# Patient Record
Sex: Female | Born: 1940
Health system: Southern US, Community
[De-identification: ages and names within clinical notes are randomized; demographics above are authoritative.]

## PROBLEM LIST (undated history)

## (undated) DIAGNOSIS — N959 Unspecified menopausal and perimenopausal disorder: Secondary | ICD-10-CM

## (undated) DIAGNOSIS — Z800 Family history of malignant neoplasm of digestive organs: Secondary | ICD-10-CM

## (undated) DIAGNOSIS — I493 Ventricular premature depolarization: Secondary | ICD-10-CM

## (undated) DIAGNOSIS — H269 Unspecified cataract: Secondary | ICD-10-CM

## (undated) DIAGNOSIS — M810 Age-related osteoporosis without current pathological fracture: Secondary | ICD-10-CM

## (undated) DIAGNOSIS — Z889 Allergy status to unspecified drugs, medicaments and biological substances status: Secondary | ICD-10-CM

## (undated) DIAGNOSIS — S82892A Other fracture of left lower leg, initial encounter for closed fracture: Secondary | ICD-10-CM

## (undated) DIAGNOSIS — I498 Other specified cardiac arrhythmias: Secondary | ICD-10-CM

## (undated) DIAGNOSIS — T7840XA Allergy, unspecified, initial encounter: Secondary | ICD-10-CM

## (undated) DIAGNOSIS — K5790 Diverticulosis of intestine, part unspecified, without perforation or abscess without bleeding: Secondary | ICD-10-CM

## (undated) DIAGNOSIS — K635 Polyp of colon: Secondary | ICD-10-CM

## (undated) DIAGNOSIS — I499 Cardiac arrhythmia, unspecified: Secondary | ICD-10-CM

## (undated) HISTORY — PX: CATARACT EXTRACTION W/ INTRAOCULAR LENS  IMPLANT, BILATERAL: SHX1307

## (undated) HISTORY — DX: Cardiac arrhythmia, unspecified: I49.9

## (undated) HISTORY — DX: Age-related osteoporosis without current pathological fracture: M81.0

## (undated) HISTORY — DX: Unspecified cataract: H26.9

## (undated) HISTORY — PX: APPENDECTOMY: SHX54

## (undated) HISTORY — DX: Other fracture of left lower leg, initial encounter for closed fracture: S82.892A

## (undated) HISTORY — DX: Allergy status to unspecified drugs, medicaments and biological substances: Z88.9

## (undated) HISTORY — DX: Ventricular premature depolarization: I49.3

## (undated) HISTORY — PX: OTHER SURGICAL HISTORY: SHX169

## (undated) HISTORY — DX: Allergy, unspecified, initial encounter: T78.40XA

## (undated) HISTORY — DX: Polyp of colon: K63.5

## (undated) HISTORY — DX: Unspecified menopausal and perimenopausal disorder: N95.9

## (undated) HISTORY — DX: Family history of malignant neoplasm of digestive organs: Z80.0

## (undated) HISTORY — DX: Other specified cardiac arrhythmias: I49.8

## (undated) HISTORY — DX: Diverticulosis of intestine, part unspecified, without perforation or abscess without bleeding: K57.90

---

## 1999-04-08 ENCOUNTER — Encounter: Payer: Self-pay | Admitting: Internal Medicine

## 1999-04-08 ENCOUNTER — Encounter: Admission: RE | Admit: 1999-04-08 | Discharge: 1999-04-08 | Payer: Self-pay | Admitting: Internal Medicine

## 1999-11-08 ENCOUNTER — Other Ambulatory Visit: Admission: RE | Admit: 1999-11-08 | Discharge: 1999-11-08 | Payer: Self-pay | Admitting: Internal Medicine

## 2000-04-11 ENCOUNTER — Encounter: Admission: RE | Admit: 2000-04-11 | Discharge: 2000-04-11 | Payer: Self-pay | Admitting: Internal Medicine

## 2000-04-11 ENCOUNTER — Encounter: Payer: Self-pay | Admitting: Internal Medicine

## 2001-04-16 ENCOUNTER — Encounter: Admission: RE | Admit: 2001-04-16 | Discharge: 2001-04-16 | Payer: Self-pay | Admitting: Internal Medicine

## 2001-04-16 ENCOUNTER — Encounter: Payer: Self-pay | Admitting: Internal Medicine

## 2002-04-22 ENCOUNTER — Ambulatory Visit (HOSPITAL_COMMUNITY): Admission: RE | Admit: 2002-04-22 | Discharge: 2002-04-22 | Payer: Self-pay | Admitting: Internal Medicine

## 2002-04-22 ENCOUNTER — Encounter: Payer: Self-pay | Admitting: Internal Medicine

## 2002-11-10 ENCOUNTER — Ambulatory Visit (HOSPITAL_COMMUNITY): Admission: RE | Admit: 2002-11-10 | Discharge: 2002-11-10 | Payer: Self-pay | Admitting: Gastroenterology

## 2002-11-10 ENCOUNTER — Encounter (INDEPENDENT_AMBULATORY_CARE_PROVIDER_SITE_OTHER): Payer: Self-pay | Admitting: Specialist

## 2003-05-27 ENCOUNTER — Ambulatory Visit (HOSPITAL_COMMUNITY): Admission: RE | Admit: 2003-05-27 | Discharge: 2003-05-27 | Payer: Self-pay | Admitting: Internal Medicine

## 2004-05-24 ENCOUNTER — Other Ambulatory Visit: Admission: RE | Admit: 2004-05-24 | Discharge: 2004-05-24 | Payer: Self-pay | Admitting: Internal Medicine

## 2004-06-01 ENCOUNTER — Ambulatory Visit (HOSPITAL_COMMUNITY): Admission: RE | Admit: 2004-06-01 | Discharge: 2004-06-01 | Payer: Self-pay | Admitting: Internal Medicine

## 2005-06-02 ENCOUNTER — Ambulatory Visit (HOSPITAL_COMMUNITY): Admission: RE | Admit: 2005-06-02 | Discharge: 2005-06-02 | Payer: Self-pay | Admitting: Internal Medicine

## 2006-06-06 ENCOUNTER — Ambulatory Visit (HOSPITAL_COMMUNITY): Admission: RE | Admit: 2006-06-06 | Discharge: 2006-06-06 | Payer: Self-pay | Admitting: *Deleted

## 2006-06-19 DIAGNOSIS — M810 Age-related osteoporosis without current pathological fracture: Secondary | ICD-10-CM

## 2006-06-19 HISTORY — DX: Age-related osteoporosis without current pathological fracture: M81.0

## 2006-06-19 LAB — HM DEXA SCAN

## 2006-08-01 ENCOUNTER — Ambulatory Visit: Payer: Self-pay | Admitting: Cardiovascular Disease

## 2006-08-02 ENCOUNTER — Ambulatory Visit (HOSPITAL_COMMUNITY): Admission: RE | Admit: 2006-08-02 | Discharge: 2006-08-02 | Payer: Self-pay | Admitting: Cardiology

## 2006-10-04 ENCOUNTER — Encounter: Admission: RE | Admit: 2006-10-04 | Discharge: 2006-10-04 | Payer: Self-pay | Admitting: *Deleted

## 2007-06-10 ENCOUNTER — Ambulatory Visit (HOSPITAL_COMMUNITY): Admission: RE | Admit: 2007-06-10 | Discharge: 2007-06-10 | Payer: Self-pay | Admitting: *Deleted

## 2007-07-01 ENCOUNTER — Other Ambulatory Visit: Admission: RE | Admit: 2007-07-01 | Discharge: 2007-07-01 | Payer: Self-pay | Admitting: *Deleted

## 2008-02-17 ENCOUNTER — Encounter: Admission: RE | Admit: 2008-02-17 | Discharge: 2008-02-17 | Payer: Self-pay | Admitting: Family Medicine

## 2008-06-10 ENCOUNTER — Ambulatory Visit (HOSPITAL_COMMUNITY): Admission: RE | Admit: 2008-06-10 | Discharge: 2008-06-10 | Payer: Self-pay | Admitting: Family Medicine

## 2008-06-23 ENCOUNTER — Encounter: Admission: RE | Admit: 2008-06-23 | Discharge: 2008-06-23 | Payer: Self-pay | Admitting: Family Medicine

## 2008-12-08 ENCOUNTER — Encounter: Admission: RE | Admit: 2008-12-08 | Discharge: 2008-12-08 | Payer: Self-pay | Admitting: Emergency Medicine

## 2009-06-24 ENCOUNTER — Ambulatory Visit (HOSPITAL_COMMUNITY): Admission: RE | Admit: 2009-06-24 | Discharge: 2009-06-24 | Payer: Self-pay | Admitting: Emergency Medicine

## 2010-02-22 ENCOUNTER — Inpatient Hospital Stay (HOSPITAL_COMMUNITY): Admission: EM | Admit: 2010-02-22 | Discharge: 2010-03-03 | Payer: Self-pay | Admitting: Emergency Medicine

## 2010-02-22 ENCOUNTER — Encounter (INDEPENDENT_AMBULATORY_CARE_PROVIDER_SITE_OTHER): Payer: Self-pay

## 2010-06-05 ENCOUNTER — Inpatient Hospital Stay (HOSPITAL_COMMUNITY): Admission: EM | Admit: 2010-06-05 | Discharge: 2010-06-11 | Payer: Self-pay | Source: Home / Self Care

## 2010-06-30 ENCOUNTER — Ambulatory Visit (HOSPITAL_COMMUNITY)
Admission: RE | Admit: 2010-06-30 | Discharge: 2010-06-30 | Payer: Self-pay | Source: Home / Self Care | Attending: Emergency Medicine | Admitting: Emergency Medicine

## 2010-08-29 LAB — BASIC METABOLIC PANEL
CO2: 29 mEq/L (ref 19–32)
Calcium: 8.5 mg/dL (ref 8.4–10.5)
Creatinine, Ser: 0.64 mg/dL (ref 0.4–1.2)
GFR calc Af Amer: >60 mL/min (ref >60)
GFR calc non Af Amer: >60 mL/min (ref >60)
Sodium: 141 mEq/L (ref 135–145)

## 2010-08-29 LAB — CBC
Hemoglobin: 11.6 g/dL — ABNORMAL LOW (ref 12.0–15.0)
Hemoglobin: 14.4 g/dL (ref 12.0–15.0)
MCH: 29.7 pg (ref 26.0–34.0)
MCHC: 32 g/dL (ref 30.0–36.0)
MCHC: 33.6 g/dL (ref 30.0–36.0)
Platelets: 207 10*3/uL (ref 150–400)
Platelets: 238 10*3/uL (ref 150–400)
RBC: 3.91 MIL/uL (ref 3.87–5.11)
RBC: 4.7 MIL/uL (ref 3.87–5.11)

## 2010-08-29 LAB — URINE MICROSCOPIC-ADD ON

## 2010-08-29 LAB — COMPREHENSIVE METABOLIC PANEL
ALT: 16 U/L (ref 0–35)
AST: 25 U/L (ref 0–37)
Albumin: 3.8 g/dL (ref 3.5–5.2)
CO2: 28 mEq/L (ref 19–32)
Calcium: 10 mg/dL (ref 8.4–10.5)
Creatinine, Ser: 0.89 mg/dL (ref 0.4–1.2)
GFR calc Af Amer: >60 mL/min (ref >60)
GFR calc non Af Amer: >60 mL/min (ref >60)
Sodium: 137 mEq/L (ref 135–145)

## 2010-08-29 LAB — DIFFERENTIAL
Eosinophils Absolute: 0.1 10*3/uL (ref 0.0–0.7)
Eosinophils Relative: 1 % (ref 0–5)
Lymphocytes Relative: 24 % (ref 12–46)
Lymphs Abs: 2.5 10*3/uL (ref 0.7–4.0)
Monocytes Absolute: 0.8 10*3/uL (ref 0.1–1.0)
Monocytes Relative: 8 % (ref 3–12)

## 2010-08-29 LAB — URINALYSIS, ROUTINE W REFLEX MICROSCOPIC
Bilirubin Urine: NEGATIVE
Glucose, UA: NEGATIVE mg/dL
Hgb urine dipstick: NEGATIVE
Ketones, ur: 15 mg/dL — AB
Nitrite: NEGATIVE
Specific Gravity, Urine: 1.013 (ref 1.005–1.030)
pH: 7 (ref 5.0–8.0)

## 2010-08-29 LAB — LIPASE, BLOOD: Lipase: 28 U/L (ref 11–59)

## 2010-08-29 LAB — PHOSPHORUS: Phosphorus: 2.9 mg/dL (ref 2.3–4.6)

## 2010-08-29 LAB — MAGNESIUM: Magnesium: 2 mg/dL (ref 1.5–2.5)

## 2010-09-01 LAB — URINALYSIS, ROUTINE W REFLEX MICROSCOPIC
Leukocytes, UA: NEGATIVE
Nitrite: NEGATIVE
Protein, ur: NEGATIVE mg/dL
Specific Gravity, Urine: 1.01 (ref 1.005–1.030)
Urobilinogen, UA: 0.2 mg/dL (ref 0.0–1.0)

## 2010-09-01 LAB — LIPASE, BLOOD: Lipase: 22 U/L (ref 11–59)

## 2010-09-01 LAB — COMPREHENSIVE METABOLIC PANEL
Albumin: 3.7 g/dL (ref 3.5–5.2)
BUN: 9 mg/dL (ref 6–23)
Calcium: 8.7 mg/dL (ref 8.4–10.5)
Chloride: 97 mEq/L (ref 96–112)
Creatinine, Ser: 0.76 mg/dL (ref 0.4–1.2)
GFR calc non Af Amer: >60 mL/min (ref >60)
Total Bilirubin: 1.6 mg/dL — ABNORMAL HIGH (ref 0.3–1.2)

## 2010-09-01 LAB — CBC
HCT: 33.7 % — ABNORMAL LOW (ref 36.0–46.0)
HCT: 34.2 % — ABNORMAL LOW (ref 36.0–46.0)
HCT: 34.3 % — ABNORMAL LOW (ref 36.0–46.0)
Hemoglobin: 11.4 g/dL — ABNORMAL LOW (ref 12.0–15.0)
Hemoglobin: 11.5 g/dL — ABNORMAL LOW (ref 12.0–15.0)
Hemoglobin: 12.5 g/dL (ref 12.0–15.0)
MCH: 30.3 pg (ref 26.0–34.0)
MCH: 30.7 pg (ref 26.0–34.0)
MCH: 30.9 pg (ref 26.0–34.0)
MCH: 31.2 pg (ref 26.0–34.0)
MCHC: 32.8 g/dL (ref 30.0–36.0)
MCHC: 33.2 g/dL (ref 30.0–36.0)
MCHC: 33.5 g/dL (ref 30.0–36.0)
MCHC: 34.1 g/dL (ref 30.0–36.0)
MCHC: 34.3 g/dL (ref 30.0–36.0)
MCV: 90.6 fL (ref 78.0–100.0)
MCV: 90.9 fL (ref 78.0–100.0)
MCV: 91.6 fL (ref 78.0–100.0)
MCV: 92.2 fL (ref 78.0–100.0)
Platelets: 476 10*3/uL — ABNORMAL HIGH (ref 150–400)
Platelets: 206 10*3/uL (ref 150–400)
Platelets: 330 10*3/uL (ref 150–400)
RBC: 3.71 MIL/uL — ABNORMAL LOW (ref 3.87–5.11)
RBC: 4.16 MIL/uL (ref 3.87–5.11)
RBC: 4.81 MIL/uL (ref 3.87–5.11)
RDW: 14.3 % (ref 11.5–15.5)
RDW: 13.8 % (ref 11.5–15.5)
RDW: 14.1 % (ref 11.5–15.5)
WBC: 6.1 10*3/uL (ref 4.0–10.5)
WBC: 11.6 10*3/uL — ABNORMAL HIGH (ref 4.0–10.5)

## 2010-09-01 LAB — BASIC METABOLIC PANEL
BUN: 2 mg/dL — ABNORMAL LOW (ref 6–23)
CO2: 24 mEq/L (ref 19–32)
CO2: 26 mEq/L (ref 19–32)
CO2: 27 mEq/L (ref 19–32)
Calcium: 6.8 mg/dL — ABNORMAL LOW (ref 8.4–10.5)
Calcium: 7.4 mg/dL — ABNORMAL LOW (ref 8.4–10.5)
Chloride: 107 mEq/L (ref 96–112)
Chloride: 107 mEq/L (ref 96–112)
Creatinine, Ser: 0.52 mg/dL (ref 0.4–1.2)
GFR calc Af Amer: >60 mL/min (ref >60)
GFR calc Af Amer: >60 mL/min (ref >60)
GFR calc non Af Amer: >60 mL/min (ref >60)
Glucose, Bld: 138 mg/dL — ABNORMAL HIGH (ref 70–99)
Glucose, Bld: 141 mg/dL — ABNORMAL HIGH (ref 70–99)
Potassium: 3.7 mEq/L (ref 3.5–5.1)
Sodium: 134 mEq/L — ABNORMAL LOW (ref 135–145)
Sodium: 136 mEq/L (ref 135–145)
Sodium: 137 mEq/L (ref 135–145)

## 2010-09-01 LAB — DIFFERENTIAL
Basophils Absolute: 0 10*3/uL (ref 0.0–0.1)
Lymphocytes Relative: 7 % — ABNORMAL LOW (ref 12–46)
Lymphs Abs: 0.9 10*3/uL (ref 0.7–4.0)
Monocytes Absolute: 0.8 10*3/uL (ref 0.1–1.0)
Neutro Abs: 10.9 10*3/uL — ABNORMAL HIGH (ref 1.7–7.7)

## 2010-09-01 LAB — URINE MICROSCOPIC-ADD ON

## 2010-09-01 LAB — GLUCOSE, CAPILLARY: Glucose-Capillary: 104 mg/dL — ABNORMAL HIGH (ref 70–99)

## 2010-09-12 ENCOUNTER — Other Ambulatory Visit: Payer: Self-pay | Admitting: Emergency Medicine

## 2010-09-12 DIAGNOSIS — M8580 Other specified disorders of bone density and structure, unspecified site: Secondary | ICD-10-CM

## 2010-11-04 NOTE — Op Note (Signed)
NAME:  Traci Woodard, Traci Woodard                         ACCOUNT NO.:  [id number]   MEDICAL RECORD NO.:  [id number]                   PATIENT TYPE:  AMB   LOCATION:  ENDO                                 FACILITY:  [facility]   PHYSICIAN:  Danise Edge, M.D.                DATE OF BIRTH:  Dec 01, 1940   DATE OF PROCEDURE:  11/10/2002  DATE OF DISCHARGE:                                 OPERATIVE REPORT   PROCEDURE PERFORMED:  Colonoscopy and polypectomy.   ENDOSCOPIST:  Charolett Bumpers, M.D.   INDICATIONS FOR PROCEDURE:  [name] is a 70 year old female born  01-30-1941.  [name] is scheduled to undergo a screening colonoscopy  with polypectomy to prevent colon cancer.  Approximately five years ago she  underwent a colonoscopy and a small neoplastic polyp was removed.   PREMEDICATION:  Versed 4 mg. Demerol 50 mg.   DESCRIPTION OF PROCEDURE:  After obtaining informed consent, [name] was  placed in the left lateral decubitus position.  I administered intravenous  Demerol and intravenous Versed to achieve conscious sedation for the  procedure.  The patient's blood pressure, oxygen saturations and cardiac  rhythm were monitored throughout the procedure and documented in the medical  record.   Anal inspection was normal.  Digital rectal examination was normal.  The  pediatric Olympus video colonoscope was introduced into the rectum and  easily advanced to the splenic flexure.  Due to sharp angulation of the  splenic flexure, the patient required placement in the prone position in  order to get around the splenic flexure and into the transverse colon.  She  was then placed in the supine position and the endoscope easily advanced to  the cecum.  A normal-appearing ileocecal valve was intubated and the distal  ileum inspected.  Colonic preparation for the exam today was excellent.   Rectum:  Normal.   Sigmoid colon and descending colon:  Normal.   Splenic flexure:  A 1 mm  sessile polyp was removed from the splenic flexure  with the hot biopsy forceps.   Transverse colon:  Normal.   Hepatic flexure:  Normal.   Descending colon:  From the distal ascending colon a 7 mm sessile polyp was  lifted by submucosal saline injection and removed in piecemeal fashion with  the electrocautery snare.   Cecum and ileocecal valve:  Normal.   Distal ileum:  Normal.   ASSESSMENT:  A 7 mm sessile polyp was removed from the distal ascending  colon and submitted for pathologic interpretation; a 1 mm sessile polyp was  removed from the splenic flexure and submitted for pathologic  interpretation.   PLAN:  Danise Edge, M.D.    MJ/MEDQ  D:  11/10/2002  T:  11/10/2002  Job:  [id number]   cc:   Darius Bump, M.D.  [phone] [address]  [place] [address]  Fax: [phone]

## 2010-12-12 ENCOUNTER — Ambulatory Visit
Admission: RE | Admit: 2010-12-12 | Discharge: 2010-12-12 | Disposition: A | Discharge status: Home / Self Care | Payer: PRIVATE HEALTH INSURANCE | Source: Ambulatory Visit | Attending: Emergency Medicine | Admitting: Emergency Medicine

## 2010-12-12 DIAGNOSIS — M8580 Other specified disorders of bone density and structure, unspecified site: Secondary | ICD-10-CM

## 2011-03-12 IMAGING — CT CT ABD-PELV W/ CM
1 of 3 series · 14 of 32 positions shown, 19 images · IV contrast (agent unspecified)
Comparison: 02/22/2010

CLINICAL DATA: Nausea, vomiting and abdominal pain.  Appendectomy
in [REDACTED].

CT ABDOMEN AND PELVIS WITH CONTRAST
TECHNIQUE: Multidetector CT imaging of the abdomen and pelvis was
performed following the standard protocol during bolus
administration of intravenous contrast.
Contrast: 100 ml Smnipaque-BSS IV

[Series 2: abd/pelv with 5.0 b31f st · axial · 0.85mm/px · z∈[-499,-129]mm · 14 of 84 slices shown, 19 images]
[im 5/84  soft-tissue]
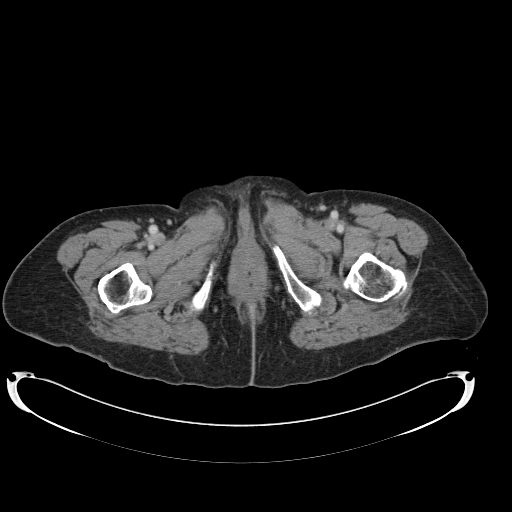
[im 5/84  bone]
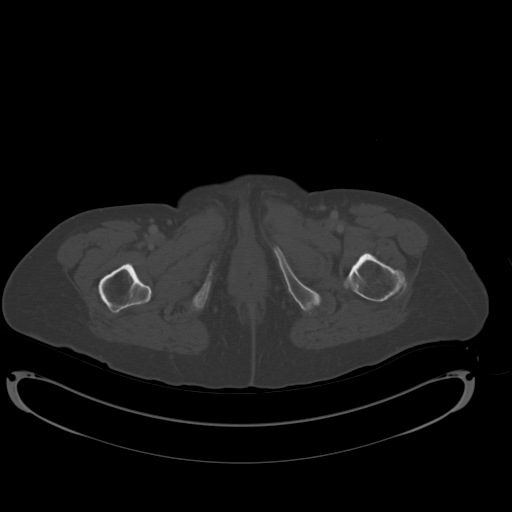
[im 14/84  soft-tissue]
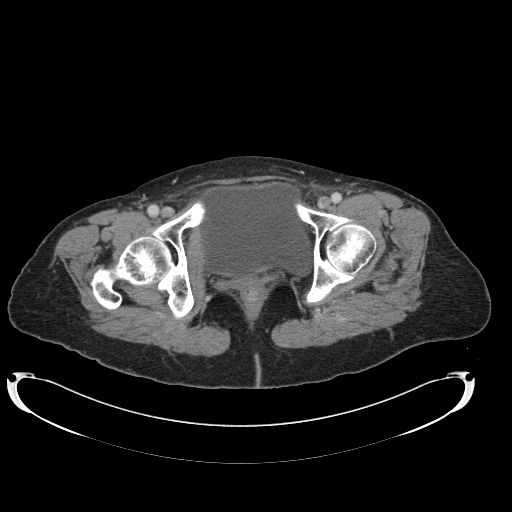
[im 18/84  soft-tissue]
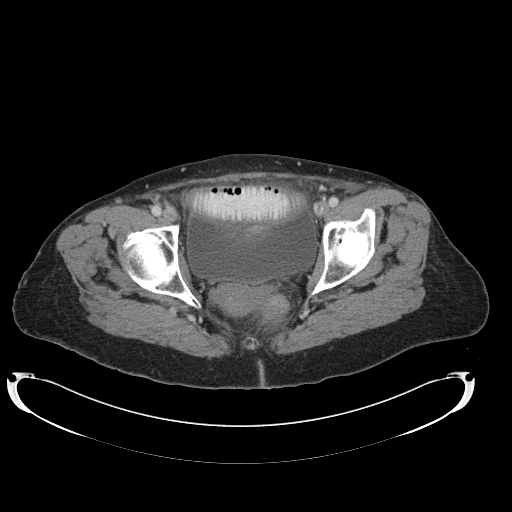
[im 22/84  soft-tissue]
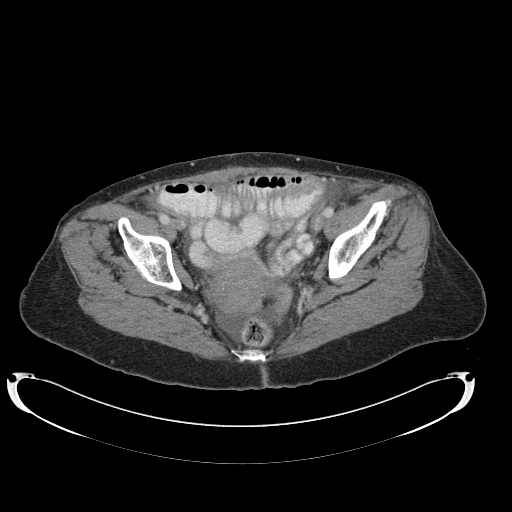
[im 31/84  soft-tissue]
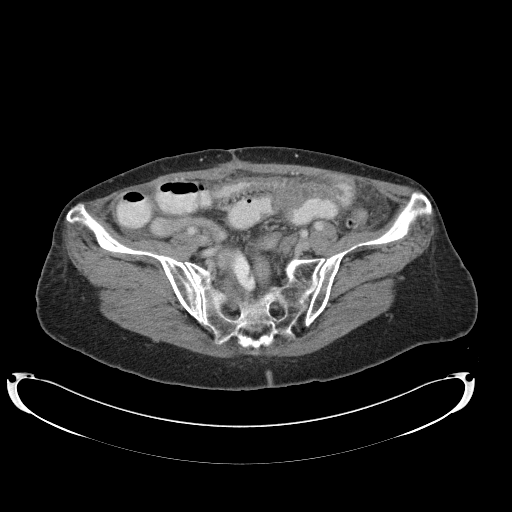
[im 35/84  soft-tissue]
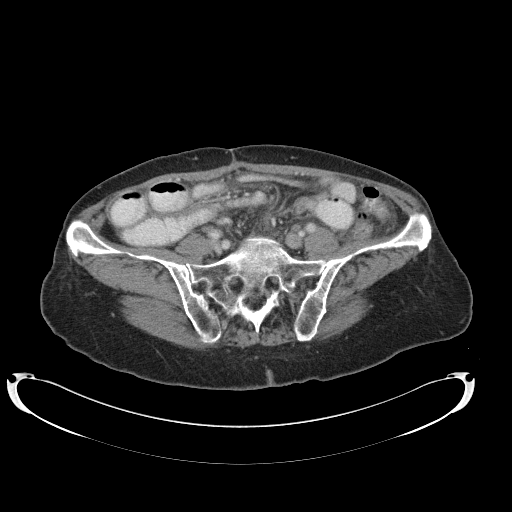
[im 44/84  soft-tissue]
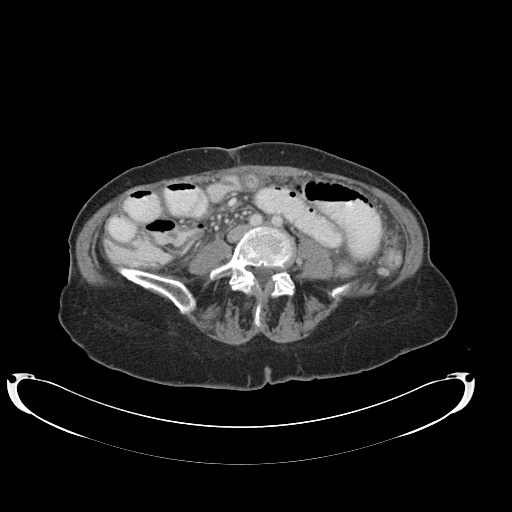
[im 49/84  soft-tissue]
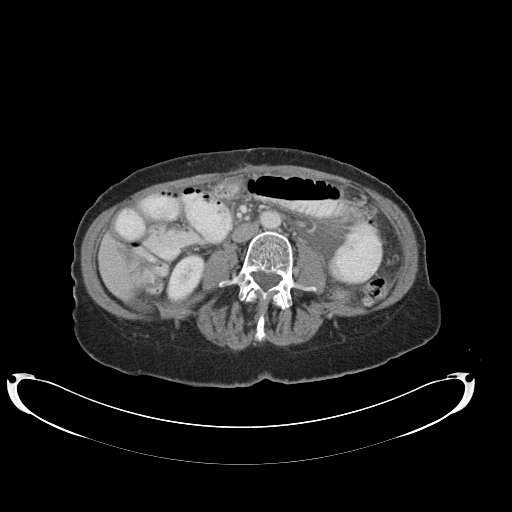
[im 53/84  soft-tissue]
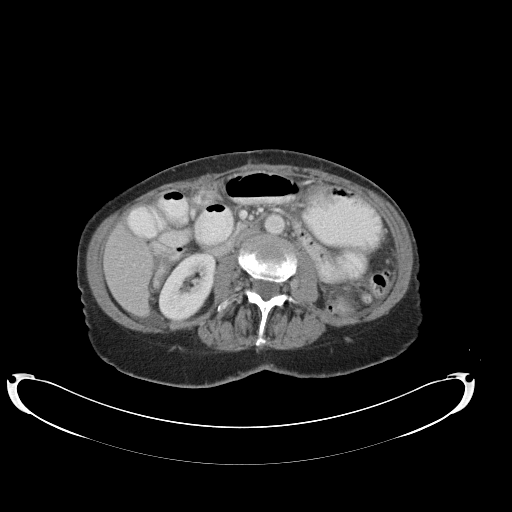
[im 53/84  bone]
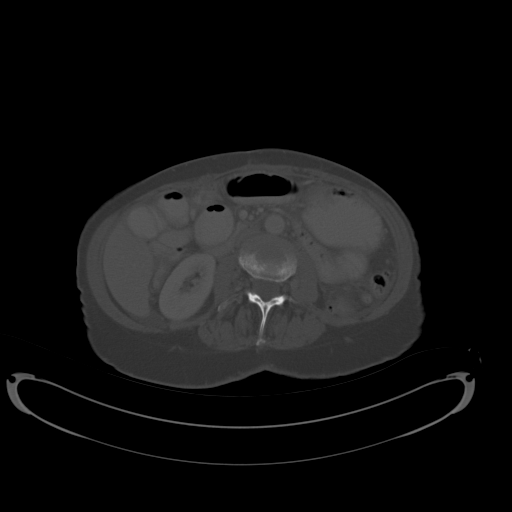
[im 62/84  soft-tissue]
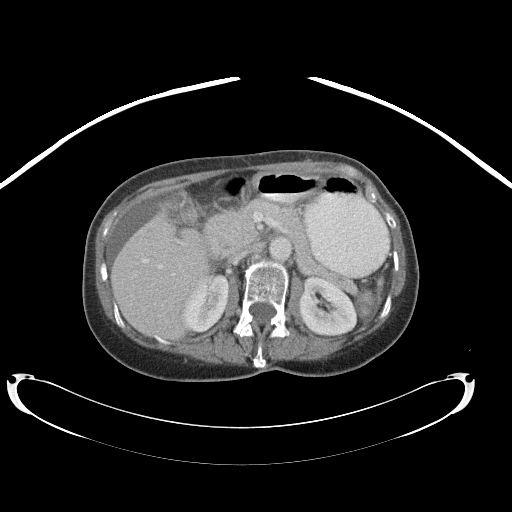
[im 66/84  soft-tissue]
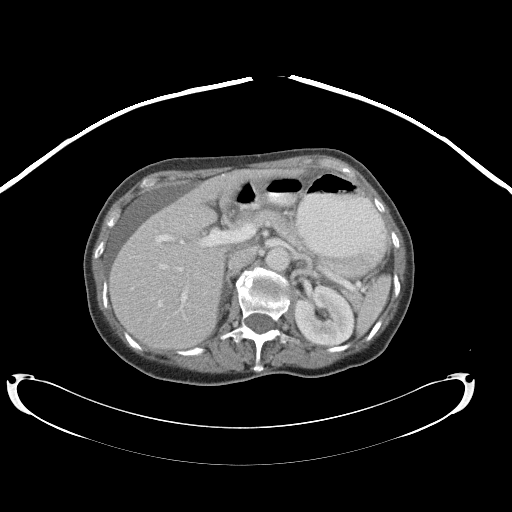
[im 66/84  lung]
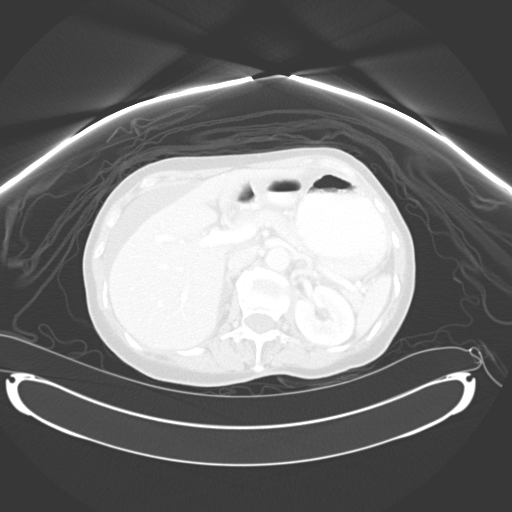
[im 70/84  soft-tissue]
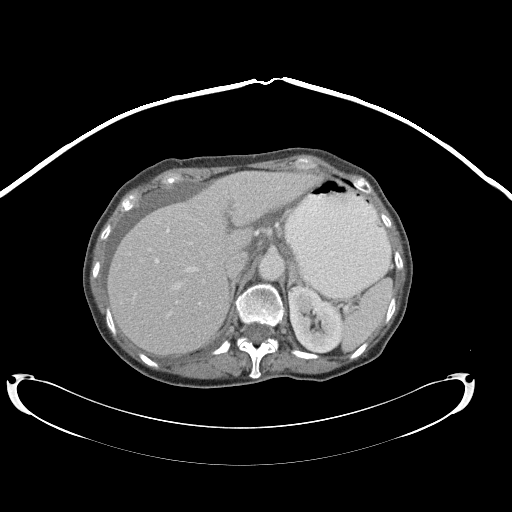
[im 70/84  lung]
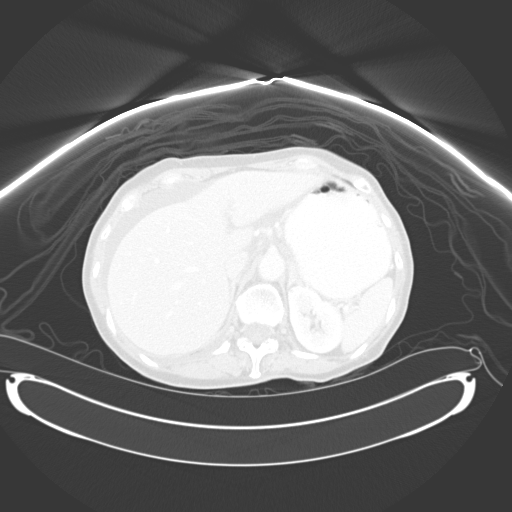
[im 75/84  lung]
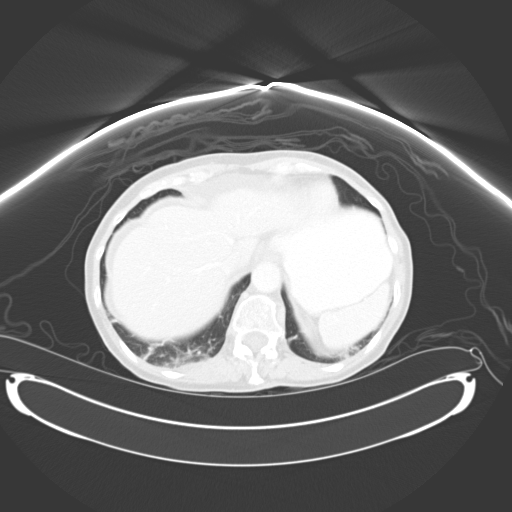
[im 79/84  soft-tissue]
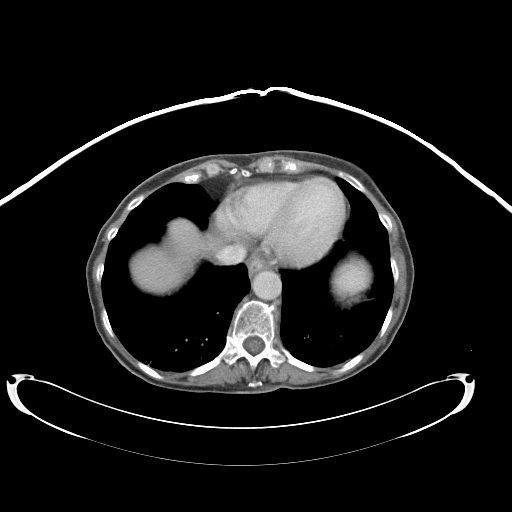
[im 79/84  lung]
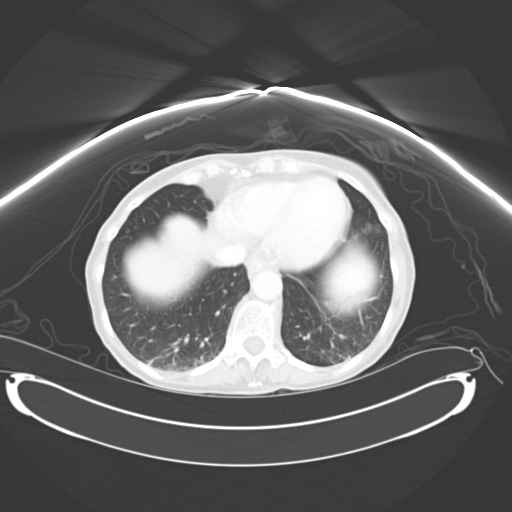

[14 of 32 positions shown; findings below may reference images not displayed]

FINDINGS: There is evidence of significant small bowel obstruction
with multiple dilated small bowel loops present primarily at the
level of the proximal to mid jejunum.  Maximal caliber of small
bowel approaches 4 cm.  Distal small bowel is of normal caliber.
Difficult to define exact transition point, but it is suspected to
be located in the lower midline pelvis.

Associated free fluid in the peritoneal cavity without focal
abscess.  No evidence of overt pneumatosis or free air.  No hernias
are identified.

The liver, gallbladder, pancreas, spleen, adrenal glands and
kidneys are unremarkable.  No abnormal calcifications.  The bladder
has a normal appearance.  Spondylosis and lumbar disc protrusions
present.
IMPRESSION: Significant small bowel obstruction with transition point at the
level of the mid to distal jejunum.  Associated free fluid in the
peritoneal cavity without focal abscess.  No evidence of bowel
perforation.

## 2011-03-12 IMAGING — CR DG ABDOMEN ACUTE W/ 1V CHEST
3 series · 3 of 3 positions shown · non-contrast
Comparison: None.

CLINICAL DATA: Lower abdominal pain, nausea, vomiting.

ACUTE ABDOMEN SERIES (ABDOMEN 2 VIEW & CHEST 1 VIEW)

[w chest pa]
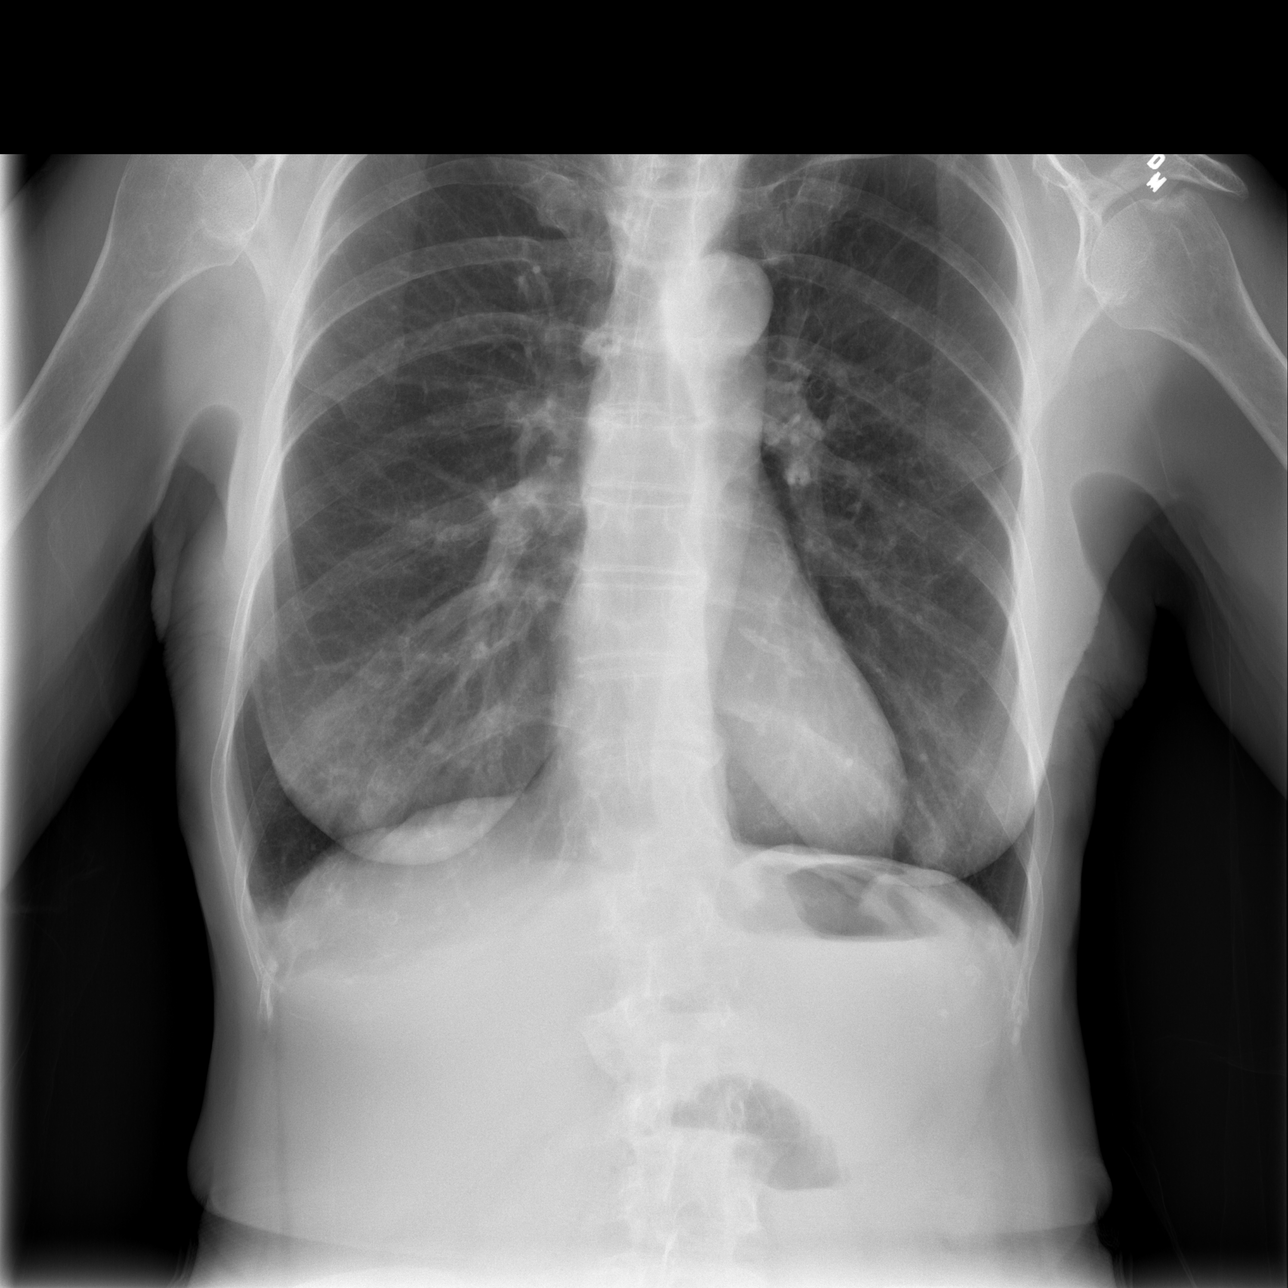

[w abdomen upright]
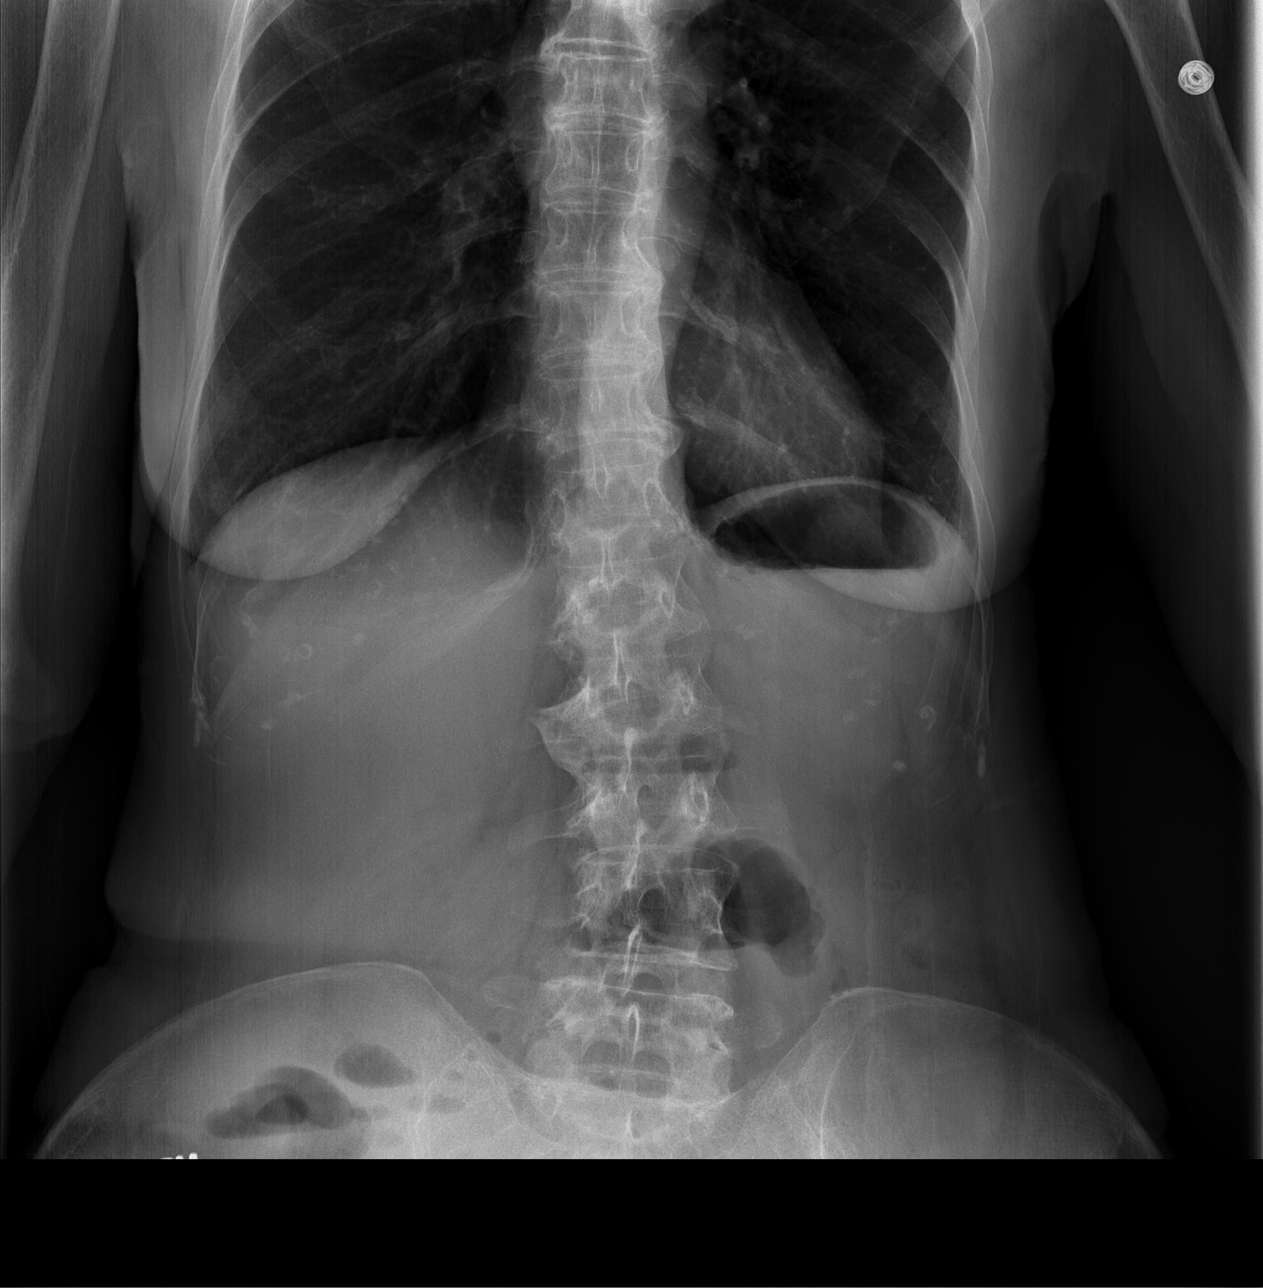

[t abdomen supine]
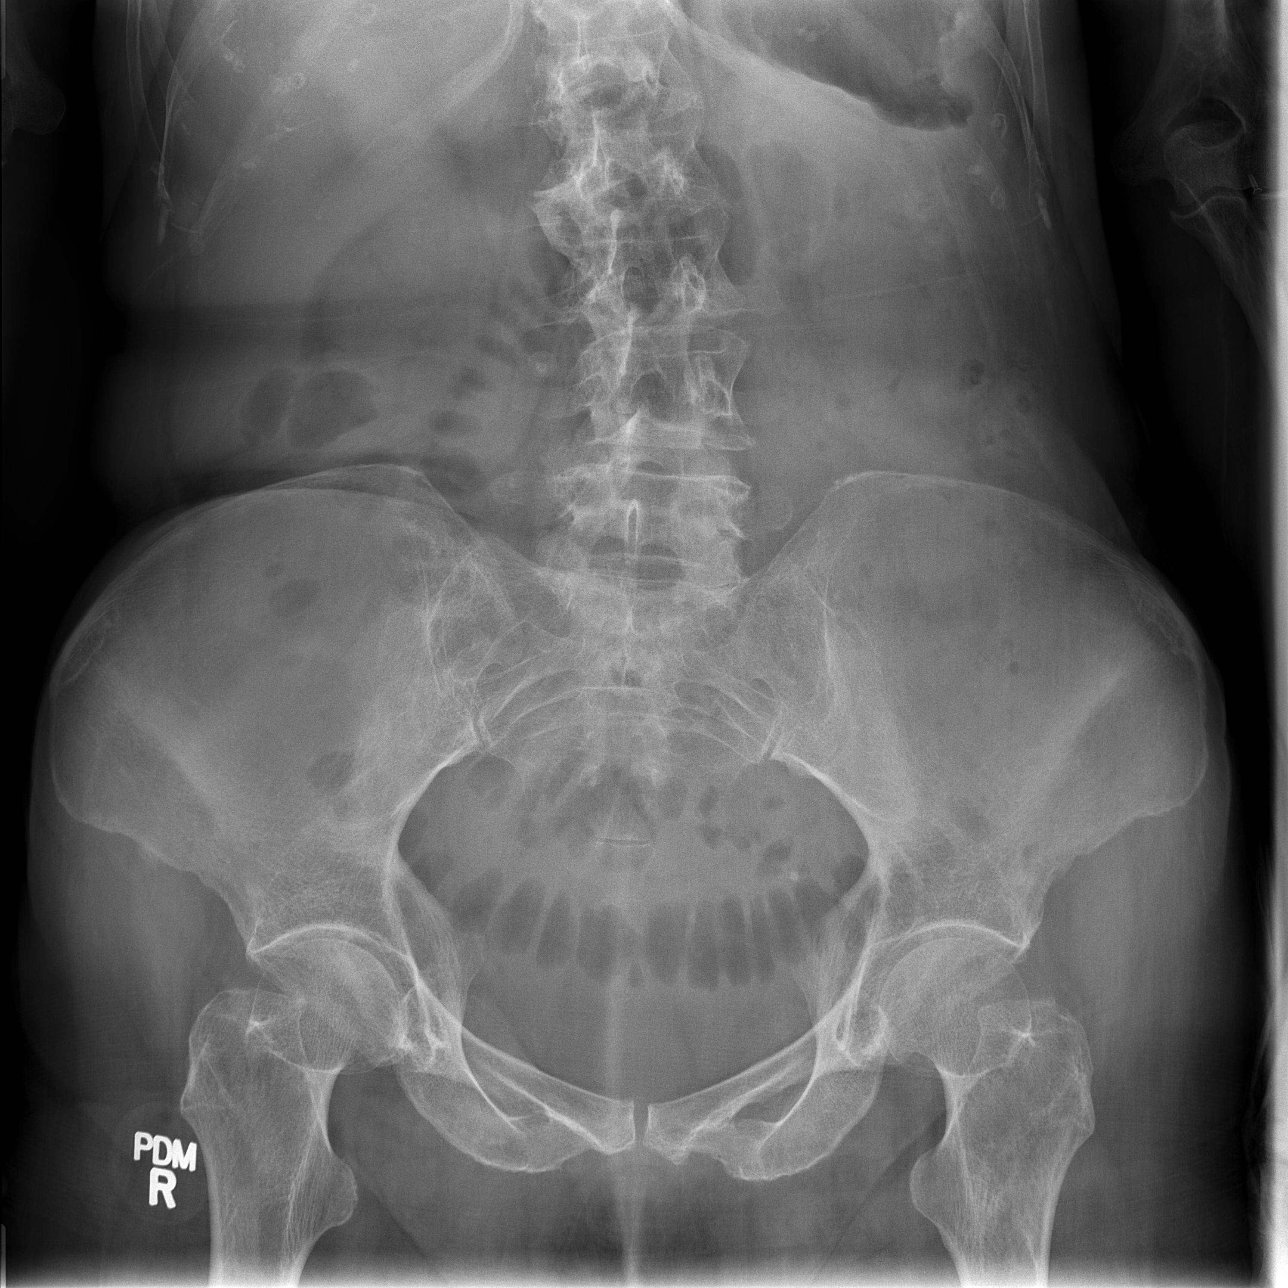

[3 of 3 positions shown; findings below may reference images not displayed]

FINDINGS: Heart mediastinal contours within normal limits.  There
is mild hyperinflation of the lungs.  No focal opacities or
effusions.

There is a nonspecific bowel gas pattern.  Single prominent loop of
small bowel noted in the mid abdomen.  Cannot exclude early small
bowel obstruction.  No free air or organomegaly.  Degenerative
changes in the lumbar spine and hips.
IMPRESSION: Nonspecific bowel gas pattern with single mildly prominent mid
abdominal small bowel loop.  Cannot exclude small bowel
obstruction.

Hyperinflation of the lungs.

## 2011-03-13 ENCOUNTER — Other Ambulatory Visit: Payer: Self-pay | Admitting: Emergency Medicine

## 2011-03-13 DIAGNOSIS — N63 Unspecified lump in unspecified breast: Secondary | ICD-10-CM

## 2011-03-20 ENCOUNTER — Ambulatory Visit
Admission: RE | Admit: 2011-03-20 | Discharge: 2011-03-20 | Disposition: A | Discharge status: Home / Self Care | Payer: PRIVATE HEALTH INSURANCE | Source: Ambulatory Visit | Attending: Emergency Medicine | Admitting: Emergency Medicine

## 2011-03-20 ENCOUNTER — Ambulatory Visit
Admission: RE | Admit: 2011-03-20 | Discharge: 2011-03-20 | Disposition: A | Discharge status: Home / Self Care | Payer: Medicare Other | Source: Ambulatory Visit | Attending: Emergency Medicine | Admitting: Emergency Medicine

## 2011-03-20 ENCOUNTER — Other Ambulatory Visit: Payer: Self-pay | Admitting: Emergency Medicine

## 2011-03-20 DIAGNOSIS — N63 Unspecified lump in unspecified breast: Secondary | ICD-10-CM

## 2011-05-25 ENCOUNTER — Other Ambulatory Visit (HOSPITAL_COMMUNITY): Payer: Self-pay | Admitting: Emergency Medicine

## 2011-05-25 ENCOUNTER — Other Ambulatory Visit: Payer: Self-pay | Admitting: Emergency Medicine

## 2011-05-25 DIAGNOSIS — Z1231 Encounter for screening mammogram for malignant neoplasm of breast: Secondary | ICD-10-CM

## 2011-07-03 ENCOUNTER — Ambulatory Visit (HOSPITAL_COMMUNITY)
Admission: RE | Admit: 2011-07-03 | Discharge: 2011-07-03 | Disposition: A | Discharge status: Home / Self Care | Payer: Medicare Other | Source: Ambulatory Visit | Attending: Emergency Medicine | Admitting: Emergency Medicine

## 2011-07-03 DIAGNOSIS — Z1231 Encounter for screening mammogram for malignant neoplasm of breast: Secondary | ICD-10-CM

## 2012-05-28 ENCOUNTER — Other Ambulatory Visit (HOSPITAL_COMMUNITY): Payer: Self-pay | Admitting: Family Medicine

## 2012-05-28 DIAGNOSIS — Z1231 Encounter for screening mammogram for malignant neoplasm of breast: Secondary | ICD-10-CM

## 2012-06-05 ENCOUNTER — Other Ambulatory Visit: Payer: Self-pay | Admitting: Gastroenterology

## 2012-06-05 DIAGNOSIS — K5730 Diverticulosis of large intestine without perforation or abscess without bleeding: Secondary | ICD-10-CM

## 2012-06-05 DIAGNOSIS — Z86010 Personal history of colonic polyps: Secondary | ICD-10-CM

## 2012-06-06 ENCOUNTER — Ambulatory Visit
Admission: RE | Admit: 2012-06-06 | Discharge: 2012-06-06 | Disposition: A | Discharge status: Home / Self Care | Payer: Medicare Other | Source: Ambulatory Visit | Attending: Gastroenterology | Admitting: Gastroenterology

## 2012-06-06 DIAGNOSIS — Z86010 Personal history of colonic polyps: Secondary | ICD-10-CM

## 2012-06-06 DIAGNOSIS — K5730 Diverticulosis of large intestine without perforation or abscess without bleeding: Secondary | ICD-10-CM

## 2012-07-11 ENCOUNTER — Ambulatory Visit (HOSPITAL_COMMUNITY)
Admission: RE | Admit: 2012-07-11 | Discharge: 2012-07-11 | Disposition: A | Discharge status: Home / Self Care | Payer: Medicare Other | Source: Ambulatory Visit | Attending: Family Medicine | Admitting: Family Medicine

## 2012-07-11 DIAGNOSIS — Z1231 Encounter for screening mammogram for malignant neoplasm of breast: Secondary | ICD-10-CM | POA: Insufficient documentation

## 2012-07-15 ENCOUNTER — Other Ambulatory Visit: Payer: Self-pay | Admitting: Family Medicine

## 2012-07-15 DIAGNOSIS — R928 Other abnormal and inconclusive findings on diagnostic imaging of breast: Secondary | ICD-10-CM

## 2012-07-24 ENCOUNTER — Ambulatory Visit
Admission: RE | Admit: 2012-07-24 | Discharge: 2012-07-24 | Disposition: A | Discharge status: Home / Self Care | Payer: Medicare Other | Source: Ambulatory Visit | Attending: Family Medicine | Admitting: Family Medicine

## 2012-07-24 DIAGNOSIS — R928 Other abnormal and inconclusive findings on diagnostic imaging of breast: Secondary | ICD-10-CM

## 2013-07-01 ENCOUNTER — Other Ambulatory Visit: Payer: Self-pay

## 2013-07-01 DIAGNOSIS — Z1231 Encounter for screening mammogram for malignant neoplasm of breast: Secondary | ICD-10-CM

## 2013-07-25 ENCOUNTER — Ambulatory Visit
Admission: RE | Admit: 2013-07-25 | Discharge: 2013-07-25 | Disposition: A | Discharge status: Home / Self Care | Payer: PRIVATE HEALTH INSURANCE | Source: Ambulatory Visit

## 2013-07-25 DIAGNOSIS — Z1231 Encounter for screening mammogram for malignant neoplasm of breast: Secondary | ICD-10-CM | POA: Diagnosis not present

## 2013-07-30 DIAGNOSIS — H26499 Other secondary cataract, unspecified eye: Secondary | ICD-10-CM | POA: Diagnosis not present

## 2013-07-30 DIAGNOSIS — H35369 Drusen (degenerative) of macula, unspecified eye: Secondary | ICD-10-CM | POA: Diagnosis not present

## 2013-07-30 DIAGNOSIS — Q150 Congenital glaucoma: Secondary | ICD-10-CM | POA: Diagnosis not present

## 2013-09-15 ENCOUNTER — Encounter: Payer: Self-pay | Admitting: Internal Medicine

## 2013-09-22 ENCOUNTER — Other Ambulatory Visit: Payer: Self-pay | Admitting: Family Medicine

## 2013-09-22 DIAGNOSIS — M810 Age-related osteoporosis without current pathological fracture: Secondary | ICD-10-CM

## 2013-09-22 DIAGNOSIS — Z23 Encounter for immunization: Secondary | ICD-10-CM | POA: Diagnosis not present

## 2013-09-22 DIAGNOSIS — Z0000 Encounter for general adult medical examination without abnormal findings: Secondary | ICD-10-CM | POA: Diagnosis not present

## 2013-10-02 ENCOUNTER — Ambulatory Visit
Admission: RE | Admit: 2013-10-02 | Discharge: 2013-10-02 | Disposition: A | Discharge status: Home / Self Care | Payer: PRIVATE HEALTH INSURANCE | Source: Ambulatory Visit | Attending: Family Medicine | Admitting: Family Medicine

## 2013-10-02 DIAGNOSIS — M810 Age-related osteoporosis without current pathological fracture: Secondary | ICD-10-CM

## 2013-10-02 LAB — HM DEXA SCAN

## 2013-10-06 ENCOUNTER — Ambulatory Visit (HOSPITAL_COMMUNITY): Payer: Medicare Other | Attending: Cardiology | Admitting: Radiology

## 2013-10-06 ENCOUNTER — Encounter: Payer: Self-pay | Admitting: Cardiology

## 2013-10-06 ENCOUNTER — Other Ambulatory Visit (HOSPITAL_COMMUNITY): Payer: Self-pay | Admitting: Radiology

## 2013-10-06 DIAGNOSIS — I359 Nonrheumatic aortic valve disorder, unspecified: Secondary | ICD-10-CM | POA: Diagnosis not present

## 2013-10-06 DIAGNOSIS — R9431 Abnormal electrocardiogram [ECG] [EKG]: Secondary | ICD-10-CM

## 2013-10-06 NOTE — Progress Notes (Signed)
Echocardiogram Performed. 

## 2013-10-16 DIAGNOSIS — I4949 Other premature depolarization: Secondary | ICD-10-CM | POA: Diagnosis not present

## 2013-10-16 DIAGNOSIS — I359 Nonrheumatic aortic valve disorder, unspecified: Secondary | ICD-10-CM | POA: Diagnosis not present

## 2013-10-16 DIAGNOSIS — I491 Atrial premature depolarization: Secondary | ICD-10-CM | POA: Diagnosis not present

## 2013-11-26 DIAGNOSIS — J0100 Acute maxillary sinusitis, unspecified: Secondary | ICD-10-CM | POA: Diagnosis not present

## 2014-05-05 DIAGNOSIS — Z23 Encounter for immunization: Secondary | ICD-10-CM | POA: Diagnosis not present

## 2014-06-19 LAB — HM COLONOSCOPY: HM Colonoscopy: NORMAL

## 2014-07-01 ENCOUNTER — Other Ambulatory Visit: Payer: Self-pay

## 2014-07-01 DIAGNOSIS — Z1231 Encounter for screening mammogram for malignant neoplasm of breast: Secondary | ICD-10-CM

## 2014-07-27 ENCOUNTER — Ambulatory Visit
Admission: RE | Admit: 2014-07-27 | Discharge: 2014-07-27 | Disposition: A | Discharge status: Home / Self Care | Payer: Medicare Other | Source: Ambulatory Visit

## 2014-07-27 DIAGNOSIS — Z1231 Encounter for screening mammogram for malignant neoplasm of breast: Secondary | ICD-10-CM | POA: Diagnosis not present

## 2014-08-04 DIAGNOSIS — H02403 Unspecified ptosis of bilateral eyelids: Secondary | ICD-10-CM | POA: Diagnosis not present

## 2014-08-04 DIAGNOSIS — H52203 Unspecified astigmatism, bilateral: Secondary | ICD-10-CM | POA: Diagnosis not present

## 2014-08-04 DIAGNOSIS — Z961 Presence of intraocular lens: Secondary | ICD-10-CM | POA: Diagnosis not present

## 2014-08-04 DIAGNOSIS — H21231 Degeneration of iris (pigmentary), right eye: Secondary | ICD-10-CM | POA: Diagnosis not present

## 2014-08-14 DIAGNOSIS — K921 Melena: Secondary | ICD-10-CM | POA: Diagnosis not present

## 2014-08-18 DIAGNOSIS — K922 Gastrointestinal hemorrhage, unspecified: Secondary | ICD-10-CM | POA: Diagnosis not present

## 2014-08-31 DIAGNOSIS — H5711 Ocular pain, right eye: Secondary | ICD-10-CM | POA: Diagnosis not present

## 2014-09-24 DIAGNOSIS — M810 Age-related osteoporosis without current pathological fracture: Secondary | ICD-10-CM | POA: Diagnosis not present

## 2014-09-24 DIAGNOSIS — Z86010 Personal history of colonic polyps: Secondary | ICD-10-CM | POA: Diagnosis not present

## 2014-09-24 DIAGNOSIS — K635 Polyp of colon: Secondary | ICD-10-CM | POA: Diagnosis not present

## 2014-09-24 DIAGNOSIS — D120 Benign neoplasm of cecum: Secondary | ICD-10-CM | POA: Diagnosis not present

## 2014-09-24 DIAGNOSIS — I499 Cardiac arrhythmia, unspecified: Secondary | ICD-10-CM | POA: Diagnosis not present

## 2014-09-24 DIAGNOSIS — D123 Benign neoplasm of transverse colon: Secondary | ICD-10-CM | POA: Diagnosis not present

## 2014-09-24 DIAGNOSIS — D122 Benign neoplasm of ascending colon: Secondary | ICD-10-CM | POA: Diagnosis not present

## 2014-09-24 DIAGNOSIS — Z79899 Other long term (current) drug therapy: Secondary | ICD-10-CM | POA: Diagnosis not present

## 2014-09-24 DIAGNOSIS — Z1211 Encounter for screening for malignant neoplasm of colon: Secondary | ICD-10-CM | POA: Diagnosis not present

## 2014-09-24 DIAGNOSIS — K5730 Diverticulosis of large intestine without perforation or abscess without bleeding: Secondary | ICD-10-CM | POA: Diagnosis not present

## 2014-09-24 LAB — HM COLONOSCOPY: HM Colonoscopy: NORMAL

## 2014-09-25 DIAGNOSIS — D123 Benign neoplasm of transverse colon: Secondary | ICD-10-CM | POA: Diagnosis not present

## 2014-09-25 DIAGNOSIS — D120 Benign neoplasm of cecum: Secondary | ICD-10-CM | POA: Diagnosis not present

## 2014-10-29 LAB — TSH: TSH: 1.84 u[IU]/mL (ref 0.41–5.90)

## 2014-10-29 LAB — BASIC METABOLIC PANEL
BUN: 18 mg/dL (ref 4–21)
CREATININE: 0.7 mg/dL (ref 0.5–1.1)
GLUCOSE: 98 mg/dL
POTASSIUM: 4.3 mmol/L (ref 3.4–5.3)
SODIUM: 143 mmol/L (ref 137–147)

## 2014-10-29 LAB — CBC AND DIFFERENTIAL
HEMATOCRIT: 38 % (ref 36–46)
HEMOGLOBIN: 12.1 g/dL (ref 12.0–16.0)
Neutrophils Absolute: 2 /uL
Platelets: 179 10*3/uL (ref 150–399)
WBC: 3.9 10^3/mL

## 2014-10-29 LAB — LIPID PANEL
Cholesterol: 227 mg/dL — AB (ref 0–200)
HDL: 97 mg/dL — AB (ref 35–70)
LDL Cholesterol: 118 mg/dL
TRIGLYCERIDES: 60 mg/dL (ref 40–160)

## 2014-10-29 LAB — HEMOGLOBIN A1C: Hemoglobin A1C: 5.4

## 2014-10-29 LAB — HEPATIC FUNCTION PANEL: Bilirubin, Total: 0.5 mg/dL

## 2014-11-06 DIAGNOSIS — Z0000 Encounter for general adult medical examination without abnormal findings: Secondary | ICD-10-CM | POA: Diagnosis not present

## 2014-11-06 DIAGNOSIS — Z008 Encounter for other general examination: Secondary | ICD-10-CM | POA: Diagnosis not present

## 2014-11-06 DIAGNOSIS — J309 Allergic rhinitis, unspecified: Secondary | ICD-10-CM | POA: Diagnosis not present

## 2014-11-06 DIAGNOSIS — I493 Ventricular premature depolarization: Secondary | ICD-10-CM | POA: Diagnosis not present

## 2014-11-06 DIAGNOSIS — Z86010 Personal history of colonic polyps: Secondary | ICD-10-CM | POA: Diagnosis not present

## 2015-03-29 DIAGNOSIS — L821 Other seborrheic keratosis: Secondary | ICD-10-CM | POA: Diagnosis not present

## 2015-05-20 DIAGNOSIS — Z23 Encounter for immunization: Secondary | ICD-10-CM | POA: Diagnosis not present

## 2015-06-20 LAB — HM MAMMOGRAPHY: HM MAMMO: NORMAL (ref 0–4)

## 2015-07-02 ENCOUNTER — Other Ambulatory Visit: Payer: Self-pay

## 2015-07-02 DIAGNOSIS — Z1231 Encounter for screening mammogram for malignant neoplasm of breast: Secondary | ICD-10-CM

## 2015-07-29 ENCOUNTER — Ambulatory Visit
Admission: RE | Admit: 2015-07-29 | Discharge: 2015-07-29 | Disposition: A | Discharge status: Home / Self Care | Payer: Medicare Other | Source: Ambulatory Visit

## 2015-07-29 DIAGNOSIS — Z1231 Encounter for screening mammogram for malignant neoplasm of breast: Secondary | ICD-10-CM | POA: Diagnosis not present

## 2015-07-30 ENCOUNTER — Other Ambulatory Visit: Payer: Self-pay | Admitting: Family Medicine

## 2015-07-30 DIAGNOSIS — R928 Other abnormal and inconclusive findings on diagnostic imaging of breast: Secondary | ICD-10-CM

## 2015-08-09 ENCOUNTER — Ambulatory Visit
Admission: RE | Admit: 2015-08-09 | Discharge: 2015-08-09 | Disposition: A | Discharge status: Home / Self Care | Payer: Medicare Other | Source: Ambulatory Visit | Attending: Family Medicine | Admitting: Family Medicine

## 2015-08-09 DIAGNOSIS — Z961 Presence of intraocular lens: Secondary | ICD-10-CM | POA: Diagnosis not present

## 2015-08-09 DIAGNOSIS — R928 Other abnormal and inconclusive findings on diagnostic imaging of breast: Secondary | ICD-10-CM

## 2015-08-09 DIAGNOSIS — H35363 Drusen (degenerative) of macula, bilateral: Secondary | ICD-10-CM | POA: Diagnosis not present

## 2015-08-09 DIAGNOSIS — H02403 Unspecified ptosis of bilateral eyelids: Secondary | ICD-10-CM | POA: Diagnosis not present

## 2015-08-18 LAB — HM MAMMOGRAPHY

## 2015-11-16 ENCOUNTER — Encounter: Payer: Self-pay | Admitting: Internal Medicine

## 2015-11-17 ENCOUNTER — Encounter: Payer: Self-pay | Admitting: Internal Medicine

## 2015-11-17 ENCOUNTER — Non-Acute Institutional Stay: Payer: Medicare Other | Admitting: Internal Medicine

## 2015-11-17 VITALS — BP 112/70 | HR 67 | Temp 98.1°F | Ht 63.0 in | Wt 128.0 lb

## 2015-11-17 DIAGNOSIS — K660 Peritoneal adhesions (postprocedural) (postinfection): Secondary | ICD-10-CM

## 2015-11-17 DIAGNOSIS — Z860100 Personal history of colon polyps, unspecified: Secondary | ICD-10-CM

## 2015-11-17 DIAGNOSIS — J309 Allergic rhinitis, unspecified: Secondary | ICD-10-CM

## 2015-11-17 DIAGNOSIS — I10 Essential (primary) hypertension: Secondary | ICD-10-CM | POA: Diagnosis not present

## 2015-11-17 DIAGNOSIS — M810 Age-related osteoporosis without current pathological fracture: Secondary | ICD-10-CM

## 2015-11-17 DIAGNOSIS — E785 Hyperlipidemia, unspecified: Secondary | ICD-10-CM | POA: Diagnosis not present

## 2015-11-17 DIAGNOSIS — I493 Ventricular premature depolarization: Secondary | ICD-10-CM

## 2015-11-17 NOTE — Progress Notes (Signed)
Provider:  Rexene Edison. Mariea Clonts, D.O., C.M.D. Location:  Occupational psychologist of Service:  Clinic (12)  PCP: Hollace Kinnier, DO Patient Care Team: Gayland Curry, DO as PCP - General (Geriatric Medicine) Druscilla Brownie, MD as Consulting Physician (Dermatology) Garlan Fair, MD as Consulting Physician (Gastroenterology)  Extended Emergency Contact Information Primary Emergency Contact: Covin,Doug Address: [address] [place], [place] [address] Johnnette Litter of [place] Phone: [phone] Mobile Phone: [phone] Relation: Spouse  Code Status: DNR Goals of Care: Advanced Directive information Advanced Directives 11/17/2015  Does patient have an advance directive? Yes  Copy of advanced directive(s) in chart? No - copy requested  has appt with Mliss Sax coming up  Chief Complaint  Patient presents with  . Establish Care    new patient    HPI: Patient is a 75 y.o. female seen today to establish with [place] [facility].  Her husband already sees me.    Says she is doing great.  Thinks being here at [place] has helped a lot.  BP was great again.  She'd had some trouble with her blood pressure for a while.  Has seen Dr. Radford Pax cardiology--she was having terrible PACs and PVCs.  She could not calm down.  EKGs showed these.  Pt thinks it's all better.  Has not had ekg this year.    She has no pain.  Has no complaints.    Colon polyps:  Had last cscope at [facility] in 2016--has history of adhesions from peritonitis from appendicitis in 2011.  Appendix had been burst for 8 days and had several things she was working on.  Did not feel the pain b/c she was so stressed out.  Dr. Zella Richer did the surgery--was in hospital for 10 days.  Then got bowel blockage in Dec 2011 and was hospitalized for 8 days (SBO due to adhesions).  Can eat normally now.  Hydrates well, has good BMs.  Has special cscope--Dr. McGill [facility] at [facility]  (female).  Is on singulair for her allergies.  antronex dietary supplement.  Was given this by Johny Blamer who saw her for her knee.  She's not sure what it is and it seems to help her allergies.  Wears a mask outside and has a filter inside.    Senile osteoporosis:  Uses ca with D and additional D for osteoporosis.  Has in the past taken fosamax briefly.  Took boniva also but discontinued.  Total of about 8 years on bisphosphonates.  Quit a couple of years ago.  2008 was last bone density study.    Walks and stretches daily.  Admits she's been slack.  Huge volume of stuff has ceased now.  Also enjoys playing music.     Last labs at annual wellness exam in May 2016.    Past Medical History  Diagnosis Date  . Osteoporosis 2008    PER DEXA SCAN  . PVC (premature ventricular contraction)   . Multiple allergies   . Colon polyps   . FH: colon cancer   . Diverticulosis   . Bigeminy   . Ankle fracture, left   . Menopausal and perimenopausal disorder   . Allergy   . Cataract    Past Surgical History  Procedure Laterality Date  . Small bowel obstruction    . Appendectomy    . Perforated appendix    . Cataract extraction w/ intraocular lens  implant, bilateral  reports that she has never smoked. She has never used smokeless tobacco. She reports that she drinks alcohol. She reports that she does not use illicit drugs. Social History   Social History  . Marital Status: Married    Spouse Name: N/A  . Number of Children: N/A  . Years of Education: N/A   Occupational History  . Retired     Tourist information centre manager   Social History Main Topics  . Smoking status: Never Smoker   . Smokeless tobacco: Never Used  . Alcohol Use: Yes  . Drug Use: No  . Sexual Activity: Not on file   Other Topics Concern  . Not on file   Social History Narrative   Tobacco use, amount per day now:0   Past tobacco use, amount per day:0   How many years did you use tobacco:0   Alcohol use (drinks per week):7-14    Diet:FRUITS, VEGETABLES, YOGURT, LEAN MEAT AND FISH, FEW BREADS AND DESSERTS    Do you drink/eat things with caffeine:YES, COFFEE, CHOCOLATE   Marital status: MARRIED                               What year were you married? 1980   Do you live in a house, apartment, assisted living, condo, trailer, etc.? [place]   Is it one or more stories? ONE   How many persons live in your home? 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   YES                               Type and how often? WALK AND STRETCH DAILY   Do you have a living will? YES   Do you have a DNR form?  I THINK SO              If not, do you want to discuss one?   Do you have signed POA/HPOA forms?  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  Was her favorite job.  Functional Status Survey: Is the patient deaf or have difficulty hearing?: No Does the patient have difficulty seeing, even when wearing glasses/contacts?: No Does the patient have difficulty concentrating, remembering, or making decisions?: No Does the patient have difficulty walking or climbing stairs?: No Does the patient have difficulty dressing or bathing?: No Does the patient have difficulty doing errands alone such as visiting a doctor's office or shopping?: No  Family History  Problem Relation Age of Onset  . Colon cancer Mother   . CAD Father   . Dementia Father   . Heart disease Father   . Colon cancer Brother     Health Maintenance  Topic Date Due  . INFLUENZA VACCINE  01/18/2016  . MAMMOGRAM  07/28/2016  . COLONOSCOPY  09/23/2017  . TETANUS/TDAP  09/17/2021  . DEXA SCAN  Completed  . ZOSTAVAX   Completed  .  PNA vac Low Risk Adult  Completed    Allergies  Allergen Reactions  . Pollen Extract       Medication List       This list is accurate as of: 11/17/15 11:22 AM.  Always use your most recent med list.               Calcium Carb-Ergocalciferol 500-200 MG-UNIT Tabs  Take 2 tablets by mouth daily.     montelukast 10 MG tablet  Commonly known as:  SINGULAIR  Take 10 mg by mouth at bedtime.     NON FORMULARY  Antronex dietary supplement 1 per day     Vitamin D3 2000 units capsule  Take 2,000 Units by mouth every other day.        Review of Systems  Constitutional: Negative for fever, chills and malaise/fatigue.  HENT: Positive for congestion. Negative for hearing loss and sore throat.   Eyes:       Wears glasses, prior cataract surgery  Respiratory: Negative for cough, sputum production, shortness of breath and wheezing.   Cardiovascular: Negative for chest pain, palpitations and leg swelling.  Gastrointestinal: Negative for heartburn, abdominal pain, diarrhea, constipation, blood in stool and melena.  Genitourinary: Negative for dysuria, urgency and frequency.  Musculoskeletal: Positive for joint pain. Negative for falls.       Knee bothered her at one point, but not recently, was seeing chiropractor for it  Skin: Negative for rash.  Neurological: Negative for dizziness, loss of consciousness, weakness and headaches.  Endo/Heme/Allergies: Does not bruise/bleed easily.  Psychiatric/Behavioral: Negative for depression and memory loss. The patient is nervous/anxious. The patient does not have insomnia.     Filed Vitals:   11/17/15 1007  BP: 112/70  Pulse: 67  Temp: 98.1 F (36.7 C)  TempSrc: Oral  Height: 5\' 3"  (1.6 m)  Weight: 128 lb (58.06 kg)  SpO2: 97%   Body mass index is 22.68 kg/(m^2). Physical Exam  Constitutional: She is oriented to person, place, and time. She appears well-developed and well-nourished. No distress.  HENT:  Head:  Normocephalic and atraumatic.  Eyes: EOM are normal. Pupils are equal, round, and reactive to light.  Glasses for reading but wears all of the time for convenience  Neck: Normal range of motion. Neck supple.  Cardiovascular: Normal rate, regular rhythm, normal heart sounds and intact distal pulses.  Exam reveals no gallop and no friction rub.   No murmur heard. Pulmonary/Chest: Effort normal and breath sounds normal. No respiratory distress. She has no wheezes.  Abdominal: Soft. Bowel sounds are normal. She exhibits no distension.  Musculoskeletal: Normal range of motion.  Neurological: She is alert and oriented to person, place, and time.  Skin: Skin is warm and dry.  Psychiatric: She has a normal mood and affect.    Labs reviewed: Labs from 5/16 abstracted today  Imaging and Procedures obtained prior to SNF admission: Mm Diag Breast Tomo Uni Right  08/09/2015  CLINICAL DATA:  Possible asymmetry right breast identified on recent 2D screening mammogram. EXAM: DIGITAL DIAGNOSTIC RIGHT MAMMOGRAM WITH 3D TOMOSYNTHESIS AND CAD COMPARISON:  07/29/2015 and earlier priors ACR Breast Density Category b: There are scattered areas of fibroglandular density. FINDINGS: 3D tomographic images of the left breast were performed in the CC and MLO projection. There is no mass or distortion in the right breast. Specifically, in the region of a questioned possible mass on the screening mammogram of the right breast performed 07/29/2015, normal fibroglandular tissue is imaged, with  no persistent mass. Mammographic images were processed with CAD. IMPRESSION: No evidence of malignancy in the right breast. RECOMMENDATION: Screening mammogram in one year.(Code:SM-B-01Y) I have discussed the findings and recommendations with the patient. Results were also provided in writing at the conclusion of the visit. If applicable, a reminder letter will be sent to the patient regarding the next appointment. BI-RADS CATEGORY  1:  Negative. Electronically Signed   By: Curlene Dolphin M.D.   On: 08/09/2015 12:04    Assessment/Plan 1. Allergic rhinitis, unspecified allergic rhinitis type -chronic, reports this is better with singulair and antronex dietary supplement  2. History of colonic polyps -last cscope in 2014 at [facility] Dr. Loraine Maple (special version due to pt's tortuous colon and adhesions)  3. Intestinal adhesions -s/p appendicitis with peritonitis -no difficulties since her SBO in Dec 2011  -follows healthy diet and drinks plenty of fluids, tries to stay active  4. Hyperlipidemia -in history, but not on meds and last lipid panel looked good in 5/16, will see what it's like in 8/17  5. Essential hypertension, benign -bp here excellent today, reports nerves are much better since living here so continue to monitor  6. Senile osteoporosis -will need recheck of bone density as last in 2015, depending on results, might discuss prolia with her   7. Frequent PVCs -and PACs by history -no recent palpitations and anxiety much improved  -will need EKG at annual to reassess, but sounds regular on exa  Next appt:  Annual exam in 3 mos  Labs/tests ordered:  Cbc, cmp, flp before  Danyela Posas L. Gayle Martinez, D.O. [facility] [address] [place], [place] [address] Cell Phone (Mon-Fri 8am-5pm):  [phone] On Call:  [phone] & follow prompts after 5pm & weekends Office Phone:  [phone] Office Fax:  [phone]

## 2016-01-17 ENCOUNTER — Other Ambulatory Visit: Payer: Self-pay | Admitting: *Deleted

## 2016-01-17 MED ORDER — MONTELUKAST SODIUM 10 MG PO TABS: 10.0000 mg | ORAL_TABLET | Freq: Every day | ORAL | 3 refills | Status: DC

## 2016-01-17 NOTE — Telephone Encounter (Signed)
Patient requested refill to be sent to [facility]

## 2016-02-08 ENCOUNTER — Encounter: Payer: Self-pay | Admitting: Internal Medicine

## 2016-02-08 DIAGNOSIS — E785 Hyperlipidemia, unspecified: Secondary | ICD-10-CM | POA: Diagnosis not present

## 2016-02-08 DIAGNOSIS — I10 Essential (primary) hypertension: Secondary | ICD-10-CM | POA: Diagnosis not present

## 2016-02-08 LAB — BASIC METABOLIC PANEL
BUN: 12 mg/dL (ref 4–21)
Creatinine: 0.7 mg/dL (ref 0.5–1.1)
Glucose: 91 mg/dL
Potassium: 4 mmol/L (ref 3.4–5.3)
Sodium: 141 mmol/L (ref 137–147)

## 2016-02-08 LAB — CBC AND DIFFERENTIAL
HCT: 42 % (ref 36–46)
Hemoglobin: 13.2 g/dL (ref 12.0–16.0)
Platelets: 209 10*3/uL (ref 150–399)
WBC: 4.4 10^3/mL

## 2016-02-08 LAB — LIPID PANEL
Cholesterol: 231 mg/dL — AB (ref 0–200)
HDL: 102 mg/dL — AB (ref 35–70)
LDL Cholesterol: 107 mg/dL
Triglycerides: 109 mg/dL (ref 40–160)

## 2016-02-08 LAB — HEPATIC FUNCTION PANEL
ALT: 15 U/L (ref 7–35)
AST: 20 U/L (ref 13–35)
Alkaline Phosphatase: 65 U/L (ref 25–125)
Bilirubin, Total: 0.6 mg/dL

## 2016-02-10 ENCOUNTER — Encounter: Payer: Self-pay | Admitting: Internal Medicine

## 2016-02-16 ENCOUNTER — Non-Acute Institutional Stay: Payer: Medicare Other | Admitting: Internal Medicine

## 2016-02-16 ENCOUNTER — Encounter: Payer: Self-pay | Admitting: Internal Medicine

## 2016-02-16 VITALS — BP 120/70 | HR 62 | Temp 99.0°F | Ht 63.0 in | Wt 129.0 lb

## 2016-02-16 DIAGNOSIS — J309 Allergic rhinitis, unspecified: Secondary | ICD-10-CM | POA: Diagnosis not present

## 2016-02-16 DIAGNOSIS — E785 Hyperlipidemia, unspecified: Secondary | ICD-10-CM

## 2016-02-16 DIAGNOSIS — I493 Ventricular premature depolarization: Secondary | ICD-10-CM

## 2016-02-16 DIAGNOSIS — Z86010 Personal history of colonic polyps: Secondary | ICD-10-CM

## 2016-02-16 DIAGNOSIS — M810 Age-related osteoporosis without current pathological fracture: Secondary | ICD-10-CM | POA: Diagnosis not present

## 2016-02-16 DIAGNOSIS — I10 Essential (primary) hypertension: Secondary | ICD-10-CM

## 2016-02-16 DIAGNOSIS — Z0000 Encounter for general adult medical examination without abnormal findings: Secondary | ICD-10-CM | POA: Diagnosis not present

## 2016-02-16 DIAGNOSIS — K660 Peritoneal adhesions (postprocedural) (postinfection): Secondary | ICD-10-CM

## 2016-02-16 NOTE — Progress Notes (Signed)
Location:   [place] of Service:  Clinic (12) Provider: Darrol Brandenburg L. Mariea Clonts, D.O., C.M.D.  Patient Care Team: Gayland Curry, DO as PCP - General (Geriatric Medicine) Druscilla Brownie, MD as Consulting Physician (Dermatology) Garlan Fair, MD as Consulting Physician (Gastroenterology) Mosetta Anis, MD as Referring Physician (Allergy) Sueanne Margarita, MD as Consulting Physician (Cardiology)  Extended Emergency Contact Information Primary Emergency Contact: Barich,Doug Address: [address] [place], [place] [address] Johnnette Litter of [place] Phone: [phone] Mobile Phone: [phone] Relation: Spouse  Code Status: DNR Goals of Care: Advanced Directive information Advanced Directives 02/16/2016  Does patient have an advance directive? Yes  Type of Advance Directive Healthcare Power of Attorney  Copy of advanced directive(s) in chart? Yes   Chief Complaint  Patient presents with  . Annual Exam    wellness  . MMSE    30/30 passed clock    HPI: Patient is a 75 y.o. female seen in today for an annual wellness exam and CPE.  Her medical problems were reviewed and she reports no current symptoms from any of them at this time.  Labs reviewed and lipids at goal with excellent HDL.  bp well controlled w/o meds.  On ca with D and additional D for her osteoporosis.  Needs bone density rechecked at her six mo f/u.  Has her next cscope planned at [facility] due to her small colon and prior adhesions from her appendectomy/peritonitis.  Depression screen [facility] 2/9 02/16/2016 11/17/2015  Decreased Interest 0 0  Down, Depressed, Hopeless 0 0  PHQ - 2 Score 0 0    Fall Risk  02/16/2016 11/17/2015  Falls in the past year? No No   MMSE - Mini Mental State Exam 02/16/2016  Orientation to time 5  Orientation to Place 5  Registration 3  Attention/ Calculation 5  Recall 3  Language- name 2 objects 2  Language- repeat 1  Language- follow 3 step command 3    Language- read & follow direction 1  Write a sentence 1  Copy design 1  Total score 30  passed clock  Health Maintenance  Topic Date Due  . INFLUENZA VACCINE  01/18/2016  . MAMMOGRAM  08/17/2016  . COLONOSCOPY  09/23/2017  . TETANUS/TDAP  09/17/2021  . DEXA SCAN  Completed  . ZOSTAVAX  Completed  . PNA vac Low Risk Adult  Completed    Functional Status Survey: Is the patient deaf or have difficulty hearing?: No Does the patient have difficulty seeing, even when wearing glasses/contacts?: No Does the patient have difficulty concentrating, remembering, or making decisions?: No Does the patient have difficulty walking or climbing stairs?: No Does the patient have difficulty dressing or bathing?: No Does the patient have difficulty doing errands alone such as visiting a doctor's office or shopping?: No Current Exercise Habits: Home exercise routine;Structured exercise class, Type of exercise: walking;Other - see comments (chair fit 2 and balance), Time (Minutes): 30, Frequency (Times/Week): 7, Weekly Exercise (Minutes/Week): 210, Intensity: Moderate Exercise limited by: None identified Diet?  Eats healthy balanced diet here, has gained weight since her move, but is exercising consistently Vision Screening Comments: 06/2015 Dr. Franne Forts Hearing: no problem Dentition:  No problem Pain: None  Past Medical History:  Diagnosis Date  . Allergy   . Ankle fracture, left   . Bigeminy   . Cataract   . Colon polyps   . Diverticulosis   . FH: colon cancer   .  Menopausal and perimenopausal disorder   . Multiple allergies   . Osteoporosis 2008   PER DEXA SCAN  . PVC (premature ventricular contraction)     Past Surgical History:  Procedure Laterality Date  . APPENDECTOMY    . CATARACT EXTRACTION W/ INTRAOCULAR LENS  IMPLANT, BILATERAL    . PERFORATED APPENDIX    . SMALL BOWEL OBSTRUCTION      Family History  Problem Relation Age of Onset  . Colon cancer Mother   . CAD Father    . Dementia Father   . Heart disease Father   . Colon cancer Brother     Social History   Social History  . Marital status: Married    Spouse name: N/A  . Number of children: N/A  . Years of education: N/A   Occupational History  . Retired     Tourist information centre manager   Social History Main Topics  . Smoking status: Never Smoker  . Smokeless tobacco: Never Used  . Alcohol use Yes  . Drug use: No  . Sexual activity: Not Asked   Other Topics Concern  . None   Social History Narrative   Tobacco use, amount per day now:0   Past tobacco use, amount per day:0   How many years did you use tobacco:0   Alcohol use (drinks per week):7-14   Diet:FRUITS, VEGETABLES, YOGURT, LEAN MEAT AND FISH, FEW BREADS AND DESSERTS    Do you drink/eat things with caffeine:YES, COFFEE, CHOCOLATE   Marital status: MARRIED                               What year were you married? 1980   Do you live in a house, apartment, assisted living, condo, trailer, etc.? [place]   Is it one or more stories? ONE   How many persons live in your home? 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   YES                               Type and how often? WALK AND STRETCH DAILY   Do you have a living will? YES   Do you have a DNR form?  I THINK SO              If not, do you want to discuss one?   Do you have signed POA/HPOA forms?  YES                      If so, please bring to you appointment       reports that she has never smoked. She has never used smokeless tobacco. She reports that she drinks alcohol. She reports that she does not use drugs.    Medication List       Accurate as of 02/16/16 11:59 PM. Always use your most recent med list.          Calcium Carb-Ergocalciferol 500-200 MG-UNIT Tabs Take 2 tablets by mouth  daily.   montelukast 10 MG tablet Commonly known as:  SINGULAIR Take 1 tablet (10 mg total) by mouth at bedtime.   NON FORMULARY Antronex dietary  supplement 1 per day   Vitamin D3 2000 units capsule Take 2,000 Units by mouth every other day.       Review of Systems:  Review of Systems  Constitutional: Negative for chills, fever, malaise/fatigue and weight loss.  HENT: Negative for congestion and hearing loss.   Eyes: Negative for blurred vision.       Glasses  Respiratory: Negative for cough and shortness of breath.   Cardiovascular: Negative for chest pain, palpitations and leg swelling.  Gastrointestinal: Negative for abdominal pain, blood in stool, constipation, diarrhea, heartburn and melena.  Genitourinary: Negative for dysuria.  Musculoskeletal: Negative for back pain, falls and myalgias.  Skin: Negative for itching and rash.  Neurological: Negative for dizziness, loss of consciousness, weakness and headaches.  Endo/Heme/Allergies: Does not bruise/bleed easily.  Psychiatric/Behavioral: Negative for depression and memory loss. The patient is not nervous/anxious and does not have insomnia.     Physical Exam: Vitals:   02/16/16 1008  BP: 120/70  Pulse: 62  Temp: 99 F (37.2 C)  TempSrc: Oral  SpO2: 97%  Weight: 129 lb (58.5 kg)  Height: 5\' 3"  (1.6 m)   Body mass index is 22.85 kg/m. Physical Exam  Constitutional: She is oriented to person, place, and time. She appears well-developed and well-nourished. No distress.  HENT:  Head: Normocephalic and atraumatic.  Right Ear: External ear normal.  Left Ear: External ear normal.  Nose: Nose normal.  Mouth/Throat: Oropharynx is clear and moist. No oropharyngeal exudate.  Bilateral TMs pink, no significant cerumen present in canals  Eyes: Conjunctivae and EOM are normal. Pupils are equal, round, and reactive to light.  glasses  Neck: Normal range of motion. Neck supple. No thyromegaly present.  Cardiovascular:  Normal rate, regular rhythm and intact distal pulses.   No murmur heard. Occasional skipped beat  Pulmonary/Chest: Effort normal and breath sounds normal. No respiratory distress. She has no wheezes. She has no rales.  Abdominal: Soft. Bowel sounds are normal. She exhibits no distension. There is no tenderness.  Midline abdominal scar from appe/ex lap  Musculoskeletal: Normal range of motion.  Neurological: She is alert and oriented to person, place, and time. She displays abnormal reflex. No cranial nerve deficit. She exhibits normal muscle tone. Coordination normal.  DTRs 0/5  Skin: Skin is warm and dry. Capillary refill takes less than 2 seconds.  Psychiatric: She has a normal mood and affect. Her behavior is normal. Judgment and thought content normal.    Labs reviewed: Basic Metabolic Panel:  Recent Labs  02/08/16 0829  NA 141  K 4.0  BUN 12  CREATININE 0.7   Liver Function Tests:  Recent Labs  02/08/16 0829  AST 20  ALT 15  ALKPHOS 65   No results for input(s): LIPASE, AMYLASE in the last 8760 hours. No results for input(s): AMMONIA in the last 8760 hours. CBC:  Recent Labs  02/08/16 0829  WBC 4.4  HGB 13.2  HCT 42  PLT 209   Lipid Panel:  Recent Labs  02/08/16 0829  CHOL 231*  HDL 102*  LDLCALC 107  TRIG 109   Lab Results  Component Value Date   HGBA1C 5.4 10/29/2014    Procedures: EKG:  Sinus bradycardia at 59bpm with PAC present, no signs of acute ischemia or infarct  Assessment/Plan 1. Medicare annual wellness visit, subsequent -see hpi, she is up to date on all but bone density--will order this in 6 mos at her f/u visit -has had all appropriate vaccines and other  preventive care for her age -has plans for upcoming cscope due to prior polyps and fh/o colon ca  2. Essential hypertension, benign - bp well controlled w/o meds at this time--is regularly exercising and eating a healthy balanced diet - EKG 12-Lead above unremarkable  3.  Frequent PVCs -one PAC noted on current EKG, she notes she has not been having palpitations anymore since living here--says things are just generally much easier and so many things are taking care of for her  4. History of colonic polyps -has upcoming cscope in [facility] scheduled for f/u  5. Hyperlipidemia -lipids great considering her HDL over 100 and LDL about the same -cont regular exercise -was advised that her weight is good as is and she should try not to gain more  6. Intestinal adhesions -s/p her appe and peritonitis, will monitor carefully for any signs of obstruction over time, but she's done well now for several years  7. Allergic rhinitis, unspecified allergic rhinitis type -did not complain of active concerns with this at present, cont singulair  8. Senile osteoporosis -cont ca with D and additional D, weightbearing exercise, and healthy diet -will plan to f/u bone density at next appt  Labs/tests ordered:   Orders Placed This Encounter  Procedures  . EKG 12-Lead    Next appt:  6 mos for med mgt (needs bone density ordered then)  Alicen Donalson L. Zylpha Poynor, D.O. [facility] [address], [place] [address] Cell Phone (Mon-Fri 8am-5pm):  [phone] On Call:  [phone] & follow prompts after 5pm & weekends Office Phone:  [phone] Office Fax:  [phone]

## 2016-04-07 ENCOUNTER — Telehealth: Payer: Self-pay | Admitting: Internal Medicine

## 2016-04-11 NOTE — Telephone Encounter (Signed)
error 

## 2016-04-24 ENCOUNTER — Ambulatory Visit (INDEPENDENT_AMBULATORY_CARE_PROVIDER_SITE_OTHER): Payer: Medicare Other

## 2016-04-24 DIAGNOSIS — Z23 Encounter for immunization: Secondary | ICD-10-CM

## 2016-05-22 ENCOUNTER — Other Ambulatory Visit: Payer: Self-pay | Admitting: Internal Medicine

## 2016-07-20 ENCOUNTER — Other Ambulatory Visit: Payer: Self-pay | Admitting: Internal Medicine

## 2016-07-20 DIAGNOSIS — Z1231 Encounter for screening mammogram for malignant neoplasm of breast: Secondary | ICD-10-CM

## 2016-08-03 ENCOUNTER — Other Ambulatory Visit: Payer: Self-pay | Admitting: *Deleted

## 2016-08-03 MED ORDER — OSELTAMIVIR PHOSPHATE 75 MG PO CAPS: 75.0000 mg | ORAL_CAPSULE | Freq: Every day | ORAL | 0 refills | Status: AC

## 2016-08-03 MED ORDER — OSELTAMIVIR PHOSPHATE 75 MG PO CAPS: 75.0000 mg | ORAL_CAPSULE | Freq: Two times a day (BID) | ORAL | 0 refills | Status: DC

## 2016-08-03 NOTE — Addendum Note (Signed)
Addended by: Despina Hidden on: 08/03/2016 02:10 PM   Modules accepted: Orders

## 2016-08-03 NOTE — Telephone Encounter (Signed)
Pharmacy called back and stated it should be once daily for 10 days, medication corrected and resent back to pharmacy.

## 2016-08-03 NOTE — Telephone Encounter (Signed)
Gayland Curry, DO  to Me     08/03/16 1:50 PM  Note    I also recommend his wife take tamiflu prophylaxis which is the 75mg  po daily for 14 days--please also send it in for her.  I also told Maudie Mercury that at [facility] and she was going to notify [place].

## 2016-08-16 ENCOUNTER — Encounter: Payer: Self-pay | Admitting: Internal Medicine

## 2016-08-16 ENCOUNTER — Non-Acute Institutional Stay: Payer: Medicare Other | Admitting: Internal Medicine

## 2016-08-16 VITALS — BP 120/80 | HR 68 | Temp 97.7°F | Wt 132.0 lb

## 2016-08-16 DIAGNOSIS — H52203 Unspecified astigmatism, bilateral: Secondary | ICD-10-CM | POA: Diagnosis not present

## 2016-08-16 DIAGNOSIS — I10 Essential (primary) hypertension: Secondary | ICD-10-CM | POA: Diagnosis not present

## 2016-08-16 DIAGNOSIS — H353131 Nonexudative age-related macular degeneration, bilateral, early dry stage: Secondary | ICD-10-CM | POA: Diagnosis not present

## 2016-08-16 DIAGNOSIS — M810 Age-related osteoporosis without current pathological fracture: Secondary | ICD-10-CM

## 2016-08-16 DIAGNOSIS — Z961 Presence of intraocular lens: Secondary | ICD-10-CM | POA: Diagnosis not present

## 2016-08-16 DIAGNOSIS — J301 Allergic rhinitis due to pollen: Secondary | ICD-10-CM

## 2016-08-16 NOTE — Progress Notes (Signed)
Location:  [facility] clinic Provider:  Vern Prestia L. Mariea Clonts, D.O., C.M.D.  Code Status: DNR Goals of Care:  Advanced Directives 08/16/2016  Does Patient Have a Medical Advance Directive? Yes  Type of Advance Directive [place] in Chart? Yes   Chief Complaint  Patient presents with  . Medical Management of Chronic Issues    57mth follow-up    HPI: Patient is a 76 y.o. female seen today for medical management of chronic diseases.  No new things.    Is doing a pretty good job of walking and going to exercise class.   Osteoporosis:  Last one 10/02/13 T -2.9 in back and -2.5 in hip.  Doing her walking--says could do better and taking her vitamin D and calcium with D.  Did have an ankle fx when she ran over lumber on a segway in [place].  They were doing a segway tour.    Allergies have been so much better.  She quit the singulair.  Doing fine w/o it.  Has a filter on the air ($500 investment once--[employer] heating/air conditioning).   Past Medical History:  Diagnosis Date  . Allergy   . Ankle fracture, left   . Bigeminy   . Cataract   . Colon polyps   . Diverticulosis   . FH: colon cancer   . Menopausal and perimenopausal disorder   . Multiple allergies   . Osteoporosis 2008   PER DEXA SCAN  . PVC (premature ventricular contraction)     Past Surgical History:  Procedure Laterality Date  . APPENDECTOMY    . CATARACT EXTRACTION W/ INTRAOCULAR LENS  IMPLANT, BILATERAL    . PERFORATED APPENDIX    . SMALL BOWEL OBSTRUCTION      Allergies  Allergen Reactions  . Pollen Extract     Allergies as of 08/16/2016      Reactions   Pollen Extract       Medication List       Accurate as of 08/16/16  1:37 PM. Always use your most recent med list.          Calcium Carb-Ergocalciferol 500-200 MG-UNIT Tabs Take 2 tablets by mouth daily.   NON FORMULARY Antronex dietary supplement 1 per day   Vitamin D3 2000 units capsule Take  2,000 Units by mouth every other day.       Review of Systems:  Review of Systems  Constitutional: Negative for chills and fever.  HENT: Negative for congestion, hearing loss and sore throat.   Eyes: Negative for blurred vision.       Glasses  Respiratory: Negative for cough, shortness of breath and wheezing.   Cardiovascular: Negative for chest pain, palpitations and leg swelling.  Gastrointestinal: Negative for abdominal pain, blood in stool, constipation and melena.  Genitourinary: Negative for dysuria, frequency and urgency.  Musculoskeletal: Negative for falls and joint pain.  Skin: Negative for itching and rash.  Neurological: Negative for dizziness and loss of consciousness.  Endo/Heme/Allergies: Does not bruise/bleed easily.  Psychiatric/Behavioral: Negative for depression and memory loss. The patient is not nervous/anxious and does not have insomnia.     Health Maintenance  Topic Date Due  . MAMMOGRAM  08/17/2016  . COLONOSCOPY  09/23/2017  . TETANUS/TDAP  09/17/2021  . INFLUENZA VACCINE  Completed  . DEXA SCAN  Completed  . PNA vac Low Risk Adult  Completed    Physical Exam: Vitals:   08/16/16 1333  BP: 120/80  Pulse:  68  Temp: 97.7 F (36.5 C)  TempSrc: Oral  SpO2: 97%  Weight: 132 lb (59.9 kg)   Body mass index is 23.38 kg/m. Physical Exam  Constitutional: She is oriented to person, place, and time. She appears well-developed and well-nourished. No distress.  Cardiovascular: Normal rate, regular rhythm, normal heart sounds and intact distal pulses.   Pulmonary/Chest: Effort normal and breath sounds normal. No respiratory distress.  Abdominal: Bowel sounds are normal.  Musculoskeletal: Normal range of motion.  Neurological: She is alert and oriented to person, place, and time.  Skin: Skin is warm and dry.  Psychiatric: She has a normal mood and affect. Her behavior is normal. Judgment and thought content normal.   Labs reviewed: Basic Metabolic  Panel:  Recent Labs  02/08/16 0829  NA 141  K 4.0  BUN 12  CREATININE 0.7   Liver Function Tests:  Recent Labs  02/08/16 0829  AST 20  ALT 15  ALKPHOS 65   No results for input(s): LIPASE, AMYLASE in the last 8760 hours. No results for input(s): AMMONIA in the last 8760 hours. CBC:  Recent Labs  02/08/16 0829  WBC 4.4  HGB 13.2  HCT 42  PLT 209   Lipid Panel:  Recent Labs  02/08/16 0829  CHOL 231*  HDL 102*  LDLCALC 107  TRIG 109   Lab Results  Component Value Date   HGBA1C 5.4 10/29/2014    Assessment/Plan 1. Senile osteoporosis - cont ca with D, additional D and increase weightbearing exercise - DG Bone Density; Future -see hpi for more details  2. Essential hypertension, benign -bp at goal without meds at this time, cont walking and healthy low sodium diet  3. Chronic allergic rhinitis due to pollen, unspecified seasonality -now has a special filter on her home heating and air system so no longer needs her singulair  Labs/tests ordered:   Orders Placed This Encounter  Procedures  . DG Bone Density    Standing Status:   Future    Standing Expiration Date:   10/16/2017    Order Specific Question:   Reason for Exam (SYMPTOM  OR DIAGNOSIS REQUIRED)    Answer:   senile osteoporosis, last 2015, on ca with D, additional D and weightbearing exercise    Order Specific Question:   Preferred imaging location?    Answer:   [facility]    Next appt:  6 mos for annual wellness, labs before  [place] Aakash Hollomon, D.O. [facility] [address], [place] [address] Cell Phone (Mon-Fri 8am-5pm):  [phone] On Call:  [phone] & follow prompts after 5pm & weekends Office Phone:  [phone] Office Fax:  [phone]

## 2016-08-22 ENCOUNTER — Ambulatory Visit
Admission: RE | Admit: 2016-08-22 | Discharge: 2016-08-22 | Disposition: A | Discharge status: Home / Self Care | Payer: Medicare Other | Source: Ambulatory Visit | Attending: Internal Medicine | Admitting: Internal Medicine

## 2016-08-22 DIAGNOSIS — Z1231 Encounter for screening mammogram for malignant neoplasm of breast: Secondary | ICD-10-CM

## 2016-08-24 ENCOUNTER — Encounter: Payer: Self-pay | Admitting: *Deleted

## 2016-08-30 ENCOUNTER — Ambulatory Visit
Admission: RE | Admit: 2016-08-30 | Discharge: 2016-08-30 | Disposition: A | Discharge status: Home / Self Care | Payer: Medicare Other | Source: Ambulatory Visit | Attending: Internal Medicine | Admitting: Internal Medicine

## 2016-08-30 DIAGNOSIS — Z780 Asymptomatic menopausal state: Secondary | ICD-10-CM | POA: Diagnosis not present

## 2016-08-30 DIAGNOSIS — M810 Age-related osteoporosis without current pathological fracture: Secondary | ICD-10-CM

## 2016-09-20 ENCOUNTER — Encounter: Payer: Self-pay | Admitting: Internal Medicine

## 2016-09-20 ENCOUNTER — Non-Acute Institutional Stay: Payer: Medicare Other | Admitting: Internal Medicine

## 2016-09-20 VITALS — BP 110/68 | HR 70 | Temp 98.9°F | Wt 133.0 lb

## 2016-09-20 DIAGNOSIS — M810 Age-related osteoporosis without current pathological fracture: Secondary | ICD-10-CM | POA: Diagnosis not present

## 2016-09-20 NOTE — Progress Notes (Signed)
Location:  Occupational psychologist of Service:  Clinic (12)  Provider: Larnie Heart L. Mariea Clonts, D.O., C.M.D.  Code Status: DNR Goals of Care:  Advanced Directives 09/20/2016  Does Patient Have a Medical Advance Directive? Yes  Type of Advance Directive [place] in Chart? Yes   Chief Complaint  Patient presents with  . Follow-up    discuss bone density and medications    HPI: Patient is a 76 y.o. female seen today for medical management of chronic diseases.    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  She had not previously had any GI upset.  She then took [place] monthly for several years.  She didn't like it.  She didn't feel like it was good for her.  Admits to not being real faithful with her walking and exercise.  She has been good about taking her vitamin D and calcium supplements.    Past Medical History:  Diagnosis Date  . Allergy   . Ankle fracture, left   . Bigeminy   . Cataract   . Colon polyps   . Diverticulosis   . FH: colon cancer   . Menopausal and perimenopausal disorder   . Multiple allergies   . Osteoporosis 2008   PER DEXA SCAN  . PVC (premature ventricular contraction)     Past Surgical History:  Procedure Laterality Date  . APPENDECTOMY    . CATARACT EXTRACTION W/ INTRAOCULAR LENS  IMPLANT, BILATERAL    . PERFORATED APPENDIX    . SMALL BOWEL OBSTRUCTION      Allergies  Allergen Reactions  . Pollen Extract     Allergies as of 09/20/2016      Reactions   Pollen Extract       Medication List       Accurate as of 09/20/16  8:32 AM. Always use your most recent med list.          Calcium Carb-Ergocalciferol 500-200 MG-UNIT Tabs Take 2 tablets by mouth daily.   NON FORMULARY Antronex dietary supplement 1 per day   Vitamin D3 2000 units capsule Take 2,000 Units by mouth every other day.       Review of Systems:  Review of  Systems  Constitutional: Negative for chills, fever and malaise/fatigue.  Eyes: Negative for blurred vision.       Glasses  Respiratory: Negative for shortness of breath.   Cardiovascular: Negative for chest pain and palpitations.  Gastrointestinal: Negative for abdominal pain.  Genitourinary: Negative for dysuria.  Musculoskeletal: Negative for falls.       Prior ankle fx  Neurological: Negative for dizziness, loss of consciousness and weakness.  Endo/Heme/Allergies:       Osteoporosis  Psychiatric/Behavioral: Negative for memory loss.    Health Maintenance  Topic Date Due  . INFLUENZA VACCINE  01/17/2017  . MAMMOGRAM  08/22/2017  . COLONOSCOPY  09/23/2017  . TETANUS/TDAP  09/17/2021  . DEXA SCAN  Completed  . PNA vac Low Risk Adult  Completed    Physical Exam: Vitals:   09/20/16 0824  BP: 110/68  Pulse: 70  Temp: 98.9 F (37.2 C)  TempSrc: Oral  SpO2: 96%  Weight: 133 lb (60.3 kg)   Body mass index is 23.56 kg/m. Physical Exam  Constitutional: She is oriented to person, place, and time. She appears well-developed and well-nourished. No distress.  Cardiovascular: Normal rate, regular rhythm, normal heart sounds  and intact distal pulses.   Pulmonary/Chest: Effort normal and breath sounds normal. No respiratory distress.  Musculoskeletal: Normal range of motion.  Neurological: She is alert and oriented to person, place, and time.  Skin: Skin is warm and dry.  Psychiatric: She has a normal mood and affect.    Labs reviewed: Basic Metabolic Panel:  Recent Labs  02/08/16 0829  NA 141  K 4.0  BUN 12  CREATININE 0.7   Liver Function Tests:  Recent Labs  02/08/16 0829  AST 20  ALT 15  ALKPHOS 65   No results for input(s): LIPASE, AMYLASE in the last 8760 hours. No results for input(s): AMMONIA in the last 8760 hours. CBC:  Recent Labs  02/08/16 0829  WBC 4.4  HGB 13.2  HCT 42  PLT 209   Lipid Panel:  Recent Labs  02/08/16 0829  CHOL 231*    HDL 102*  LDLCALC 107  TRIG 109   Lab Results  Component Value Date   HGBA1C 5.4 10/29/2014    Procedures since last visit: Dg Bone Density  Result Date: 08/30/2016 EXAM: DUAL X-RAY ABSORPTIOMETRY (DXA) FOR BONE MINERAL DENSITY IMPRESSION: Referring Physician:  Hollace Kinnier PATIENT: Name: ADIN, LARICCIA Patient ID: [id number] Birth Date: 16-Feb-1941 Height: 61.2 in. Sex: Female Measured: 08/30/2016 Weight: 131.0 lbs. Indications: Advanced Age, Caucasian, Estrogen Deficient, Height Loss (781.91), History of Fracture (Adult) (V15.51), History of Osteoporosis, Postmenopausal Fractures: Ankle Treatments: Calcium (E943.0), Vitamin D (E933.5) ASSESSMENT: The BMD measured at Femur Total Left is 0.628 g/cm2 with a T-score of -3.0. Patient is considered OSTEOPOROTIC according to [facility]) criteria. Lumbar spine was not utilized due to advanced degenerative changes. Patient is not eligible for FRAX due to patient being osteoprotic. There has been a statistically significant decrease in BMD of the left hip since prior exam dated 10/02/2013. Site Region Measured Date Measured Age YA BMD Significant CHANGE T-score DualFemur Total Left 08/30/2016 76.9 -3.0 0.628 g/cm2 * Left Forearm Radius 33% 08/30/2016 76.9 -2.5 0.661 g/cm2 [employer] [facility]) criteria for post-menopausal, Caucasian Women: Normal       T-score at or above -1 SD Osteopenia   T-score between -1 and -2.5 SD Osteoporosis T-score at or below -2.5 SD RECOMMENDATION: [place] recommends that FDA-approved medical therapies be considered in postmenopausal women and men age 69 or older with a: 1. Hip or vertebral (clinical or morphometric) fracture. 2. T-score of <-2.5 at the spine or hip. 3. Ten-year fracture probability by FRAX of 3% or greater for hip fracture or 20% or greater for major osteoporotic fracture. All treatment decisions require clinical judgment and consideration of individual  patient factors, including patient preferences, co-morbidities, previous drug use, risk factors not captured in the FRAX model (e.g. falls, vitamin D deficiency, increased bone turnover, interval significant decline in bone density) and possible under - or over-estimation of fracture risk by FRAX. All patients should ensure an adequate intake of dietary calcium (1200 mg/d) and vitamin D (800 IU daily) unless contraindicated. FOLLOW-UP: People with diagnosed cases of osteoporosis or at high risk for fracture should have regular bone mineral density tests. For patients eligible for Medicare, routine testing is allowed once every 2 years. The testing frequency can be increased to one year for patients who have rapidly progressing disease, those who are receiving or discontinuing medical therapy to restore bone mass, or have additional risk factors. I have reviewed this report, and agree with the above findings. Mark A. Thornton Papas, M.D. [facility] Radiology  Electronically Signed   By: Lavonia Dana M.D.   On: 08/30/2016 15:16   Mm Screening Breast Tomo Bilateral  Result Date: 08/22/2016 CLINICAL DATA:  Screening. EXAM: 2D DIGITAL SCREENING BILATERAL MAMMOGRAM WITH CAD AND ADJUNCT TOMO COMPARISON:  Previous exam(s). ACR Breast Density Category c: The breast tissue is heterogeneously dense, which may obscure small masses. FINDINGS: There are no findings suspicious for malignancy. Images were processed with CAD. IMPRESSION: No mammographic evidence of malignancy. A result letter of this screening mammogram will be mailed directly to the patient. RECOMMENDATION: Screening mammogram in one year. (Code:[id number]-B-01Y) BI-RADS CATEGORY  1: Negative. Electronically Signed   By: Pamelia Hoit M.D.   On: 08/22/2016 13:53    Assessment/Plan 1. Senile osteoporosis -agrees to try taking prolia depending on cost -will complete form for authorization of prolia -cont ca with D and additional D  Labs/tests ordered:  No orders of the defined  types were placed in this encounter.  Next appt:  02/14/2017  Baylor Teegarden L. Asser Lucena, D.O. [facility] [address], [place] [address] Cell Phone (Mon-Fri 8am-5pm):  [phone] On Call:  [phone] & follow prompts after 5pm & weekends Office Phone:  [phone] Office Fax:  [phone]

## 2016-10-02 ENCOUNTER — Telehealth: Payer: Self-pay | Admitting: *Deleted

## 2016-10-02 NOTE — Telephone Encounter (Signed)
Patient called and stated that she forgot to talk with you about her Colonoscopy that is due this month. Stated that she needs a referral to [facility] to have done. Please Advise.

## 2016-10-02 NOTE — Telephone Encounter (Signed)
Spoke with patient and she understand she was a year too early, she will call next year.

## 2016-10-02 NOTE — Telephone Encounter (Signed)
Recommendation:- Patient has a contact number available for emergencies.    The signs and symptoms of potential delayed complications    were discussed with the patient. Return to normal    activities tomorrow. Written discharge instructions were    provided to the patient.   - Resume previous diet.   - Continue present medications.   - Repeat colonoscopy in 3 years for surveillance unless    the cecal lesion that was not removed is neoplastic; if it    is neoplastic, will need colonoscopy for resection   According to the note pt last colonoscopy was 2016, shouldn't need one until 2019?

## 2016-10-02 NOTE — Telephone Encounter (Signed)
I would have thought the next cscope would be 2019 also.  What did she say when you called her back?

## 2016-10-24 ENCOUNTER — Ambulatory Visit: Payer: Self-pay

## 2016-10-25 ENCOUNTER — Ambulatory Visit (INDEPENDENT_AMBULATORY_CARE_PROVIDER_SITE_OTHER): Payer: Medicare Other | Admitting: Internal Medicine

## 2016-10-25 DIAGNOSIS — M810 Age-related osteoporosis without current pathological fracture: Secondary | ICD-10-CM | POA: Diagnosis not present

## 2016-10-25 MED ORDER — DENOSUMAB 60 MG/ML ~~LOC~~ SOLN
60.0000 mg | Freq: Once | SUBCUTANEOUS | Status: AC
  Administered 2016-10-25: 60 mg via SUBCUTANEOUS

## 2016-11-29 ENCOUNTER — Other Ambulatory Visit: Payer: Self-pay | Admitting: Internal Medicine

## 2016-11-29 MED ORDER — ZOSTER VAC RECOMB ADJUVANTED 50 MCG/0.5ML IM SUSR: 0.5000 mL | Freq: Once | INTRAMUSCULAR | 1 refills | Status: AC

## 2016-11-29 NOTE — Progress Notes (Signed)
Pt requests shingrix vaccine to be on same schedule with her husband so sent to [facility] for her.

## 2017-01-17 ENCOUNTER — Encounter: Payer: Self-pay | Admitting: Internal Medicine

## 2017-01-17 ENCOUNTER — Non-Acute Institutional Stay: Payer: Medicare Other

## 2017-01-17 VITALS — BP 130/78 | HR 77 | Temp 98.5°F | Ht 63.0 in | Wt 132.0 lb

## 2017-01-17 DIAGNOSIS — Z0000 Encounter for general adult medical examination without abnormal findings: Secondary | ICD-10-CM | POA: Diagnosis not present

## 2017-01-17 NOTE — Patient Instructions (Addendum)
[name] , Thank you for taking time to come for your Medicare Wellness Visit. I appreciate your ongoing commitment to your health goals. Please review the following plan we discussed and let me know if I can assist you in the future.   Screening recommendations/referrals: Colonoscopy due 09/23/2017 Mammogram excluded, pt over age 77 Bone Density up to date Recommended yearly ophthalmology/optometry visit for glaucoma screening and checkup Recommended yearly dental visit for hygiene and checkup  Vaccinations: Influenza vaccine due 2018 fall season Pneumococcal vaccine up to date Tdap vaccine up to date. Due 09/17/2021 Shingles vaccine up to date. You can fill your prescription when [facility] has the vaccine [facility] in [facility]  Advanced directives: In Chart  Conditions/risks identified: None  Next appointment: 02/14/2017 10am Dr. Mariea Clonts   Preventive Care 39 Years and Older, Female Preventive care refers to lifestyle choices and visits with your health care provider that can promote health and wellness. What does preventive care include?  A yearly physical exam. This is also called an annual well check.  Dental exams once or twice a year.  Routine eye exams. Ask your health care provider how often you should have your eyes checked.  Personal lifestyle choices, including:  Daily care of your teeth and gums.  Regular physical activity.  Eating a healthy diet.  Avoiding tobacco and drug use.  Limiting alcohol use.  Practicing safe sex.  Taking low-dose aspirin every day.  Taking vitamin and mineral supplements as recommended by your health care provider. What happens during an annual well check? The services and screenings done by your health care provider during your annual well check will depend on your age, overall health, lifestyle risk factors, and family history of disease. Counseling  Your health care provider may ask you questions about your:  Alcohol use.  Tobacco  use.  Drug use.  Emotional well-being.  Home and relationship well-being.  Sexual activity.  Eating habits.  History of falls.  Memory and ability to understand (cognition).  Work and work Statistician.  Reproductive health. Screening  You may have the following tests or measurements:  Height, weight, and BMI.  Blood pressure.  Lipid and cholesterol levels. These may be checked every 5 years, or more frequently if you are over 66 years old.  Skin check.  Lung cancer screening. You may have this screening every year starting at age 51 if you have a 30-pack-year history of smoking and currently smoke or have quit within the past 15 years.  Fecal occult blood test (FOBT) of the stool. You may have this test every year starting at age 68.  Flexible sigmoidoscopy or colonoscopy. You may have a sigmoidoscopy every 5 years or a colonoscopy every 10 years starting at age 29.  Hepatitis C blood test.  Hepatitis B blood test.  Sexually transmitted disease (STD) testing.  Diabetes screening. This is done by checking your blood sugar (glucose) after you have not eaten for a while (fasting). You may have this done every 1-3 years.  Bone density scan. This is done to screen for osteoporosis. You may have this done starting at age 74.  Mammogram. This may be done every 1-2 years. Talk to your health care provider about how often you should have regular mammograms. Talk with your health care provider about your test results, treatment options, and if necessary, the need for more tests. Vaccines  Your health care provider may recommend certain vaccines, such as:  Influenza vaccine. This is recommended every year.  Tetanus,  diphtheria, and acellular pertussis (Tdap, Td) vaccine. You may need a Td booster every 10 years.  Zoster vaccine. You may need this after age 18.  Pneumococcal 13-valent conjugate (PCV13) vaccine. One dose is recommended after age 56.  Pneumococcal  polysaccharide (PPSV23) vaccine. One dose is recommended after age 36. Talk to your health care provider about which screenings and vaccines you need and how often you need them. This information is not intended to replace advice given to you by your health care provider. Make sure you discuss any questions you have with your health care provider. Document Released: 07/02/2015 Document Revised: 02/23/2016 Document Reviewed: 04/06/2015 Elsevier Interactive Patient Education  2017 [place] Prevention in the Home Falls can cause injuries. They can happen to people of all ages. There are many things you can do to make your home safe and to help prevent falls. What can I do on the outside of my home?  Regularly fix the edges of walkways and driveways and fix any cracks.  Remove anything that might make you trip as you walk through a door, such as a raised step or threshold.  Trim any bushes or trees on the path to your home.  Use bright outdoor lighting.  Clear any walking paths of anything that might make someone trip, such as rocks or tools.  Regularly check to see if handrails are loose or broken. Make sure that both sides of any steps have handrails.  Any raised decks and porches should have guardrails on the edges.  Have any leaves, snow, or ice cleared regularly.  Use sand or salt on walking paths during winter.  Clean up any spills in your garage right away. This includes oil or grease spills. What can I do in the bathroom?  Use night lights.  Install grab bars by the toilet and in the tub and shower. Do not use towel bars as grab bars.  Use non-skid mats or decals in the tub or shower.  If you need to sit down in the shower, use a plastic, non-slip stool.  Keep the floor dry. Clean up any water that spills on the floor as soon as it happens.  Remove soap buildup in the tub or shower regularly.  Attach bath mats securely with double-sided non-slip rug  tape.  Do not have throw rugs and other things on the floor that can make you trip. What can I do in the bedroom?  Use night lights.  Make sure that you have a light by your bed that is easy to reach.  Do not use any sheets or blankets that are too big for your bed. They should not hang down onto the floor.  Have a firm chair that has side arms. You can use this for support while you get dressed.  Do not have throw rugs and other things on the floor that can make you trip. What can I do in the kitchen?  Clean up any spills right away.  Avoid walking on wet floors.  Keep items that you use a lot in easy-to-reach places.  If you need to reach something above you, use a strong step stool that has a grab bar.  Keep electrical cords out of the way.  Do not use floor polish or wax that makes floors slippery. If you must use wax, use non-skid floor wax.  Do not have throw rugs and other things on the floor that can make you trip. What can I do with  my stairs?  Do not leave any items on the stairs.  Make sure that there are handrails on both sides of the stairs and use them. Fix handrails that are broken or loose. Make sure that handrails are as long as the stairways.  Check any carpeting to make sure that it is firmly attached to the stairs. Fix any carpet that is loose or worn.  Avoid having throw rugs at the top or bottom of the stairs. If you do have throw rugs, attach them to the floor with carpet tape.  Make sure that you have a light switch at the top of the stairs and the bottom of the stairs. If you do not have them, ask someone to add them for you. What else can I do to help prevent falls?  Wear shoes that:  Do not have high heels.  Have rubber bottoms.  Are comfortable and fit you well.  Are closed at the toe. Do not wear sandals.  If you use a stepladder:  Make sure that it is fully opened. Do not climb a closed stepladder.  Make sure that both sides of the  stepladder are locked into place.  Ask someone to hold it for you, if possible.  Clearly mark and make sure that you can see:  Any grab bars or handrails.  First and last steps.  Where the edge of each step is.  Use tools that help you move around (mobility aids) if they are needed. These include:  Canes.  Walkers.  Scooters.  Crutches.  Turn on the lights when you go into a dark area. Replace any light bulbs as soon as they burn out.  Set up your furniture so you have a clear path. Avoid moving your furniture around.  If any of your floors are uneven, fix them.  If there are any pets around you, be aware of where they are.  Review your medicines with your doctor. Some medicines can make you feel dizzy. This can increase your chance of falling. Ask your doctor what other things that you can do to help prevent falls. This information is not intended to replace advice given to you by your health care provider. Make sure you discuss any questions you have with your health care provider. Document Released: 04/01/2009 Document Revised: 11/11/2015 Document Reviewed: 07/10/2014 Elsevier Interactive Patient Education  2017 [employer].

## 2017-01-17 NOTE — Progress Notes (Signed)
Subjective:   Traci Woodard is a 76 y.o. female who presents for Medicare Annual (Subsequent) preventive examination at [facility] patient  Last AWV-02/16/16    Objective:     Vitals: BP 130/78 (BP Location: Right Arm, Patient Position: Sitting)   Pulse 77   Temp 98.5 F (36.9 C) (Oral)   Ht 5\' 3"  (1.6 m)   Wt 132 lb (59.9 kg)   SpO2 96%   BMI 23.38 kg/m   Body mass index is 23.38 kg/m.   Tobacco History  Smoking Status  . Never Smoker  Smokeless Tobacco  . Never Used     Counseling given: Not Answered   Past Medical History:  Diagnosis Date  . Allergy   . Ankle fracture, left   . Bigeminy   . Cataract   . Colon polyps   . Diverticulosis   . FH: colon cancer   . Menopausal and perimenopausal disorder   . Multiple allergies   . Osteoporosis 2008   PER DEXA SCAN  . PVC (premature ventricular contraction)    Past Surgical History:  Procedure Laterality Date  . APPENDECTOMY    . CATARACT EXTRACTION W/ INTRAOCULAR LENS  IMPLANT, BILATERAL    . PERFORATED APPENDIX    . SMALL BOWEL OBSTRUCTION     Family History  Problem Relation Age of Onset  . Colon cancer Mother   . CAD Father   . Dementia Father   . Heart disease Father   . Colon cancer Brother   . Breast cancer Paternal Aunt    History  Sexual Activity  . Sexual activity: Not on file    Outpatient Encounter Prescriptions as of 01/17/2017  Medication Sig  . Calcium Carb-Ergocalciferol 500-200 MG-UNIT TABS Take 2 tablets by mouth daily.   . Cholecalciferol (VITAMIN D3) 2000 units capsule Take 2,000 Units by mouth every other day.  . denosumab (PROLIA) 60 MG/ML SOLN injection Inject 60 mg into the skin every 6 (six) months. Administer in upper arm, thigh, or abdomen  . NON FORMULARY Antronex dietary supplement 1 per day   No facility-administered encounter medications on file as of 01/17/2017.     Activities of Daily Living In your present state of health, do you have  any difficulty performing the following activities: 01/17/2017 02/16/2016  Hearing? N N  Vision? N N  Difficulty concentrating or making decisions? N N  Walking or climbing stairs? N N  Dressing or bathing? N N  Doing errands, shopping? N N  Preparing Food and eating ? N N  Using the Toilet? N N  In the past six months, have you accidently leaked urine? N Y  Comment - if she has just gotten home, slightest bit  Do you have problems with loss of bowel control? N N  Managing your Medications? N N  Managing your Finances? N N  Comment - her husband primarily does it  [place], broadcasting/film/video? N N  Some recent data might be hidden    Patient Care Team: Gayland Curry, DO as PCP - General (Geriatric Medicine) Druscilla Brownie, MD as Consulting Physician (Dermatology) Garlan Fair, MD as Consulting Physician (Gastroenterology) Mosetta Anis, MD as Referring Physician (Allergy) Sueanne Margarita, MD as Consulting Physician (Cardiology)    Assessment:     Exercise Activities and Dietary recommendations Current Exercise Habits: Home exercise routine, Type of exercise: walking, Time (Minutes): 30, Frequency (Times/Week): 5, Weekly Exercise (Minutes/Week): 150, Exercise limited by: None identified  Goals    . Exercise 5x per week (30 min per time)          Patient will start back up walking and doing classes.      Fall Risk Fall Risk  01/17/2017 09/20/2016 08/16/2016 02/16/2016 11/17/2015  Falls in the past year? No No No No No   Depression Screen PHQ 2/9 Scores 01/17/2017 09/20/2016 08/16/2016 02/16/2016  PHQ - 2 Score 0 0 0 0     Cognitive Function MMSE - Mini Mental State Exam 01/17/2017 02/16/2016  Orientation to time 5 5  Orientation to Place 5 5  Registration 3 3  Attention/ Calculation 5 5  Recall 2 3  Language- name 2 objects 2 2  Language- repeat 1 1  Language- follow 3 step command 3 3  Language- read & follow direction 1 1  Write a sentence 1 1  Copy  design 1 1  Total score 29 30        Immunization History  Administered Date(s) Administered  . Influenza, High Dose Seasonal PF 01/18/2015  . Influenza,inj,Quad PF,36+ Mos 04/24/2016  . Pneumococcal Conjugate-13 09/22/2013  . Pneumococcal Polysaccharide-23 09/02/2004, 09/18/2011  . Tdap 09/18/2011  . Zoster 08/19/2008   Screening Tests Health Maintenance  Topic Date Due  . INFLUENZA VACCINE  01/17/2017  . MAMMOGRAM  08/22/2017  . COLONOSCOPY  09/23/2017  . TETANUS/TDAP  09/17/2021  . DEXA SCAN  Completed  . PNA vac Low Risk Adult  Completed      Plan:    I have personally reviewed and addressed the Medicare Annual Wellness questionnaire and have noted the following in the patient's chart:  A. Medical and social history B. Use of alcohol, tobacco or illicit drugs  C. Current medications and supplements D. Functional ability and status E.  Nutritional status F.  Physical activity G. Advance directives H. List of other physicians I.  Hospitalizations, surgeries, and ER visits in previous 12 months J.  [place] to include hearing, vision, cognitive, depression L. Referrals and appointments - none  In addition, I have reviewed and discussed with patient certain preventive protocols, quality metrics, and best practice recommendations. A written personalized care plan for preventive services as well as general preventive health recommendations were provided to patient.  See attached scanned questionnaire for additional information.   Signed,   Rich Reining, RN Nurse Health Advisor   Quick Notes   Health Maintenance: Up to date     Abnormal Screen: 29/30 MMSE, passed clock drawing     Patient Concerns: None     Nurse Concerns: None

## 2017-02-06 ENCOUNTER — Encounter: Payer: Self-pay | Admitting: Internal Medicine

## 2017-02-06 DIAGNOSIS — J309 Allergic rhinitis, unspecified: Secondary | ICD-10-CM | POA: Diagnosis not present

## 2017-02-06 DIAGNOSIS — M810 Age-related osteoporosis without current pathological fracture: Secondary | ICD-10-CM | POA: Diagnosis not present

## 2017-02-06 DIAGNOSIS — E785 Hyperlipidemia, unspecified: Secondary | ICD-10-CM | POA: Diagnosis not present

## 2017-02-06 LAB — LIPID PANEL
Cholesterol: 238 — AB (ref 0–200)
HDL: 96 — AB (ref 35–70)
LDL Cholesterol: 129
Triglycerides: 62 (ref 40–160)

## 2017-02-06 LAB — BASIC METABOLIC PANEL
BUN: 12 (ref 4–21)
Creatinine: 0.7 (ref 0.5–1.1)
Glucose: 92
Potassium: 4.1 (ref 3.4–5.3)
Sodium: 139 (ref 137–147)

## 2017-02-06 LAB — CBC AND DIFFERENTIAL
HCT: 42 (ref 36–46)
Hemoglobin: 13.6 (ref 12.0–16.0)
Platelets: 183 (ref 150–399)
WBC: 3.6

## 2017-02-06 LAB — HEPATIC FUNCTION PANEL
ALT: 17 (ref 7–35)
AST: 24 (ref 13–35)
Alkaline Phosphatase: 49 (ref 25–125)
Bilirubin, Total: 0.5

## 2017-02-09 ENCOUNTER — Encounter: Payer: Self-pay | Admitting: Internal Medicine

## 2017-02-14 ENCOUNTER — Non-Acute Institutional Stay: Payer: Medicare Other | Admitting: Internal Medicine

## 2017-02-14 ENCOUNTER — Encounter: Payer: Self-pay | Admitting: Internal Medicine

## 2017-02-14 VITALS — BP 120/74 | HR 65 | Temp 98.0°F | Ht 61.0 in | Wt 133.0 lb

## 2017-02-14 DIAGNOSIS — I10 Essential (primary) hypertension: Secondary | ICD-10-CM

## 2017-02-14 DIAGNOSIS — M810 Age-related osteoporosis without current pathological fracture: Secondary | ICD-10-CM | POA: Diagnosis not present

## 2017-02-14 DIAGNOSIS — I493 Ventricular premature depolarization: Secondary | ICD-10-CM | POA: Diagnosis not present

## 2017-02-14 DIAGNOSIS — K660 Peritoneal adhesions (postprocedural) (postinfection): Secondary | ICD-10-CM

## 2017-02-14 DIAGNOSIS — F439 Reaction to severe stress, unspecified: Secondary | ICD-10-CM

## 2017-02-14 DIAGNOSIS — E785 Hyperlipidemia, unspecified: Secondary | ICD-10-CM

## 2017-02-14 NOTE — Progress Notes (Signed)
Provider:  Rexene Edison. Mariea Woodard, D.O., C.M.D. Location:  Artist of Service:  Clinic (12)  Previous PCP: Traci Curry, DO Patient Care Team: Traci Curry, DO as PCP - General (Geriatric Medicine) Traci Brownie, MD as Consulting Physician (Dermatology) Traci Fair, MD as Consulting Physician (Gastroenterology) Traci Anis, MD as Referring Physician (Allergy) Traci Margarita, MD as Consulting Physician (Cardiology)  Extended Emergency Contact Information Primary Emergency Contact: Traci Woodard Address: [address]          [place],  [address] Traci Woodard Phone: [phone] Mobile Phone: [phone] Relation: Spouse  Code Status: DNR Goals of Care: Advanced Directive information Advanced Directives 02/14/2017  Does Patient Have a Medical Advance Directive? Yes  Type of Advance Directive [place]  Does patient want to make changes to medical advance directive? -  Copy of [place] in Chart? Yes   Chief Complaint  Patient presents with  . extended visit    extended visit    HPI: Patient is a 76 y.o. female seen today for her extended visit.  She had her annual wellness earlier this month with Traci Woodard.    She spoke with me a lot about some stress in her relationship with her husband and his anxiety.  Also his family is apparently convinced he has dementia though she believes he's always been the absent-minded professor type of man, forgetting routine things, but able to do very well academically, preaching, singing, etc.    She occasionally gets palpitations when she is upset about some of these things and a discomfort over the area where her bowel obstruction surgery had been (adhesions).  EKG today was unremarkable though it was not run properly.    She scored 27/30 on her MMSE with Traci Woodard.  HTN:  Not on meds.  Does walk for exercise.  Hyperlipidemia:   Lab  Results  Component Value Date   CHOL 238 (A) 02/06/2017   CHOL 231 (A) 02/08/2016   CHOL 227 (A) 10/29/2014   Lab Results  Component Value Date   HDL 96 (A) 02/06/2017   HDL 102 (A) 02/08/2016   HDL 97 (A) 10/29/2014   Lab Results  Component Value Date   LDLCALC 129 02/06/2017   LDLCALC 107 02/08/2016   LDLCALC 118 10/29/2014   Lab Results  Component Value Date   TRIG 62 02/06/2017   TRIG 109 02/08/2016   TRIG 60 10/29/2014   No results found for: CHOLHDL No results found for: LDLDIRECT Quite good  Senile osteoporosis:  Doing well with vitamin D, exercise and prolia.  Past Medical History:  Diagnosis Date  . Allergy   . Ankle fracture, left   . Bigeminy   . Cataract   . Colon polyps   . Diverticulosis   . FH: colon cancer   . Menopausal and perimenopausal disorder   . Multiple allergies   . Osteoporosis 2008   PER DEXA SCAN  . PVC (premature ventricular contraction)    Past Surgical History:  Procedure Laterality Date  . APPENDECTOMY    . CATARACT EXTRACTION W/ INTRAOCULAR LENS  IMPLANT, BILATERAL    . PERFORATED APPENDIX    . SMALL BOWEL OBSTRUCTION      reports that she has never smoked. She has never used smokeless tobacco. She reports that she drinks alcohol. She reports that she does not use drugs.  Functional Status Survey:    Family History  Problem Relation  Age of Onset  . Colon cancer Mother   . CAD Father   . Dementia Father   . Heart disease Father   . Colon cancer Brother   . Breast cancer Paternal Aunt     Health Maintenance  Topic Date Due  . INFLUENZA VACCINE  01/17/2017  . MAMMOGRAM  08/22/2017  . COLONOSCOPY  09/23/2017  . TETANUS/TDAP  09/17/2021  . DEXA SCAN  Completed  . PNA vac Low Risk Adult  Completed    Allergies  Allergen Reactions  . Pollen Extract     Allergies as of 02/14/2017      Reactions   Pollen Extract       Medication List       Accurate as of 02/14/17 10:38 AM. Always use your most recent med  list.          Calcium Carb-Ergocalciferol 500-200 MG-UNIT Tabs Take 2 tablets by mouth daily.   denosumab 60 MG/ML Soln injection Commonly known as:  PROLIA Inject 60 mg into the skin every 6 (six) months. Administer in upper arm, thigh, or abdomen   NON FORMULARY Antronex dietary supplement 1 per day   Vitamin D3 2000 units capsule Take 2,000 Units by mouth every other day.            Discharge Care Instructions        Start     Ordered   02/14/17 0000  EKG 12-Lead     02/14/17 1032      Review of Systems  Constitutional: Negative for chills, fever and malaise/fatigue.  HENT: Negative for congestion and hearing loss.   Eyes:       Glasses  Respiratory: Negative for cough and shortness of breath.   Cardiovascular: Positive for palpitations. Negative for chest pain, orthopnea, claudication and leg swelling.  Gastrointestinal: Positive for abdominal pain. Negative for blood in stool, constipation, diarrhea, melena, nausea and vomiting.  Genitourinary: Negative for dysuria.  Musculoskeletal: Negative for back pain, falls, joint pain and myalgias.  Skin: Negative for itching and rash.  Neurological: Negative for dizziness, loss of consciousness and weakness.  Endo/Heme/Allergies: Does not bruise/bleed easily.  Psychiatric/Behavioral: Negative for depression and memory loss. The patient is nervous/anxious. The patient does not have insomnia.     Vitals:   02/14/17 1010  BP: 120/74  Pulse: 65  Temp: 98 F (36.7 C)  TempSrc: Oral  SpO2: 95%  Weight: 133 lb (60.3 kg)  Height: 5\' 1"  (1.549 m)   Body mass index is 25.13 kg/m. Physical Exam  Constitutional: She is oriented to person, place, and time. She appears well-developed and well-nourished. No distress.  HENT:  Head: Normocephalic and atraumatic.  Right Ear: External ear normal.  Left Ear: External ear normal.  Nose: Nose normal.  Mouth/Throat: Oropharynx is clear and moist. No oropharyngeal exudate.    Eyes: Pupils are equal, round, and reactive to light. Conjunctivae and EOM are normal.  glasses  Neck: Normal range of motion. Neck supple. No JVD present. No tracheal deviation present. No thyromegaly present.  Cardiovascular: Normal rate, regular rhythm, normal heart sounds and intact distal pulses.   Pulmonary/Chest: Effort normal and breath sounds normal. No respiratory distress. Right breast exhibits no inverted nipple, no mass, no nipple discharge, no skin change and no tenderness. Left breast exhibits no inverted nipple, no mass, no nipple discharge, no skin change and no tenderness.  Abdominal: Soft. Bowel sounds are normal. She exhibits no distension. There is no tenderness.  Scar from bowel  obstruction due to adhesions with palpable scar tissue beneath  Musculoskeletal: Normal range of motion.  Lymphadenopathy:    She has no cervical adenopathy.    She has no axillary adenopathy.  Neurological: She is alert and oriented to person, place, and time.  Skin: Skin is warm and dry. Capillary refill takes less than 2 seconds.  Psychiatric: She has a normal mood and affect. Her behavior is normal. Judgment and thought content normal.    Labs reviewed: Basic Metabolic Panel:  Recent Labs  02/06/17 0800  NA 139  K 4.1  BUN 12  CREATININE 0.7   Liver Function Tests:  Recent Labs  02/06/17 0800  AST 24  ALT 17  ALKPHOS 49   No results for input(s): LIPASE, AMYLASE in the last 8760 hours. No results for input(s): AMMONIA in the last 8760 hours. CBC:  Recent Labs  02/06/17 0800  WBC 3.6  HGB 13.6  HCT 42  PLT 183   Cardiac Enzymes: No results for input(s): CKTOTAL, CKMB, CKMBINDEX, TROPONINI in the last 8760 hours. BNP: Invalid input(s): POCBNP Lab Results  Component Value Date   HGBA1C 5.4 10/29/2014   Lab Results  Component Value Date   TSH 1.84 10/29/2014   No results found for: VITAMINB12 No results found for: FOLATE No results found for: IRON, TIBC,  FERRITIN  Imaging and Procedures Recently: EKG normal sinus rhythm at 56 bpm reviewed by me (rhythm strips)  Assessment/Plan 1. Frequent PVCs -not seen on ekg today, but some heard during exam and pt reports these historically with past providers - EKG 12-Lead -related to anxiety and stress at home  2. Essential hypertension, benign -bp well controlled with diet and exercise, cont   3. Senile osteoporosis -cont prolia twice a year -last bone density showed osteoporosis and we started this  4. Intestinal adhesions -with prior obstruction -gets pediatric cscopes -no recent problems  5. Hyperlipidemia, unspecified hyperlipidemia type -cont diet and exercise, plans to step this up a notch to deal with #6 and help with her cholesterol values  6. Stress at home Related to her husband  -cont to see counselor and seek out individual activities, exercise for stress relief  Labs/tests ordered:  EKG 6 mos med mgt  Traci Woodard L. Creston Klas, D.O. [facility] [address], [place] [address] Cell Phone (Mon-Fri 8am-5pm):  [phone] On Call:  [phone] & follow prompts after 5pm & weekends Office Phone:  [phone] Office Fax:  [phone]

## 2017-04-12 DIAGNOSIS — Z23 Encounter for immunization: Secondary | ICD-10-CM | POA: Diagnosis not present

## 2017-05-01 ENCOUNTER — Ambulatory Visit: Payer: Self-pay

## 2017-05-08 ENCOUNTER — Ambulatory Visit: Payer: Medicare Other | Admitting: *Deleted

## 2017-05-08 DIAGNOSIS — M810 Age-related osteoporosis without current pathological fracture: Secondary | ICD-10-CM | POA: Diagnosis not present

## 2017-05-08 MED ORDER — DENOSUMAB 60 MG/ML ~~LOC~~ SOLN
60.0000 mg | Freq: Once | SUBCUTANEOUS | Status: AC
  Administered 2017-05-08: 60 mg via SUBCUTANEOUS

## 2017-07-23 ENCOUNTER — Other Ambulatory Visit: Payer: Self-pay | Admitting: Internal Medicine

## 2017-07-23 DIAGNOSIS — Z1231 Encounter for screening mammogram for malignant neoplasm of breast: Secondary | ICD-10-CM

## 2017-08-15 ENCOUNTER — Non-Acute Institutional Stay: Payer: Medicare Other | Admitting: Internal Medicine

## 2017-08-15 ENCOUNTER — Encounter: Payer: Self-pay | Admitting: Internal Medicine

## 2017-08-15 VITALS — BP 122/70 | HR 77 | Temp 98.7°F | Wt 134.0 lb

## 2017-08-15 DIAGNOSIS — F439 Reaction to severe stress, unspecified: Secondary | ICD-10-CM | POA: Diagnosis not present

## 2017-08-15 DIAGNOSIS — K660 Peritoneal adhesions (postprocedural) (postinfection): Secondary | ICD-10-CM | POA: Diagnosis not present

## 2017-08-15 DIAGNOSIS — M810 Age-related osteoporosis without current pathological fracture: Secondary | ICD-10-CM | POA: Diagnosis not present

## 2017-08-15 DIAGNOSIS — I10 Essential (primary) hypertension: Secondary | ICD-10-CM

## 2017-08-15 DIAGNOSIS — E785 Hyperlipidemia, unspecified: Secondary | ICD-10-CM

## 2017-08-15 NOTE — Progress Notes (Signed)
Location:  Occupational psychologist of Service:  Clinic (12)  Provider: Xoe Hoe L. Mariea Clonts, D.O., C.M.D.  Code Status: DNR Goals of Care:  Advanced Directives 08/15/2017  Does Patient Have a Medical Advance Directive? Yes  Type of Paramedic of [place];Out of facility DNR (pink MOST or yellow form)  Does patient want to make changes to medical advance directive? No - Patient declined  Copy of [place] in Chart? Yes  Pre-existing out of facility DNR order (yellow form or pink MOST form) Yellow form placed in chart (order not valid for inpatient use)     Chief Complaint  Patient presents with  . Medical Management of Chronic Issues    65mth follow-up  . ACP    DNR, HCPOA    HPI: Patient is a 77 y.o. female seen today for medical management of chronic diseases.    BP at goal.    Her husband's anxiety plays a role in her health.  They have gotten someone to take over the finances b/c he was anxious and making errors.  They have a plan to visit their daughter in [place] for her 50th bday.  She sleeps better now.  If things are not good, she wakes up at night with palpitations, chest discomfort and abdominal pain--much less now.  Says her relationship affects her nerves tremendously.    Senile osteoporosis:  On prolia, ca with D and D3 supplement.  Has no complaints.    She takes antronex dietary supplement which I looked at and it did not concern me in the past.    Seeing well.  Has had to move Dr. Benna Dunks appt due to travel.  Hearing well.    Bowels moving.  No bladder troubles.  Urinates slowly but not new or different.    No aches or pains.    Has her mammogram scheduled for March.    Cholesterol:  Has high LDL but great HDL.   Past Medical History:  Diagnosis Date  . Allergy   . Ankle fracture, left   . Bigeminy   . Cataract   . Colon polyps   . Diverticulosis   . FH: colon cancer   . Menopausal and  perimenopausal disorder   . Multiple allergies   . Osteoporosis 2008   PER DEXA SCAN  . PVC (premature ventricular contraction)     Past Surgical History:  Procedure Laterality Date  . APPENDECTOMY    . CATARACT EXTRACTION W/ INTRAOCULAR LENS  IMPLANT, BILATERAL    . PERFORATED APPENDIX    . SMALL BOWEL OBSTRUCTION      Allergies  Allergen Reactions  . Pollen Extract     Outpatient Encounter Medications as of 08/15/2017  Medication Sig  . Calcium Carb-Ergocalciferol 500-200 MG-UNIT TABS Take 2 tablets by mouth daily.   . Cholecalciferol (VITAMIN D3) 2000 units capsule Take 2,000 Units by mouth every other day.  . denosumab (PROLIA) 60 MG/ML SOLN injection Inject 60 mg into the skin every 6 (six) months. Administer in upper arm, thigh, or abdomen  . NON FORMULARY Antronex dietary supplement 1 per day   No facility-administered encounter medications on file as of 08/15/2017.     Review of Systems:  Review of Systems  Constitutional: Negative for chills, fever and malaise/fatigue.  HENT: Negative for congestion and hearing loss.   Eyes: Negative for blurred vision.       Glasses  Respiratory: Negative for shortness of breath.  Cardiovascular: Negative for chest pain, palpitations and leg swelling.  Gastrointestinal: Negative for abdominal pain, blood in stool, constipation, diarrhea and melena.  Genitourinary: Negative for dysuria.  Musculoskeletal: Negative for falls and joint pain.  Skin: Negative for itching and rash.  Neurological: Negative for dizziness, loss of consciousness and weakness.  Endo/Heme/Allergies: Bruises/bleeds easily.  Psychiatric/Behavioral: Negative for depression and memory loss. The patient is nervous/anxious. The patient does not have insomnia.     Health Maintenance  Topic Date Due  . MAMMOGRAM  08/22/2017  . COLONOSCOPY  09/23/2017  . TETANUS/TDAP  09/17/2021  . INFLUENZA VACCINE  Completed  . DEXA SCAN  Completed  . PNA vac Low Risk Adult   Completed    Physical Exam: Vitals:   08/15/17 0859  BP: 122/70  Pulse: 77  Temp: 98.7 F (37.1 C)  TempSrc: Oral  SpO2: 97%  Weight: 134 lb (60.8 kg)   Body mass index is 25.32 kg/m. Physical Exam  Constitutional: She is oriented to person, place, and time. She appears well-developed and well-nourished. No distress.  HENT:  Head: Normocephalic and atraumatic.  Cardiovascular: Normal rate, regular rhythm, normal heart sounds and intact distal pulses.  Pulmonary/Chest: Effort normal and breath sounds normal. No respiratory distress.  Abdominal: Soft. Bowel sounds are normal.  Musculoskeletal: Normal range of motion. She exhibits no edema or tenderness.  Neurological: She is alert and oriented to person, place, and time.  Skin: Skin is warm and dry.  Psychiatric: She has a normal mood and affect.    Labs reviewed: Basic Metabolic Panel: Recent Labs    02/06/17 0800  NA 139  K 4.1  BUN 12  CREATININE 0.7   Liver Function Tests: Recent Labs    02/06/17 0800  AST 24  ALT 17  ALKPHOS 49   No results for input(s): LIPASE, AMYLASE in the last 8760 hours. No results for input(s): AMMONIA in the last 8760 hours. CBC: Recent Labs    02/06/17 0800  WBC 3.6  HGB 13.6  HCT 42  PLT 183   Lipid Panel: Recent Labs    02/06/17 0800  CHOL 238*  HDL 96*  LDLCALC 129  TRIG 62   Lab Results  Component Value Date   HGBA1C 5.4 10/29/2014    Assessment/Plan 1. Essential hypertension, benign -bp at goal w/o meds, only healthy diet and exercise program, doing great  2. Senile osteoporosis -cont ca with D, D3 and prolia therapy, no dental issues reported but her dentist always asks  3. Intestinal adhesions -h/o of from ruptured appendix/peritonitis -bowels moving well without obstruction or constipation concerns  4. Hyperlipidemia, unspecified hyperlipidemia type -ongoing elevated LDL, but HDL quite good -cont exercise program and healthy diet  5. Stress at  home -due to anxiety related to her husband forgetting at times and being anxious himself, but doing much better since neither of them are having to manage the finances any longer -sleeping better, not having palpitations or chest or abdominal pains like last time  Labs/tests ordered:  Cbc, cmp, flp  Next appt: 6 mos for annual with labs before  Fawna Cranmer L. Karen Huhta, D.O. [facility] [address], [place] [address] Cell Phone (Mon-Fri 8am-5pm):  [phone] On Call:  [phone] & follow prompts after 5pm & weekends Office Phone:  [phone] Office Fax:  [phone]

## 2017-08-24 ENCOUNTER — Ambulatory Visit: Payer: Medicare Other

## 2017-08-29 ENCOUNTER — Ambulatory Visit: Payer: Medicare Other

## 2017-09-17 ENCOUNTER — Ambulatory Visit: Payer: Medicare Other

## 2017-10-02 ENCOUNTER — Telehealth: Payer: Self-pay | Admitting: *Deleted

## 2017-10-02 DIAGNOSIS — Z860100 Personal history of colon polyps, unspecified: Secondary | ICD-10-CM

## 2017-10-02 NOTE — Telephone Encounter (Signed)
Referral entered  

## 2017-10-02 NOTE — Telephone Encounter (Signed)
Patient called and stated that she thinks it is time for her Colonoscopy. 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 Stated she could not remember who done it. Would like to get referral. Please Advise.

## 2017-10-19 ENCOUNTER — Ambulatory Visit
Admission: RE | Admit: 2017-10-19 | Discharge: 2017-10-19 | Disposition: A | Discharge status: Home / Self Care | Payer: Medicare Other | Source: Ambulatory Visit | Attending: Internal Medicine | Admitting: Internal Medicine

## 2017-10-19 DIAGNOSIS — Z1231 Encounter for screening mammogram for malignant neoplasm of breast: Secondary | ICD-10-CM

## 2017-10-22 ENCOUNTER — Encounter: Payer: Self-pay | Admitting: *Deleted

## 2017-10-24 ENCOUNTER — Non-Acute Institutional Stay: Payer: Medicare Other | Admitting: Internal Medicine

## 2017-10-24 ENCOUNTER — Encounter: Payer: Self-pay | Admitting: Internal Medicine

## 2017-10-24 DIAGNOSIS — F419 Anxiety disorder, unspecified: Secondary | ICD-10-CM
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
## 2017-11-13 ENCOUNTER — Ambulatory Visit: Payer: Medicare Other | Admitting: *Deleted

## 2017-11-13 DIAGNOSIS — M810 Age-related osteoporosis without current pathological fracture: Secondary | ICD-10-CM | POA: Diagnosis not present

## 2017-11-13 MED ORDER — DENOSUMAB 60 MG/ML ~~LOC~~ SOSY
60.0000 mg | PREFILLED_SYRINGE | Freq: Once | SUBCUTANEOUS | Status: AC
  Administered 2017-11-13: 60 mg via SUBCUTANEOUS

## 2017-11-16 DIAGNOSIS — Z961 Presence of intraocular lens: Secondary | ICD-10-CM | POA: Diagnosis not present

## 2017-11-16 DIAGNOSIS — H353131 Nonexudative age-related macular degeneration, bilateral, early dry stage: Secondary | ICD-10-CM | POA: Diagnosis not present

## 2017-11-16 DIAGNOSIS — H52203 Unspecified astigmatism, bilateral: Secondary | ICD-10-CM | POA: Diagnosis not present

## 2017-12-03 DIAGNOSIS — Z86010 Personal history of colonic polyps: Secondary | ICD-10-CM | POA: Diagnosis not present

## 2017-12-03 DIAGNOSIS — K621 Rectal polyp: Secondary | ICD-10-CM | POA: Diagnosis not present

## 2017-12-03 DIAGNOSIS — K640 First degree hemorrhoids: Secondary | ICD-10-CM | POA: Diagnosis not present

## 2017-12-03 DIAGNOSIS — K5730 Diverticulosis of large intestine without perforation or abscess without bleeding: Secondary | ICD-10-CM | POA: Diagnosis not present

## 2017-12-03 DIAGNOSIS — K635 Polyp of colon: Secondary | ICD-10-CM | POA: Diagnosis not present

## 2017-12-03 DIAGNOSIS — K644 Residual hemorrhoidal skin tags: Secondary | ICD-10-CM | POA: Diagnosis not present

## 2017-12-03 DIAGNOSIS — Z1211 Encounter for screening for malignant neoplasm of colon: Secondary | ICD-10-CM | POA: Diagnosis not present

## 2017-12-03 DIAGNOSIS — M810 Age-related osteoporosis without current pathological fracture: Secondary | ICD-10-CM | POA: Diagnosis not present

## 2017-12-03 LAB — HM COLONOSCOPY

## 2018-02-12 DIAGNOSIS — I10 Essential (primary) hypertension: Secondary | ICD-10-CM | POA: Diagnosis not present

## 2018-02-12 DIAGNOSIS — E785 Hyperlipidemia, unspecified: Secondary | ICD-10-CM | POA: Diagnosis not present

## 2018-02-12 LAB — HEPATIC FUNCTION PANEL
ALT: 25 (ref 7–35)
AST: 28 (ref 13–35)
Alkaline Phosphatase: 71 (ref 25–125)
Bilirubin, Total: 0.5

## 2018-02-12 LAB — BASIC METABOLIC PANEL
BUN: 12 (ref 4–21)
Creatinine: 0.7 (ref 0.5–1.1)
Glucose: 93
Potassium: 4.2 (ref 3.4–5.3)
Sodium: 142 (ref 137–147)

## 2018-02-12 LAB — LIPID PANEL
Cholesterol: 233 — AB (ref 0–200)
HDL: 76 — AB (ref 35–70)
LDL Cholesterol: 141
Triglycerides: 83 (ref 40–160)

## 2018-02-12 LAB — CBC AND DIFFERENTIAL
HCT: 39 (ref 36–46)
Hemoglobin: 12.9 (ref 12.0–16.0)
Platelets: 152 (ref 150–399)
WBC: 3.8

## 2018-02-13 ENCOUNTER — Encounter: Payer: Self-pay | Admitting: Internal Medicine

## 2018-02-20 ENCOUNTER — Encounter: Payer: Self-pay | Admitting: Internal Medicine

## 2018-02-26 ENCOUNTER — Non-Acute Institutional Stay: Payer: Medicare Other

## 2018-02-26 VITALS — BP 124/72 | HR 70 | Temp 98.4°F | Ht 61.0 in | Wt 136.0 lb

## 2018-02-26 DIAGNOSIS — Z0000 Encounter for general adult medical examination without abnormal findings: Secondary | ICD-10-CM

## 2018-02-26 NOTE — Progress Notes (Signed)
Subjective:   Traci Woodard is a 77 y.o. female who presents for Medicare Annual (Subsequent) preventive examination at [facility]  Last AWV-01/17/2017    Objective:     Vitals: BP 124/72 (BP Location: Left Arm, Patient Position: Sitting)   Pulse 70   Temp 98.4 F (36.9 C) (Oral)   Ht 5\' 1"  (1.549 m)   Wt 136 lb (61.7 kg)   SpO2 96%   BMI 25.70 kg/m   Body mass index is 25.7 kg/m.  Advanced Directives 02/26/2018 08/15/2017 02/14/2017 01/17/2017 09/20/2016 08/16/2016 02/16/2016  Does Patient Have a Medical Advance Directive? Yes Yes Yes Yes Yes Yes Yes  Type of Paramedic of [place];Out of facility DNR (pink MOST or yellow form) [place];Out of facility DNR (pink MOST or yellow form) Healthcare Power of [place];Living will Healthcare Power of [place]  Does patient want to make changes to medical advance directive? No - Patient declined No - Patient declined - No - Patient declined - - -  Copy of [place] in Chart? Yes Yes Yes Yes Yes Yes Yes  Pre-existing out of facility DNR order (yellow form or pink MOST form) Yellow form placed in chart (order not valid for inpatient use) Yellow form placed in chart (order not valid for inpatient use) - - - - -    Tobacco Social History   Tobacco Use  Smoking Status Never Smoker  Smokeless Tobacco Never Used     Counseling given: Not Answered   Clinical Intake:  Pre-visit preparation completed: No  Pain : No/denies pain     Nutritional Risks: None Diabetes: No  How often do you need to have someone help you when you read instructions, pamphlets, or other written materials from your doctor or pharmacy?: 1 - Never What is the last grade level you completed in school?: Bachelor  Interpreter Needed?: No  Information entered by :: Tyson Dense, RN  Past  Medical History:  Diagnosis Date  . Allergy   . Ankle fracture, left   . Bigeminy   . Cataract   . Colon polyps   . Diverticulosis   . FH: colon cancer   . Menopausal and perimenopausal disorder   . Multiple allergies   . Osteoporosis 2008   PER DEXA SCAN  . PVC (premature ventricular contraction)    Past Surgical History:  Procedure Laterality Date  . APPENDECTOMY    . CATARACT EXTRACTION W/ INTRAOCULAR LENS  IMPLANT, BILATERAL    . PERFORATED APPENDIX    . SMALL BOWEL OBSTRUCTION     Family History  Problem Relation Age of Onset  . Colon cancer Mother   . CAD Father   . Dementia Father   . Heart disease Father   . Colon cancer Brother   . Breast cancer Paternal Aunt    Social History   Socioeconomic History  . Marital status: Married    Spouse name: Not on file  . Number of children: Not on file  . Years of education: Not on file  . Highest education level: Not on file  Occupational History  . Occupation: Retired    Comment: Barrister's clerk Needs  . Financial resource strain: Not hard at all  . Food insecurity:    Worry: Never true    Inability: Never true  . Transportation needs:    Medical: No    Non-medical:  No  Tobacco Use  . Smoking status: Never Smoker  . Smokeless tobacco: Never Used  Substance and Sexual Activity  . Alcohol use: Yes    Comment: glass of wine every day  . Drug use: No  . Sexual activity: Not on file  Lifestyle  . Physical activity:    Days per week: 7 days    Minutes per session: 20 min  . Stress: Only a little  Relationships  . Social connections:    Talks on phone: More than three times a week    Gets together: More than three times a week    Attends religious service: More than 4 times per year    Active member of club or organization: No    Attends meetings of clubs or organizations: Never    Relationship status: Married  Other Topics Concern  . Not on file  Social History Narrative   Tobacco use, amount per day  now:0   Past tobacco use, amount per day:0   How many years did you use tobacco:0   Alcohol use (drinks per week):7-14   Diet:FRUITS, VEGETABLES, YOGURT, LEAN MEAT AND FISH, FEW BREADS AND DESSERTS    Do you drink/eat things with caffeine:YES, COFFEE, CHOCOLATE   Marital status: MARRIED                               What year were you married? 1980   Do you live in a house, apartment, assisted living, condo, trailer, etc.? [place]   Is it one or more stories? ONE   How many persons live in your home? 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   YES                               Type and how often? WALK AND STRETCH DAILY   Do you have a living will? YES   Do you have a DNR form?  I THINK SO              If not, do you want to discuss one?   Do you have signed POA/HPOA forms?  YES                      If so, please bring to you appointment    Outpatient Encounter Medications as of 02/26/2018  Medication Sig  . Calcium Carb-Ergocalciferol 500-200 MG-UNIT TABS Take 2 tablets by mouth daily.   . Cholecalciferol (VITAMIN D3) 2000 units capsule Take 2,000 Units by mouth every other day.  . denosumab (PROLIA) 60 MG/ML SOLN injection Inject 60 mg into the skin every 6 (six) months. Administer in upper arm, thigh, or abdomen  . NON FORMULARY Antronex dietary supplement 1 per day  . NON FORMULARY Celebrex once a day   No facility-administered encounter medications on file as of 02/26/2018.     Activities of Daily Living In your present state of health, do you have any difficulty performing the following activities: 02/26/2018  Hearing? N  Vision? N  Difficulty concentrating or making decisions? N  Walking or climbing stairs? N  Dressing or bathing? N  Doing errands, shopping? N  Preparing Food and  eating ? N  Using the Toilet? N  In the past six months, have you accidently leaked urine? Y  Do you have problems with loss of bowel control? N  Managing your Medications? N  Managing your Finances? N  Housekeeping or managing your Housekeeping? N  Some recent data might be hidden    Patient Care Team: Gayland Curry, DO as PCP - General (Geriatric Medicine) Druscilla Brownie, MD as Consulting Physician (Dermatology) Garlan Fair, MD as Consulting Physician (Gastroenterology) Mosetta Anis, MD as Referring Physician (Allergy) Sueanne Margarita, MD as Consulting Physician (Cardiology)    Assessment:   This is a routine wellness examination for Kierstan.  Exercise Activities and Dietary recommendations Current Exercise Habits: Structured exercise class, Type of exercise: Other - see comments(water aerobics), Time (Minutes): 30, Frequency (Times/Week): 3, Weekly Exercise (Minutes/Week): 90  Goals      Patient Stated   . Patient Stated (pt-stated)     I will start going to water aerobics 5 days a week      Other   . Exercise 5x per week (30 min per time)     Patient will start back up walking and doing classes.       Fall Risk Fall Risk  02/26/2018 10/24/2017 08/15/2017 01/17/2017 09/20/2016  Falls in the past year? No No No No No   Is the patient's home free of loose throw rugs in walkways, pet beds, electrical cords, etc?   yes      Grab bars in the bathroom? yes      Handrails on the stairs?   yes      Adequate lighting?   yes  Depression Screen PHQ 2/9 Scores 02/26/2018 10/24/2017 08/15/2017 01/17/2017  PHQ - 2 Score 0 0 0 0     Cognitive Function MMSE - Mini Mental State Exam 02/26/2018 01/17/2017 02/16/2016  Orientation to time 5 5 5   Orientation to Place 5 5 5   Registration 3 3 3   Attention/ Calculation 5 5 5   Recall 3 2 3   Language- name 2 objects 2 2 2   Language- repeat 1 1 1   Language- follow 3 step command 3 3 3   Language- read & follow direction 1 1 1   Write a  sentence 1 1 1   Copy design 1 1 1   Total score 30 29 30         Immunization History  Administered Date(s) Administered  . Influenza, High Dose Seasonal PF 01/18/2015  . Influenza,inj,Quad PF,6+ Mos 04/24/2016  . Influenza-Unspecified 04/09/2017  . Pneumococcal Conjugate-13 09/22/2013  . Pneumococcal Polysaccharide-23 09/02/2004, 09/18/2011  . Tdap 09/18/2011  . Zoster 08/19/2008  . Zoster Recombinat (Shingrix) 08/21/2017, 11/12/2017    Qualifies for Shingles Vaccine? Up to date, completed  Screening Tests Health Maintenance  Topic Date Due  . COLONOSCOPY  09/23/2017  . INFLUENZA VACCINE  01/17/2018  . MAMMOGRAM  10/20/2018  . TETANUS/TDAP  09/17/2021  . DEXA SCAN  Completed  . PNA vac Low Risk Adult  Completed    Cancer Screenings: Lung: Low Dose CT Chest recommended if Age 56-80 years, 30 pack-year currently smoking OR have quit w/in 15years. Patient does not qualify. Breast:  Up to date on Mammogram? Yes   Up to date of Bone Density/Dexa? Yes Colorectal: up to date  Additional Screenings:  Hepatitis C Screening: declined Flu vaccine due: will receive at [facility]:    I have personally reviewed  and addressed the Medicare Annual Wellness questionnaire and have noted the following in the patient's chart:  A. Medical and social history B. Use of alcohol, tobacco or illicit drugs  C. Current medications and supplements D. Functional ability and status E.  Nutritional status F.  Physical activity G. Advance directives H. List of other physicians I.  Hospitalizations, surgeries, and ER visits in previous 12 months J.  [place] to include hearing, vision, cognitive, depression L. Referrals and appointments - none  In addition, I have reviewed and discussed with patient certain preventive protocols, quality metrics, and best practice recommendations. A written personalized care plan for preventive services as well as general preventive health  recommendations were provided to patient.  See attached scanned questionnaire for additional information.   Signed,   Tyson Dense, RN Nurse Health Advisor  Patient concerns: None

## 2018-02-26 NOTE — Patient Instructions (Signed)
Traci Woodard , Thank you for taking time to come for your Medicare Wellness Visit. I appreciate your ongoing commitment to your health goals. Please review the following plan we discussed and let me know if I can assist you in the future.   Screening recommendations/referrals: Colonoscopy excluded, over age 78 Mammogram excluded, over age 32 Bone Density up to date Recommended yearly ophthalmology/optometry visit for glaucoma screening and checkup Recommended yearly dental visit for hygiene and checkup  Vaccinations: Influenza vaccine due, will get at [facility] Pneumococcal vaccine up to date, completed Tdap vaccine up to date, due 09/17/2021 Shingles vaccine up to date, completed    Advanced directives: in chart  Conditions/risks identified: none  Next appointment: Dr. Mariea Clonts 02/27/2018 @ 2:30pm   Preventive Care 89 Years and Older, Female Preventive care refers to lifestyle choices and visits with your health care provider that can promote health and wellness. What does preventive care include?  A yearly physical exam. This is also called an annual well check.  Dental exams once or twice a year.  Routine eye exams. Ask your health care provider how often you should have your eyes checked.  Personal lifestyle choices, including:  Daily care of your teeth and gums.  Regular physical activity.  Eating a healthy diet.  Avoiding tobacco and drug use.  Limiting alcohol use.  Practicing safe sex.  Taking low-dose aspirin every day.  Taking vitamin and mineral supplements as recommended by your health care provider. What happens during an annual well check? The services and screenings done by your health care provider during your annual well check will depend on your age, overall health, lifestyle risk factors, and family history of disease. Counseling  Your health care provider may ask you questions about your:  Alcohol use.  Tobacco use.  Drug use.  Emotional  well-being.  Home and relationship well-being.  Sexual activity.  Eating habits.  History of falls.  Memory and ability to understand (cognition).  Work and work Statistician.  Reproductive health. Screening  You may have the following tests or measurements:  Height, weight, and BMI.  Blood pressure.  Lipid and cholesterol levels. These may be checked every 5 years, or more frequently if you are over 45 years old.  Skin check.  Lung cancer screening. You may have this screening every year starting at age 49 if you have a 30-pack-year history of smoking and currently smoke or have quit within the past 15 years.  Fecal occult blood test (FOBT) of the stool. You may have this test every year starting at age 37.  Flexible sigmoidoscopy or colonoscopy. You may have a sigmoidoscopy every 5 years or a colonoscopy every 10 years starting at age 28.  Hepatitis C blood test.  Hepatitis B blood test.  Sexually transmitted disease (STD) testing.  Diabetes screening. This is done by checking your blood sugar (glucose) after you have not eaten for a while (fasting). You may have this done every 1-3 years.  Bone density scan. This is done to screen for osteoporosis. You may have this done starting at age 56.  Mammogram. This may be done every 1-2 years. Talk to your health care provider about how often you should have regular mammograms. Talk with your health care provider about your test results, treatment options, and if necessary, the need for more tests. Vaccines  Your health care provider may recommend certain vaccines, such as:  Influenza vaccine. This is recommended every year.  Tetanus, diphtheria, and acellular pertussis (Tdap, Td)  vaccine. You may need a Td booster every 10 years.  Zoster vaccine. You may need this after age 42.  Pneumococcal 13-valent conjugate (PCV13) vaccine. One dose is recommended after age 4.  Pneumococcal polysaccharide (PPSV23) vaccine. One  dose is recommended after age 2. Talk to your health care provider about which screenings and vaccines you need and how often you need them. This information is not intended to replace advice given to you by your health care provider. Make sure you discuss any questions you have with your health care provider. Document Released: 07/02/2015 Document Revised: 02/23/2016 Document Reviewed: 04/06/2015 Elsevier Interactive Patient Education  2017 [place] Prevention in the Home Falls can cause injuries. They can happen to people of all ages. There are many things you can do to make your home safe and to help prevent falls. What can I do on the outside of my home?  Regularly fix the edges of walkways and driveways and fix any cracks.  Remove anything that might make you trip as you walk through a door, such as a raised step or threshold.  Trim any bushes or trees on the path to your home.  Use bright outdoor lighting.  Clear any walking paths of anything that might make someone trip, such as rocks or tools.  Regularly check to see if handrails are loose or broken. Make sure that both sides of any steps have handrails.  Any raised decks and porches should have guardrails on the edges.  Have any leaves, snow, or ice cleared regularly.  Use sand or salt on walking paths during winter.  Clean up any spills in your garage right away. This includes oil or grease spills. What can I do in the bathroom?  Use night lights.  Install grab bars by the toilet and in the tub and shower. Do not use towel bars as grab bars.  Use non-skid mats or decals in the tub or shower.  If you need to sit down in the shower, use a plastic, non-slip stool.  Keep the floor dry. Clean up any water that spills on the floor as soon as it happens.  Remove soap buildup in the tub or shower regularly.  Attach bath mats securely with double-sided non-slip rug tape.  Do not have throw rugs and other  things on the floor that can make you trip. What can I do in the bedroom?  Use night lights.  Make sure that you have a light by your bed that is easy to reach.  Do not use any sheets or blankets that are too big for your bed. They should not hang down onto the floor.  Have a firm chair that has side arms. You can use this for support while you get dressed.  Do not have throw rugs and other things on the floor that can make you trip. What can I do in the kitchen?  Clean up any spills right away.  Avoid walking on wet floors.  Keep items that you use a lot in easy-to-reach places.  If you need to reach something above you, use a strong step stool that has a grab bar.  Keep electrical cords out of the way.  Do not use floor polish or wax that makes floors slippery. If you must use wax, use non-skid floor wax.  Do not have throw rugs and other things on the floor that can make you trip. What can I do with my stairs?  Do not leave  any items on the stairs.  Make sure that there are handrails on both sides of the stairs and use them. Fix handrails that are broken or loose. Make sure that handrails are as long as the stairways.  Check any carpeting to make sure that it is firmly attached to the stairs. Fix any carpet that is loose or worn.  Avoid having throw rugs at the top or bottom of the stairs. If you do have throw rugs, attach them to the floor with carpet tape.  Make sure that you have a light switch at the top of the stairs and the bottom of the stairs. If you do not have them, ask someone to add them for you. What else can I do to help prevent falls?  Wear shoes that:  Do not have high heels.  Have rubber bottoms.  Are comfortable and fit you well.  Are closed at the toe. Do not wear sandals.  If you use a stepladder:  Make sure that it is fully opened. Do not climb a closed stepladder.  Make sure that both sides of the stepladder are locked into place.  Ask  someone to hold it for you, if possible.  Clearly mark and make sure that you can see:  Any grab bars or handrails.  First and last steps.  Where the edge of each step is.  Use tools that help you move around (mobility aids) if they are needed. These include:  Canes.  Walkers.  Scooters.  Crutches.  Turn on the lights when you go into a dark area. Replace any light bulbs as soon as they burn out.  Set up your furniture so you have a clear path. Avoid moving your furniture around.  If any of your floors are uneven, fix them.  If there are any pets around you, be aware of where they are.  Review your medicines with your doctor. Some medicines can make you feel dizzy. This can increase your chance of falling. Ask your doctor what other things that you can do to help prevent falls. This information is not intended to replace advice given to you by your health care provider. Make sure you discuss any questions you have with your health care provider. Document Released: 04/01/2009 Document Revised: 11/11/2015 Document Reviewed: 07/10/2014 Elsevier Interactive Patient Education  2017 [employer].

## 2018-02-27 ENCOUNTER — Encounter: Payer: Medicare Other | Admitting: Internal Medicine

## 2018-02-27 ENCOUNTER — Encounter: Payer: Self-pay | Admitting: Internal Medicine

## 2018-02-27 ENCOUNTER — Non-Acute Institutional Stay: Payer: Medicare Other | Admitting: Internal Medicine

## 2018-02-27 VITALS — BP 120/70 | HR 71 | Temp 98.7°F | Ht 61.0 in | Wt 134.0 lb

## 2018-02-27 DIAGNOSIS — I10 Essential (primary) hypertension: Secondary | ICD-10-CM | POA: Diagnosis not present

## 2018-02-27 DIAGNOSIS — F419 Anxiety disorder, unspecified: Secondary | ICD-10-CM | POA: Diagnosis not present

## 2018-02-27 DIAGNOSIS — G44209 Tension-type headache, unspecified, not intractable: Secondary | ICD-10-CM

## 2018-02-27 DIAGNOSIS — Z638 Other specified problems related to primary support group: Secondary | ICD-10-CM | POA: Diagnosis not present

## 2018-02-27 DIAGNOSIS — E7800 Pure hypercholesterolemia, unspecified: Secondary | ICD-10-CM

## 2018-02-27 DIAGNOSIS — Z86010 Personal history of colonic polyps: Secondary | ICD-10-CM | POA: Diagnosis not present

## 2018-02-27 DIAGNOSIS — M810 Age-related osteoporosis without current pathological fracture: Secondary | ICD-10-CM | POA: Diagnosis not present

## 2018-02-27 NOTE — Progress Notes (Signed)
Location:  [facility] clinic Provider:  Piper Hassebrock L. Mariea Clonts, D.O., C.M.D.  Code Status: DNR Goals of Care:  Advanced Directives 02/26/2018  Does Patient Have a Medical Advance Directive? Yes  Type of Paramedic of [place];Out of facility DNR (pink MOST or yellow form)  Does patient want to make changes to medical advance directive? No - Patient declined  Copy of [place] in Chart? Yes  Pre-existing out of facility DNR order (yellow form or pink MOST form) Yellow form placed in chart (order not valid for inpatient use)     Chief Complaint  Patient presents with  . Annual Exam    CPE    HPI: Patient is a 77 y.o. female seen today for an extended visit--medical management of chronic diseases.   She feels like she and her husband are doing great.   There has been a lot of family sickness.  Says Marden Noble was really anxious during it. His brother was the one who was ill and had a valve surgery.  Her husband, Marden Noble, puts her down at times.  She thanked him for taking on the family demons.  He said that everything she says to him is a Tourist information centre manager of his faults.  She tore it up, then he scotch taped it back together.  He cried and cried and so did she.  He did apologize, "I'm so sorry."  During this, her face hurt so bad and it felt like someone was squeezing her left side of her face and it was going down her left arm.  It hurt for an hour.  She was spent.  She could not bring herself to go to the [employer] or church the next day.  She did sleep but couldn't go to the Sunday afternoon plans either.  She didn't talk to anyone or email anyone which was unusual for her.  When she brought it up again, he got a stricken look.  She had just a little bit of chest pain during it.  No abdominal pain this time.  No palpitations.  It was not a jaw pain, more of a headache.    She and Marden Noble started swimming this morning.  They loved it this one time.    She's more of a  savory eater.  Does love the [place] kettle cooked potato chips.   She has fried chicken and beef both just once a week.  Eats a lot of salads and veggies.  She actually does not eat dressing on her salad.  She thinks she was sitting around more perhaps. A lot of evening activities involve sitting.  She otherwise is on the move quite a bit.    She's going to quit drinking wine.  Says her motivation is that when there is more stuff in the air, she does better to not drink wine.  Says she can take it or leave it.    Past Medical History:  Diagnosis Date  . Allergy   . Ankle fracture, left   . Bigeminy   . Cataract   . Colon polyps   . Diverticulosis   . FH: colon cancer   . Menopausal and perimenopausal disorder   . Multiple allergies   . Osteoporosis 2008   PER DEXA SCAN  . PVC (premature ventricular contraction)     Past Surgical History:  Procedure Laterality Date  . APPENDECTOMY    . CATARACT EXTRACTION W/ INTRAOCULAR LENS  IMPLANT, BILATERAL    .  PERFORATED APPENDIX    . SMALL BOWEL OBSTRUCTION      Allergies  Allergen Reactions  . Pollen Extract     Outpatient Encounter Medications as of 02/27/2018  Medication Sig  . Calcium Carb-Ergocalciferol 500-200 MG-UNIT TABS Take 2 tablets by mouth daily.   . Cholecalciferol (VITAMIN D3) 2000 units capsule Take 2,000 Units by mouth every other day.  . denosumab (PROLIA) 60 MG/ML SOLN injection Inject 60 mg into the skin every 6 (six) months. Administer in upper arm, thigh, or abdomen  . NON FORMULARY Antronex dietary supplement 1 per day  . NON FORMULARY Seneplex take 1 tablet once daily  . Omega-3 Fatty Acids (FISH OIL) 1000 MG CAPS Take by mouth daily.  . vitamin B-12 (CYANOCOBALAMIN) 1000 MCG tablet Take 1,000 mcg by mouth daily.  . [DISCONTINUED] NON FORMULARY Celebrex once a day   No facility-administered encounter medications on file as of 02/27/2018.     Review of Systems:  Review of Systems  Constitutional: Negative  for chills and fever.  HENT: Negative for congestion and hearing loss.   Eyes: Negative for blurred vision.       Glasses  Respiratory: Negative for cough and shortness of breath.   Cardiovascular: Positive for chest pain. Negative for palpitations, orthopnea, claudication, leg swelling and PND.  Gastrointestinal: Negative for abdominal pain, blood in stool, constipation, diarrhea and melena.  Genitourinary: Negative for dysuria.  Musculoskeletal: Negative for falls and joint pain.  Neurological: Positive for headaches.       Left sided headache and left arm pain and small pain in chest with anxiety during argument with husband  Endo/Heme/Allergies: Does not bruise/bleed easily.  Psychiatric/Behavioral: Negative for depression and memory loss. The patient is nervous/anxious. The patient does not have insomnia.     Health Maintenance  Topic Date Due  . COLONOSCOPY  09/23/2017  . INFLUENZA VACCINE  01/17/2018  . MAMMOGRAM  10/20/2018  . TETANUS/TDAP  09/17/2021  . DEXA SCAN  Completed  . PNA vac Low Risk Adult  Completed    Physical Exam: Vitals:   02/27/18 1436  BP: 120/70  Pulse: 71  Temp: 98.7 F (37.1 C)  TempSrc: Oral  SpO2: 97%  Weight: 134 lb (60.8 kg)  Height: 5\' 1"  (1.549 m)   Body mass index is 25.32 kg/m. Physical Exam  Constitutional: She is oriented to person, place, and time. She appears well-developed and well-nourished. No distress.  HENT:  Head: Normocephalic and atraumatic.  Eyes: Pupils are equal, round, and reactive to light. EOM are normal.  glasses  Cardiovascular: Normal rate, regular rhythm, normal heart sounds and intact distal pulses.  Pulmonary/Chest: Effort normal and breath sounds normal. No respiratory distress.  Abdominal: Soft. Bowel sounds are normal. She exhibits no distension. There is no tenderness.  Musculoskeletal: Normal range of motion. She exhibits no tenderness.  Neurological: She is alert and oriented to person, place, and  time. No cranial nerve deficit.  Skin: Skin is warm and dry. Capillary refill takes less than 2 seconds.  Psychiatric: She has a normal mood and affect.    Labs reviewed: Basic Metabolic Panel: Recent Labs    02/12/18 0600  NA 142  K 4.2  BUN 12  CREATININE 0.7   Liver Function Tests: Recent Labs    02/12/18 0600  AST 28  ALT 25  ALKPHOS 71   No results for input(s): LIPASE, AMYLASE in the last 8760 hours. No results for input(s): AMMONIA in the last 8760 hours. CBC: Recent  Labs    02/12/18 0600  WBC 3.8  HGB 12.9  HCT 39  PLT 152   Lipid Panel: Recent Labs    02/12/18 0600  CHOL 233*  HDL 76*  LDLCALC 141  TRIG 83   Lab Results  Component Value Date   HGBA1C 5.4 10/29/2014    Procedures since last visit: Cscope at [facility] 6/19:  Was negative for polyps and she no longer needs these at her age.  Assessment/Plan 1. Stress due to family tension -see hpi -pt developed some chest pain and headache and arm pain at the time that resolved when event over  2. Senile osteoporosis -cont prolia, ca with D and additional D3 -cont water aerobics/swimming that she just started  3. Acute non intractable tension-type headache -was a single episode during stress  4. Anxiety -related to stress in family and with her husband, improved now  55. History of colonic polyps -did not have polyps on this year's cscope and no longer needs routine cscopes due to age per physician in [place]  6. Essential hypertension, benign -bp well controlled without medications   7. Pure hypercholesterolemia -LDL is high, has good HDL -discussed her diet significantly today and seems unlikely that changes in it will affect her labs -increasing activity should help--she's just begun water aerobics and wants to make it a habit  Labs/tests ordered: FLP before Next appt:  6 mos with fasting lipids before  Jora Galluzzo L. Brysin Towery, D.O. [facility] [address], [place] [address] Cell Phone (Mon-Fri 8am-5pm):  [phone] On Call:  [phone] & follow prompts after 5pm & weekends Office Phone:  [phone] Office Fax:  [phone]

## 2018-04-12 DIAGNOSIS — Z23 Encounter for immunization: Secondary | ICD-10-CM | POA: Diagnosis not present

## 2018-05-21 ENCOUNTER — Ambulatory Visit: Payer: Medicare Other | Admitting: *Deleted

## 2018-05-21 DIAGNOSIS — M810 Age-related osteoporosis without current pathological fracture: Secondary | ICD-10-CM | POA: Diagnosis not present

## 2018-05-21 MED ORDER — DENOSUMAB 60 MG/ML ~~LOC~~ SOSY
60.0000 mg | PREFILLED_SYRINGE | Freq: Once | SUBCUTANEOUS | Status: AC
  Administered 2018-05-21: 60 mg via SUBCUTANEOUS

## 2018-08-20 DIAGNOSIS — E785 Hyperlipidemia, unspecified: Secondary | ICD-10-CM | POA: Diagnosis not present

## 2018-08-20 LAB — LIPID PANEL
Cholesterol: 258 — AB (ref 0–200)
HDL: 91 — AB (ref 35–70)
LDL Cholesterol: 147
Triglycerides: 104 (ref 40–160)

## 2018-08-22 ENCOUNTER — Encounter: Payer: Self-pay | Admitting: Internal Medicine

## 2018-08-28 ENCOUNTER — Non-Acute Institutional Stay: Payer: Medicare Other | Admitting: Internal Medicine

## 2018-08-28 ENCOUNTER — Encounter: Payer: Self-pay | Admitting: Internal Medicine

## 2018-08-28 VITALS — BP 132/80 | HR 78 | Temp 98.5°F | Ht 61.0 in | Wt 138.0 lb

## 2018-08-28 DIAGNOSIS — I10 Essential (primary) hypertension: Secondary | ICD-10-CM | POA: Diagnosis not present

## 2018-08-28 DIAGNOSIS — E7800 Pure hypercholesterolemia, unspecified: Secondary | ICD-10-CM | POA: Diagnosis not present

## 2018-08-28 DIAGNOSIS — Z638 Other specified problems related to primary support group: Secondary | ICD-10-CM | POA: Diagnosis not present

## 2018-08-28 DIAGNOSIS — M810 Age-related osteoporosis without current pathological fracture: Secondary | ICD-10-CM

## 2018-08-28 DIAGNOSIS — Z86010 Personal history of colonic polyps: Secondary | ICD-10-CM

## 2018-08-28 NOTE — Progress Notes (Signed)
Location:  Occupational psychologist of Service:  Clinic (12)  Provider: Farhad Burleson L. Mariea Clonts, D.O., C.M.D.  Code Status: DNR Goals of Care:  Advanced Directives 02/26/2018  Does Patient Have a Medical Advance Directive? Yes  Type of Paramedic of [place];Out of facility DNR (pink MOST or yellow form)  Does patient want to make changes to medical advance directive? No - Patient declined  Copy of [place] in Chart? Yes  Pre-existing out of facility DNR order (yellow form or pink MOST form) Yellow form placed in chart (order not valid for inpatient use)     Chief Complaint  Patient presents with  . Medical Management of Chronic Issues    77mth follow-up    HPI: Patient is a 78 y.o. female seen today for medical management of chronic diseases.    Mostly eats fruit, veggies and meat.  Does eat 2 legs of fried chicken once a week.  Does eat a piece of chocolate daily.  Doesn't really eat cheese.  Once every 2 wks, small serving of ice cream.  Still swimming, but had things interfere the past two weeks.  Loves to.  Did have pool closed.  They also were walking.    Osteoporosis:  Tolerating prolia and vitamin D every other day.    Does put a lot of energy into her husband--she attends his suffering.  He did great while working on the [employer].  He's going to do a show of his photography.    Past Medical History:  Diagnosis Date  . Allergy   . Ankle fracture, left   . Bigeminy   . Cataract   . Colon polyps   . Diverticulosis   . FH: colon cancer   . Menopausal and perimenopausal disorder   . Multiple allergies   . Osteoporosis 2008   PER DEXA SCAN  . PVC (premature ventricular contraction)     Past Surgical History:  Procedure Laterality Date  . APPENDECTOMY    . CATARACT EXTRACTION W/ INTRAOCULAR LENS  IMPLANT, BILATERAL    . PERFORATED APPENDIX    . SMALL BOWEL OBSTRUCTION      Allergies  Allergen  Reactions  . Pollen Extract     Outpatient Encounter Medications as of 08/28/2018  Medication Sig  . Calcium Carb-Ergocalciferol 500-200 MG-UNIT TABS Take 2 tablets by mouth daily.   . Cholecalciferol (VITAMIN D3) 2000 units capsule Take 2,000 Units by mouth every other day.  . denosumab (PROLIA) 60 MG/ML SOLN injection Inject 60 mg into the skin every 6 (six) months. Administer in upper arm, thigh, or abdomen  . NON FORMULARY Antronex dietary supplement 1 per day  . NON FORMULARY Seneplex take 1 tablet once daily  . vitamin B-12 (CYANOCOBALAMIN) 1000 MCG tablet Take 1,000 mcg by mouth daily.  . [DISCONTINUED] Omega-3 Fatty Acids (FISH OIL) 1000 MG CAPS Take by mouth daily.   No facility-administered encounter medications on file as of 08/28/2018.     Review of Systems:  Review of Systems  Constitutional: Negative for chills, fever and malaise/fatigue.  HENT: Negative for hearing loss.   Eyes: Negative for blurred vision.  Respiratory: Negative for cough and shortness of breath.   Cardiovascular: Negative for chest pain, palpitations and leg swelling.  Gastrointestinal: Negative for abdominal pain, blood in stool, constipation, diarrhea and melena.  Genitourinary: Negative for dysuria.  Musculoskeletal: Negative for falls, joint pain and myalgias.  Skin: Negative for itching and rash.  Neurological:  Negative for loss of consciousness and headaches.  Endo/Heme/Allergies: Does not bruise/bleed easily.  Psychiatric/Behavioral: Negative for depression and memory loss. The patient is nervous/anxious.     Health Maintenance  Topic Date Due  . MAMMOGRAM  10/20/2018  . COLONOSCOPY  12/03/2020  . TETANUS/TDAP  09/17/2021  . INFLUENZA VACCINE  Completed  . DEXA SCAN  Completed  . PNA vac Low Risk Adult  Completed    Physical Exam: Vitals:   08/28/18 0941  BP: 132/80  Pulse: 78  Temp: 98.5 F (36.9 C)  TempSrc: Oral  SpO2: 97%  Weight: 138 lb (62.6 kg)  Height: 5\' 1"  (1.549 m)    Body mass index is 26.07 kg/m. Physical Exam Vitals signs reviewed.  Constitutional:      Appearance: Normal appearance.  HENT:     Head: Normocephalic and atraumatic.  Eyes:     Comments: glasses  Cardiovascular:     Rate and Rhythm: Normal rate and regular rhythm.     Pulses: Normal pulses.     Heart sounds: Normal heart sounds.  Pulmonary:     Effort: Pulmonary effort is normal.     Breath sounds: Normal breath sounds.  Abdominal:     General: Bowel sounds are normal.  Musculoskeletal: Normal range of motion.  Neurological:     General: No focal deficit present.     Mental Status: She is alert and oriented to person, place, and time.  Psychiatric:        Mood and Affect: Mood normal.    Labs reviewed: Basic Metabolic Panel: Recent Labs    02/12/18  NA 142  K 4.2  BUN 12  CREATININE 0.7   Liver Function Tests: Recent Labs    02/12/18 0600  AST 28  ALT 25  ALKPHOS 71   No results for input(s): LIPASE, AMYLASE in the last 8760 hours. No results for input(s): AMMONIA in the last 8760 hours. CBC: Recent Labs    02/12/18  WBC 3.8  HGB 12.9  HCT 39  PLT 152   Lipid Panel: Recent Labs    02/12/18 08/20/18 0400  CHOL 233* 258*  HDL 76* 91*  LDLCALC 141 147  TRIG 83 104   Lab Results  Component Value Date   HGBA1C 5.4 10/29/2014    Procedures since last visit: No results found.  Assessment/Plan 1. Pure hypercholesterolemia -LDL is elevated but she is eating a healthy diet and exercising and really does not want to take medication for this -did have a 3 week period w/o the pool (closed for renovations)  2. Stress due to family tension -ongoing issue, was better when her husband was active doing work on a play  3. Senile osteoporosis -ongoing, cont prolia, vitamin D and walking  4. History of colonic polyps -last cscope 12/03/17 and next due in 5 years if she remains a candidate at that time due to previous adenomatous polyps  5.  Essential hypertension, benign -bp at goal with current regimen, no side effect concerns so cont and monitor  Labs/tests orde.red:  Fasting lipids in 6 mos Next appt:  11/27/2018  Kc Sedlak L. Layson Bertsch, D.O. [facility] [address], [place] [address] Cell Phone (Mon-Fri 8am-5pm):  [phone] On Call:  [phone] & follow prompts after 5pm & weekends Office Phone:  [phone] Office Fax:  [phone]

## 2018-10-13 ENCOUNTER — Encounter: Payer: Self-pay | Admitting: Internal Medicine

## 2018-11-19 DIAGNOSIS — H353131 Nonexudative age-related macular degeneration, bilateral, early dry stage: Secondary | ICD-10-CM | POA: Diagnosis not present

## 2018-11-19 DIAGNOSIS — H52203 Unspecified astigmatism, bilateral: Secondary | ICD-10-CM | POA: Diagnosis not present

## 2018-11-19 DIAGNOSIS — Z961 Presence of intraocular lens: Secondary | ICD-10-CM | POA: Diagnosis not present

## 2018-11-22 ENCOUNTER — Other Ambulatory Visit: Payer: Self-pay | Admitting: Internal Medicine

## 2018-11-22 DIAGNOSIS — Z1231 Encounter for screening mammogram for malignant neoplasm of breast: Secondary | ICD-10-CM

## 2018-11-27 ENCOUNTER — Ambulatory Visit: Payer: Medicare Other | Admitting: *Deleted

## 2018-11-27 ENCOUNTER — Encounter: Payer: Medicare Other | Admitting: Internal Medicine

## 2018-11-27 ENCOUNTER — Other Ambulatory Visit: Payer: Self-pay

## 2018-11-27 DIAGNOSIS — M810 Age-related osteoporosis without current pathological fracture: Secondary | ICD-10-CM

## 2018-11-27 MED ORDER — DENOSUMAB 60 MG/ML ~~LOC~~ SOSY
60.0000 mg | PREFILLED_SYRINGE | Freq: Once | SUBCUTANEOUS | Status: AC
  Administered 2018-11-27: 60 mg via SUBCUTANEOUS

## 2018-11-27 NOTE — Progress Notes (Signed)
A user error has taken place: encounter opened in error, closed for administrative reasons.

## 2019-01-08 ENCOUNTER — Non-Acute Institutional Stay: Payer: Medicare Other | Admitting: Internal Medicine

## 2019-01-08 ENCOUNTER — Encounter: Payer: Self-pay | Admitting: Internal Medicine

## 2019-01-08 ENCOUNTER — Other Ambulatory Visit: Payer: Self-pay

## 2019-01-08 VITALS — BP 130/80 | HR 66 | Temp 98.9°F | Ht 61.0 in | Wt 133.0 lb

## 2019-01-08 DIAGNOSIS — Z638 Other specified problems related to primary support group: Secondary | ICD-10-CM | POA: Diagnosis not present

## 2019-01-08 DIAGNOSIS — I10 Essential (primary) hypertension: Secondary | ICD-10-CM | POA: Diagnosis not present

## 2019-01-08 DIAGNOSIS — E7800 Pure hypercholesterolemia, unspecified: Secondary | ICD-10-CM | POA: Diagnosis not present

## 2019-01-08 DIAGNOSIS — J3089 Other allergic rhinitis: Secondary | ICD-10-CM | POA: Diagnosis not present

## 2019-01-08 DIAGNOSIS — M810 Age-related osteoporosis without current pathological fracture: Secondary | ICD-10-CM

## 2019-01-08 DIAGNOSIS — K660 Peritoneal adhesions (postprocedural) (postinfection): Secondary | ICD-10-CM

## 2019-01-08 NOTE — Progress Notes (Signed)
Location:  Occupational psychologist of Service:  Clinic (12)  Provider: Danyon Mcginness L. Mariea Clonts, D.O., C.M.D.  Code Status: DNR Goals of Care:  Advanced Directives 02/26/2018  Does Patient Have a Medical Advance Directive? Yes  Type of Paramedic of [place];Out of facility DNR (pink MOST or yellow form)  Does patient want to make changes to medical advance directive? No - Patient declined  Copy of [place] in Chart? Yes  Pre-existing out of facility DNR order (yellow form or pink MOST form) Yellow form placed in chart (order not valid for inpatient use)     Chief Complaint  Patient presents with  . Medical Management of Chronic Issues    5mth follow-up    HPI: Patient is a 78 y.o. female seen today for medical management of chronic diseases.    She thinks she is doing well.  She thinks her emotional maturity has improved.  They communicate honestly with each other.  She's begun riding to places with him to avoid him getting lost or upset if he makes a wrong turn.    Sinuses are an issues.  She always wears her mask.  Yesterday she was outside with her mask and at the top of her sinuses it's irritated.  It's not really a headache.  She has an awareness of it and has to overcome it to operate.  She went to the outside 4th of July festival.  She was sneezing, coughing, etc.  She had to go inside.  She will only go in the garden for 10 minutes to deadhead her daylilies.  They have a fantastic filtering system.    She has quit drinking wine and that seems to help.  She is drinking green tea with honey real cold. Once a week, she does a squirt of flonase when it's the worst.    She does not have pain.  She can rest well in bed unless she and Marden Noble have not gotten along right that day.    No bowel concerns or abdominal pain.    She asks if she is fat and should lose weight.  We discussed that her BMI is upper normal which is great.     No palpitations.    Doing ok with prolia and with calcium/D and D3.  She never misses her chairfit 1 balance with Corrie Mckusick thru Fri for an hour.    She has seen a chiropractor, Johny Blamer.  4 years ago, she hurt her right knee and could not walk and a friend recommended him.  Knee got great after treatment.  She goes like once a month.  She had not gone from March until just now.  They were not wearing masks there.  She was quite uncomfortable.  She has opted not to come now until the pandemic is over.    Past Medical History:  Diagnosis Date  . Allergy   . Ankle fracture, left   . Bigeminy   . Cataract   . Colon polyps   . Diverticulosis   . FH: colon cancer   . Menopausal and perimenopausal disorder   . Multiple allergies   . Osteoporosis 2008   PER DEXA SCAN  . PVC (premature ventricular contraction)     Past Surgical History:  Procedure Laterality Date  . APPENDECTOMY    . CATARACT EXTRACTION W/ INTRAOCULAR LENS  IMPLANT, BILATERAL    . PERFORATED APPENDIX    . SMALL BOWEL OBSTRUCTION  Allergies  Allergen Reactions  . Pollen Extract     Outpatient Encounter Medications as of 01/08/2019  Medication Sig  . Calcium Carb-Ergocalciferol 500-200 MG-UNIT TABS Take 2 tablets by mouth daily.   . Cholecalciferol (VITAMIN D3) 2000 units capsule Take 2,000 Units by mouth every other day.  . denosumab (PROLIA) 60 MG/ML SOLN injection Inject 60 mg into the skin every 6 (six) months. Administer in upper arm, thigh, or abdomen  . NON FORMULARY Antronex dietary supplement 1 per day  . NON FORMULARY Seneplex take 1 tablet once daily  . NON FORMULARY Bio-C-Plus, take tablet by mouth once daily  . vitamin B-12 (CYANOCOBALAMIN) 1000 MCG tablet Take 1,000 mcg by mouth daily.   No facility-administered encounter medications on file as of 01/08/2019.     Review of Systems:  Review of Systems  Constitutional: Negative for chills, fever and malaise/fatigue.  HENT: Positive for  congestion.        Sinus discomfort  Eyes: Negative for blurred vision.       Glasses  Respiratory: Negative for cough and shortness of breath.   Cardiovascular: Negative for chest pain, palpitations and leg swelling.  Gastrointestinal: Negative for abdominal pain, blood in stool, constipation, diarrhea and melena.  Genitourinary: Negative for dysuria.  Musculoskeletal: Negative for falls and joint pain.  Skin: Negative for itching and rash.  Neurological: Negative for dizziness and loss of consciousness.  Endo/Heme/Allergies: Does not bruise/bleed easily.  Psychiatric/Behavioral: Negative for depression and memory loss. The patient is not nervous/anxious and does not have insomnia.     Health Maintenance  Topic Date Due  . MAMMOGRAM  10/20/2018  . INFLUENZA VACCINE  01/18/2019  . COLONOSCOPY  12/03/2020  . TETANUS/TDAP  09/17/2021  . DEXA SCAN  Completed  . PNA vac Low Risk Adult  Completed    Physical Exam: Vitals:   01/08/19 0844  BP: 130/80  Pulse: 66  Temp: 98.9 F (37.2 C)  TempSrc: Oral  SpO2: 96%  Weight: 133 lb (60.3 kg)  Height: 5\' 1"  (1.549 m)   Body mass index is 25.13 kg/m. Physical Exam Vitals signs reviewed.  Constitutional:      General: She is not in acute distress.    Appearance: Normal appearance. She is normal weight. She is not toxic-appearing.  HENT:     Head: Normocephalic and atraumatic.  Eyes:     Comments: glasses  Cardiovascular:     Rate and Rhythm: Normal rate and regular rhythm.     Pulses: Normal pulses.     Heart sounds: Normal heart sounds. No murmur.  Pulmonary:     Effort: Pulmonary effort is normal.     Breath sounds: Normal breath sounds. No wheezing, rhonchi or rales.  Musculoskeletal: Normal range of motion.  Skin:    General: Skin is warm and dry.     Capillary Refill: Capillary refill takes less than 2 seconds.  Neurological:     General: No focal deficit present.     Mental Status: She is alert and oriented to  person, place, and time.     Motor: No weakness.     Gait: Gait normal.  Psychiatric:        Mood and Affect: Mood normal.        Behavior: Behavior normal.        Thought Content: Thought content normal.        Judgment: Judgment normal.     Labs reviewed: Basic Metabolic Panel: Recent Labs    02/12/18  NA 142  K 4.2  BUN 12  CREATININE 0.7   Liver Function Tests: Recent Labs    02/12/18 0600  AST 28  ALT 25  ALKPHOS 71   No results for input(s): LIPASE, AMYLASE in the last 8760 hours. No results for input(s): AMMONIA in the last 8760 hours. CBC: Recent Labs    02/12/18  WBC 3.8  HGB 12.9  HCT 39  PLT 152   Lipid Panel: Recent Labs    02/12/18 08/20/18  CHOL 233* 258*  HDL 76* 91*  LDLCALC 141 147  TRIG 83 104   Lab Results  Component Value Date   HGBA1C 5.4 10/29/2014    Procedures since last visit: No results found.  Assessment/Plan 1. Senile osteoporosis -continues to do her balance class 5 days a week for an hour, walks some for meals, prolia, ca with D and additional D3  2. Essential hypertension, benign -well-controlled, no changes needed  3. Intestinal adhesions -with prior colon polyps, s/p pediatric cscope due to her bowel anatomy, should not need further cscopes  4. Pure hypercholesterolemia -LDL was higher than we'd like--goal less than 100  5. Non-seasonal allergic rhinitis, unspecified trigger -cont flonase on bad days, home filtration system, avoiding antihistamines due to potential side effects and limited symptoms  6. Stress due to family tension -discussed her interpersonal relationship as usual--she and Marden Noble are doing well lately  Labs/tests ordered:  Cbc with diff, cmp, flp before (9/8 at 8am)  Next appt:  03/05/2019  Atlantis Delong L. Ayush Boulet, D.O. [facility] [address], [place] [address] Cell Phone (Mon-Fri 8am-5pm):  [phone] On Call:  [phone] & follow prompts  after 5pm & weekends Office Phone:  [phone] Office Fax:  [phone]

## 2019-01-23 ENCOUNTER — Telehealth: Payer: Self-pay | Admitting: *Deleted

## 2019-01-23 NOTE — Telephone Encounter (Signed)
Spoke with patient and advised results   

## 2019-01-23 NOTE — Telephone Encounter (Signed)
It is fine with me.  She can get her annual skin check with them.

## 2019-01-23 NOTE — Telephone Encounter (Signed)
Patient called and stated that [facility] is going to have a Dermatology Clinic there 02/04/2019 and patient is wanting to know if she should make an appointment because it has been a long time since she has seen a Dermatologist. Please Advise.

## 2019-02-04 DIAGNOSIS — D229 Melanocytic nevi, unspecified: Secondary | ICD-10-CM | POA: Diagnosis not present

## 2019-02-04 DIAGNOSIS — D1801 Hemangioma of skin and subcutaneous tissue: Secondary | ICD-10-CM | POA: Diagnosis not present

## 2019-02-04 DIAGNOSIS — L905 Scar conditions and fibrosis of skin: Secondary | ICD-10-CM | POA: Diagnosis not present

## 2019-02-04 DIAGNOSIS — L814 Other melanin hyperpigmentation: Secondary | ICD-10-CM | POA: Diagnosis not present

## 2019-02-11 ENCOUNTER — Ambulatory Visit: Payer: Medicare Other

## 2019-02-14 ENCOUNTER — Ambulatory Visit: Payer: Medicare Other

## 2019-02-25 DIAGNOSIS — E785 Hyperlipidemia, unspecified: Secondary | ICD-10-CM | POA: Diagnosis not present

## 2019-02-25 DIAGNOSIS — I10 Essential (primary) hypertension: Secondary | ICD-10-CM | POA: Diagnosis not present

## 2019-02-25 DIAGNOSIS — D649 Anemia, unspecified: Secondary | ICD-10-CM | POA: Diagnosis not present

## 2019-02-25 LAB — LIPID PANEL
Cholesterol: 245 — AB (ref 0–200)
HDL: 78 — AB (ref 35–70)
LDL Cholesterol: 149
Triglycerides: 89 (ref 40–160)

## 2019-02-25 LAB — HEPATIC FUNCTION PANEL
ALT: 17 (ref 7–35)
AST: 20 (ref 13–35)
Alkaline Phosphatase: 46 (ref 25–125)
Bilirubin, Total: 0.4

## 2019-02-25 LAB — BASIC METABOLIC PANEL
BUN: 13 (ref 4–21)
Creatinine: 0.6 (ref 0.5–1.1)
Glucose: 91
Potassium: 3.9 (ref 3.4–5.3)
Sodium: 142 (ref 137–147)

## 2019-02-25 LAB — CBC AND DIFFERENTIAL
HCT: 38 (ref 36–46)
Hemoglobin: 12.8 (ref 12.0–16.0)
Platelets: 163 (ref 150–399)
WBC: 3.7

## 2019-02-28 ENCOUNTER — Encounter: Payer: Self-pay | Admitting: Internal Medicine

## 2019-03-05 ENCOUNTER — Other Ambulatory Visit: Payer: Self-pay

## 2019-03-05 ENCOUNTER — Encounter: Payer: Self-pay | Admitting: Internal Medicine

## 2019-03-05 ENCOUNTER — Non-Acute Institutional Stay: Payer: Medicare Other | Admitting: Internal Medicine

## 2019-03-05 VITALS — BP 116/70 | HR 66 | Temp 98.7°F | Ht 61.0 in | Wt 133.0 lb

## 2019-03-05 DIAGNOSIS — E7800 Pure hypercholesterolemia, unspecified: Secondary | ICD-10-CM | POA: Diagnosis not present

## 2019-03-05 DIAGNOSIS — M810 Age-related osteoporosis without current pathological fracture: Secondary | ICD-10-CM

## 2019-03-05 DIAGNOSIS — J3089 Other allergic rhinitis: Secondary | ICD-10-CM

## 2019-03-05 DIAGNOSIS — Z638 Other specified problems related to primary support group: Secondary | ICD-10-CM

## 2019-03-05 DIAGNOSIS — I10 Essential (primary) hypertension: Secondary | ICD-10-CM | POA: Diagnosis not present

## 2019-03-05 NOTE — Progress Notes (Signed)
Location:  [facility] of Service:  Clinic (12)  Provider: Thorvald Orsino L. Mariea Clonts, D.O., C.M.D.  Code Status: DNR Goals of Care:  Advanced Directives 03/05/2019  Does Patient Have a Medical Advance Directive? Yes  Type of Paramedic of [place];Out of facility DNR (pink MOST or yellow form)  Does patient want to make changes to medical advance directive? No - Patient declined  Copy of [place] in Chart? Yes - validated most recent copy scanned in chart (See row information)  Pre-existing out of facility DNR order (yellow form or pink MOST form) Yellow form placed in chart (order not valid for inpatient use)     Chief Complaint  Patient presents with  . Medical Management of Chronic Issues    6 month follow-up     HPI: Patient is a 78 y.o. female seen today for medical management of chronic diseases.    Left ankle had swollen slightly--it was stiffer, but not really sore.  She does do the ankle turning exercising.   No other concerns.  Reviewed labs.  She avoids high fat foods.  She has maintained her LDL around 149.  HDL is 78 this time.  Does 6.5 hrs of Robin instructed exercise weekly.  She's active in the house and sweats when vacuums.  She also grooms her plants some each morning.  Takes her b12 supplement, also her ca with D and additional D3.  On prolia.  Calcium wnl.    She went to the dermatologist--no precancerous lesions.  She has not been going outside.  She gets this awful pressure I her sinuses the next day which discourages her.    Is avoiding wine except rarely wants to toast something.  Having green tea with honey instead.  She does not hurt for which she is grateful.   Past Medical History:  Diagnosis Date  . Allergy   . Ankle fracture, left   . Bigeminy   . Cataract   . Colon polyps   . Diverticulosis   . FH: colon cancer   . Menopausal and perimenopausal disorder   . Multiple  allergies   . Osteoporosis 2008   PER DEXA SCAN  . PVC (premature ventricular contraction)     Past Surgical History:  Procedure Laterality Date  . APPENDECTOMY    . CATARACT EXTRACTION W/ INTRAOCULAR LENS  IMPLANT, BILATERAL    . PERFORATED APPENDIX    . SMALL BOWEL OBSTRUCTION      Allergies  Allergen Reactions  . Pollen Extract     Outpatient Encounter Medications as of 03/05/2019  Medication Sig  . Calcium Carb-Ergocalciferol 500-200 MG-UNIT TABS Take 2 tablets by mouth daily.   . Cholecalciferol (VITAMIN D3) 2000 units capsule Take 2,000 Units by mouth every other day.  . denosumab (PROLIA) 60 MG/ML SOLN injection Inject 60 mg into the skin every 6 (six) months. Administer in upper arm, thigh, or abdomen  . NON FORMULARY Antronex dietary supplement 1 per day  . NON FORMULARY Seneplex take 1 tablet once daily  . NON FORMULARY Bio-C-Plus, take tablet by mouth once daily  . vitamin B-12 (CYANOCOBALAMIN) 1000 MCG tablet Take 1,000 mcg by mouth daily.   No facility-administered encounter medications on file as of 03/05/2019.     Review of Systems:  Review of Systems  Constitutional: Negative for chills, fever and malaise/fatigue.  HENT: Positive for congestion and sinus pain.        If goes outside  Eyes: Negative for blurred vision and redness.  Respiratory: Negative for cough and shortness of breath.   Cardiovascular: Positive for palpitations and leg swelling.       Left ankle related to exercise, resolved now  Gastrointestinal: Negative for abdominal pain, blood in stool, constipation and melena.  Genitourinary: Negative for dysuria.  Musculoskeletal: Negative for falls and joint pain.  Skin: Negative for rash.  Neurological: Negative for dizziness and loss of consciousness.  Endo/Heme/Allergies: Positive for environmental allergies.  Psychiatric/Behavioral: Negative for depression and memory loss. The patient is not nervous/anxious and does not have insomnia.      Health Maintenance  Topic Date Due  . MAMMOGRAM  10/20/2018  . INFLUENZA VACCINE  01/18/2019  . COLONOSCOPY  12/03/2020  . TETANUS/TDAP  09/17/2021  . DEXA SCAN  Completed  . PNA vac Low Risk Adult  Completed    Physical Exam: Vitals:   03/05/19 0834  BP: 116/70  Pulse: 66  Temp: 98.7 F (37.1 C)  TempSrc: Oral  SpO2: 98%  Weight: 133 lb (60.3 kg)  Height: 5\' 1"  (1.549 m)   Body mass index is 25.13 kg/m. Physical Exam Vitals signs reviewed.  Constitutional:      General: She is not in acute distress.    Appearance: Normal appearance. She is normal weight. She is not toxic-appearing.  HENT:     Head: Normocephalic and atraumatic.     Right Ear: External ear normal.     Left Ear: External ear normal.  Eyes:     Extraocular Movements: Extraocular movements intact.     Pupils: Pupils are equal, round, and reactive to light.     Comments: glasses  Cardiovascular:     Rate and Rhythm: Normal rate and regular rhythm.     Comments: Some PVCs initially Pulmonary:     Effort: Pulmonary effort is normal.     Breath sounds: Normal breath sounds.  Abdominal:     General: Bowel sounds are normal.     Palpations: Abdomen is soft.  Musculoskeletal: Normal range of motion.        General: No tenderness.     Right lower leg: No edema.     Left lower leg: No edema.  Skin:    General: Skin is warm and dry.     Capillary Refill: Capillary refill takes less than 2 seconds.  Neurological:     General: No focal deficit present.     Mental Status: She is alert and oriented to person, place, and time.  Psychiatric:        Mood and Affect: Mood normal.        Behavior: Behavior normal.        Thought Content: Thought content normal.     Labs reviewed: Basic Metabolic Panel: Recent Labs    02/25/19 0500  NA 142  K 3.9  BUN 13  CREATININE 0.6   Liver Function Tests: Recent Labs    02/25/19 0500  AST 20  ALT 17  ALKPHOS 46   No results for input(s): LIPASE, AMYLASE  in the last 8760 hours. No results for input(s): AMMONIA in the last 8760 hours. CBC: Recent Labs    02/25/19 0500  WBC 3.7  HGB 12.8  HCT 38  PLT 163   Lipid Panel: Recent Labs    08/20/18 02/25/19 0500  CHOL 258* 245*  HDL 91* 78*  LDLCALC 147 149  TRIG 104 89   Lab Results  Component Value Date   HGBA1C  5.4 10/29/2014    Assessment/Plan 1. Senile osteoporosis -cont ca with D, additional D3, prolia and weightbearing exercise with Robin  2. Essential hypertension, benign -bp has been excellent without meds, continue exercise, low sodium diet   3. Non-seasonal allergic rhinitis, unspecified trigger -bothered by sinuses if goes outside for long periods; doing ok just spending a short time each am in her garden   4. Pure hypercholesterolemia -cont to work on exercise program--suggested adding back swimming or water aerobics 1-2 times per week on top of her regular aerobics with Shirlean Mylar to see if that will lower her LDL -cannot tolerate statin therapy  5. Stress due to family tension -improved since 40th anniversary, doing quite well and copes well with her husband's memory loss--helps him stay organized  Labs/tests ordered:  Cbc, bmp, lipid Next appt:  06/04/2019  Keyonia Gluth L. Maxon Kresse, D.O. [facility] [address], [place] [address] Cell Phone (Mon-Fri 8am-5pm):  [phone] On Call:  [phone] & follow prompts after 5pm & weekends Office Phone:  [phone] Office Fax:  [phone]

## 2019-04-17 ENCOUNTER — Encounter: Payer: Self-pay | Admitting: Family

## 2019-04-17 ENCOUNTER — Ambulatory Visit (INDEPENDENT_AMBULATORY_CARE_PROVIDER_SITE_OTHER): Payer: Medicare Other | Admitting: Family

## 2019-04-17 ENCOUNTER — Other Ambulatory Visit: Payer: Self-pay

## 2019-04-17 DIAGNOSIS — Z0000 Encounter for general adult medical examination without abnormal findings: Secondary | ICD-10-CM | POA: Diagnosis not present

## 2019-04-17 NOTE — Progress Notes (Signed)
Subjective:   Traci Woodard is a 78 y.o. female who presents for Medicare Annual (Subsequent) preventive examination.  Review of Systems:   Cardiac Risk Factors include: advanced age (>41men, >33 women)     Objective:     Vitals: There were no vitals taken for this visit.  There is no height or weight on file to calculate BMI.  Advanced Directives 03/05/2019 02/26/2018 08/15/2017 02/14/2017 01/17/2017 09/20/2016 08/16/2016  Does Patient Have a Medical Advance Directive? Yes Yes Yes Yes Yes Yes Yes  Type of Paramedic of [place];Out of facility DNR (pink MOST or yellow form) [place];Out of facility DNR (pink MOST or yellow form) [place];Out of facility DNR (pink MOST or yellow form) Healthcare Power of [place];Living will Healthcare Power of [place]  Does patient want to make changes to medical advance directive? No - Patient declined No - Patient declined No - Patient declined - No - Patient declined - -  Copy of [place] in Chart? Yes - validated most recent copy scanned in chart (See row information) Yes Yes Yes Yes Yes Yes  Pre-existing out of facility DNR order (yellow form or pink MOST form) Yellow form placed in chart (order not valid for inpatient use) Yellow form placed in chart (order not valid for inpatient use) Yellow form placed in chart (order not valid for inpatient use) - - - -    Tobacco Social History   Tobacco Use  Smoking Status Never Smoker  Smokeless Tobacco Never Used     Counseling given: Not Answered   Clinical Intake:  Pre-visit preparation completed: No  Pain : No/denies pain     BMI - recorded: 25.13 Nutritional Status: BMI 25 -29 Overweight Nutritional Risks: None Diabetes: No  How often do you need to have someone help you when you read instructions, pamphlets, or other written materials from your  doctor or pharmacy?: 1 - Never What is the last grade level you completed in school?: College  Interpreter Needed?: No  Information entered by :: Horris Speros FNP-C  Past Medical History:  Diagnosis Date  . Allergy   . Ankle fracture, left   . Bigeminy   . Cataract   . Colon polyps   . Diverticulosis   . FH: colon cancer   . Menopausal and perimenopausal disorder   . Multiple allergies   . Osteoporosis 2008   PER DEXA SCAN  . PVC (premature ventricular contraction)    Past Surgical History:  Procedure Laterality Date  . APPENDECTOMY    . CATARACT EXTRACTION W/ INTRAOCULAR LENS  IMPLANT, BILATERAL    . PERFORATED APPENDIX    . SMALL BOWEL OBSTRUCTION     Family History  Problem Relation Age of Onset  . Colon cancer Mother   . CAD Father   . Dementia Father   . Heart disease Father   . Colon cancer Brother   . Breast cancer Paternal Aunt    Social History   Socioeconomic History  . Marital status: Married    Spouse name: Not on file  . Number of children: Not on file  . Years of education: Not on file  . Highest education level: Not on file  Occupational History  . Occupation: Retired    Comment: Barrister's clerk Needs  . Financial resource strain: Not hard at all  . Food insecurity    Worry: Never true  Inability: Never true  . Transportation needs    Medical: No    Non-medical: No  Tobacco Use  . Smoking status: Never Smoker  . Smokeless tobacco: Never Used  Substance and Sexual Activity  . Alcohol use: Not Currently  . Drug use: No  . Sexual activity: Not on file  Lifestyle  . Physical activity    Days per week: 7 days    Minutes per session: 20 min  . Stress: Only a little  Relationships  . Social connections    Talks on phone: More than three times a week    Gets together: More than three times a week    Attends religious service: More than 4 times per year    Active member of club or organization: No    Attends meetings of clubs or  organizations: Never    Relationship status: Married  Other Topics Concern  . Not on file  Social History Narrative   Tobacco use, amount per day now:0   Past tobacco use, amount per day:0   How many years did you use tobacco:0   Alcohol use (drinks per week):7-14   Diet:FRUITS, VEGETABLES, YOGURT, LEAN MEAT AND FISH, FEW BREADS AND DESSERTS    Do you drink/eat things with caffeine:YES, COFFEE, CHOCOLATE   Marital status: MARRIED                               What year were you married? 1980   Do you live in a house, apartment, assisted living, condo, trailer, etc.? [place]   Is it one or more stories? ONE   How many persons live in your home? 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   YES                               Type and how often? WALK AND STRETCH DAILY   Do you have a living will? YES   Do you have a DNR form?  I THINK SO              If not, do you want to discuss one?   Do you have signed POA/HPOA forms?  YES                      If so, please bring to you appointment    Outpatient Encounter Medications as of 04/17/2019  Medication Sig  . Calcium Carb-Ergocalciferol 500-200 MG-UNIT TABS Take 2 tablets by mouth daily.   . Cholecalciferol (VITAMIN D3) 2000 units capsule Take 2,000 Units by mouth every other day.  . denosumab (PROLIA) 60 MG/ML SOLN injection Inject 60 mg into the skin every 6 (six) months. Administer in upper arm, thigh, or abdomen  . NON FORMULARY Antronex dietary supplement 1 per day  . NON FORMULARY Seneplex take 1 tablet once daily  . NON FORMULARY Bio-C-Plus, take tablet by mouth once daily  . vitamin B-12 (CYANOCOBALAMIN) 1000 MCG tablet Take 1,000 mcg by mouth daily.   No facility-administered encounter medications on file as of 04/17/2019.      Activities of Daily Living In your present state of  health, do you have any difficulty performing the following activities: 04/17/2019  Hearing? N  Vision? N  Difficulty concentrating or making decisions? N  Walking or climbing stairs? N  Dressing or bathing? N  Doing errands, shopping? N  Preparing Food and eating ? N  Using the Toilet? N  In the past six months, have you accidently leaked urine? N  Do you have problems with loss of bowel control? N  Managing your Medications? N  Managing your Finances? N  Housekeeping or managing your Housekeeping? Y  Comment facility assist x 1 a week  Some recent data might be hidden    Patient Care Team: Gayland Curry, DO as PCP - General (Geriatric Medicine) Druscilla Brownie, MD as Consulting Physician (Dermatology) Garlan Fair, MD as Consulting Physician (Gastroenterology) Mosetta Anis, MD as Referring Physician (Allergy) Sueanne Margarita, MD as Consulting Physician (Cardiology)    Assessment:   This is a routine wellness examination for Dyllan.  Exercise Activities and Dietary recommendations Current Exercise Habits: Structured exercise class, Type of exercise: Other - see comments;walking(aerobics), Time (Minutes): 45, Frequency (Times/Week): 5, Weekly Exercise (Minutes/Week): 225, Intensity: Moderate, Exercise limited by: Other - see comments(allergies to pollen in air)  Goals    . Exercise 5x per week (30 min per time)     Patient will start back up walking and doing classes.    . Patient Stated (pt-stated)     I will start going to water aerobics 5 days a week       Fall Risk Fall Risk  04/17/2019 03/05/2019 01/08/2019 08/28/2018 02/26/2018  Falls in the past year? 0 0 0 0 No  Number falls in past yr: 0 0 0 0 -  Injury with Fall? 0 0 0 0 -   Is the patient's home free of loose throw rugs in walkways, pet beds, electrical cords, etc?   no      Grab bars in the bathroom? yes      Handrails on the stairs?   no stairs        Adequate lighting?   yes  Depression Screen PHQ 2/9 Scores 04/17/2019 01/08/2019 08/28/2018 02/26/2018  PHQ - 2 Score 0 0 0 0     Cognitive Function MMSE - Mini Mental State Exam 02/26/2018 01/17/2017 02/16/2016  Orientation to time 5 5 5   Orientation to Place 5 5 5   Registration 3 3 3   Attention/ Calculation 5 5 5   Recall 3 2 3   Language- name 2 objects 2 2 2   Language- repeat 1 1 1   Language- follow 3 step command 3 3 3   Language- read & follow direction 1 1 1   Write a sentence 1 1 1   Copy design 1 1 1   Total score 30 29 30      6CIT Screen 04/17/2019  What Year? 0 points  What month? 0 points  What time? 0 points  Count back from 20 0 points  Months in reverse 0 points  Repeat phrase 0 points  Total Score 0    Immunization History  Administered Date(s) Administered  . Influenza, High Dose Seasonal PF 01/18/2015, 03/28/2019  . Influenza,inj,Quad PF,6+ Mos 04/24/2016, 04/12/2018  . Influenza-Unspecified 04/09/2017  . Pneumococcal Conjugate-13 09/22/2013  . Pneumococcal Polysaccharide-23 09/02/2004, 09/18/2011  . Tdap 09/18/2011  . Zoster 08/19/2008  . Zoster Recombinat (Shingrix) 08/21/2017, 11/12/2017     Qualifies for Shingles Vaccine? Up to date   Screening Tests Health Maintenance  Topic Date Due  . MAMMOGRAM  10/20/2018  . COLONOSCOPY  12/03/2020  . TETANUS/TDAP  09/17/2021  . INFLUENZA VACCINE  Completed  . DEXA SCAN  Completed  . PNA vac Low Risk Adult  Completed    Cancer Screenings: Lung: Low Dose CT Chest recommended if Age 80-80 years, 30 pack-year currently smoking OR have quit w/in 15years. Patient does not qualify. Breast:  Up to date on Mammogram? No  Post pone due COVID-19  Up to date of Bone Density/Dexa? Yes Colorectal:Up to date   Additional Screenings: Hepatitis C Screening: Low Risk   Plan:   - Mammogram post poned due to COVID-19   I have personally reviewed and noted the following in the patient's chart:   . Medical and  social history . Use of alcohol, tobacco or illicit drugs  . Current medications and supplements . Functional ability and status . Nutritional status . Physical activity . Advanced directives . List of other physicians . Hospitalizations, surgeries, and ER visits in previous 12 months . Vitals . Screenings to include cognitive, depression, and falls . Referrals and appointments  In addition, I have reviewed and discussed with patient certain preventive protocols, quality metrics, and best practice recommendations. A written personalized care plan for preventive services as well as general preventive health recommendations were provided to patient.    Sandrea Hughs, NP  04/17/2019

## 2019-04-17 NOTE — Patient Instructions (Signed)
Traci Woodard , Thank you for taking time to come for your Medicare Wellness Visit. I appreciate your ongoing commitment to your health goals. Please review the following plan we discussed and let me know if I can assist you in the future.   Screening recommendations/referrals: Colonoscopy: Up to date  Mammogram: Post pned until after COVID-64 restrictions  Bone Density : Up to date Recommended yearly ophthalmology/optometry visit for glaucoma screening and checkup Recommended yearly dental visit for hygiene and checkup  Vaccinations: Influenza vaccine : Up to date Pneumococcal vaccine: Up to date Tdap vaccine : Up to date Shingles vaccine : Up to date   Advanced directives: Yes   Conditions/risks identified: Advance Age female > 30 yrs   Next appointment: 1 year    Preventive Care 25 Years and Older, Female Preventive care refers to lifestyle choices and visits with your health care provider that can promote health and wellness. What does preventive care include?  A yearly physical exam. This is also called an annual well check.  Dental exams once or twice a year.  Routine eye exams. Ask your health care provider how often you should have your eyes checked.  Personal lifestyle choices, including:  Daily care of your teeth and gums.  Regular physical activity.  Eating a healthy diet.  Avoiding tobacco and drug use.  Limiting alcohol use.  Practicing safe sex.  Taking low-dose aspirin every day.  Taking vitamin and mineral supplements as recommended by your health care provider. What happens during an annual well check? The services and screenings done by your health care provider during your annual well check will depend on your age, overall health, lifestyle risk factors, and family history of disease. Counseling  Your health care provider may ask you questions about your:  Alcohol use.  Tobacco use.  Drug use.  Emotional well-being.  Home and  relationship well-being.  Sexual activity.  Eating habits.  History of falls.  Memory and ability to understand (cognition).  Work and work Statistician.  Reproductive health. Screening  You may have the following tests or measurements:  Height, weight, and BMI.  Blood pressure.  Lipid and cholesterol levels. These may be checked every 5 years, or more frequently if you are over 50 years old.  Skin check.  Lung cancer screening. You may have this screening every year starting at age 77 if you have a 30-pack-year history of smoking and currently smoke or have quit within the past 15 years.  Fecal occult blood test (FOBT) of the stool. You may have this test every year starting at age 46.  Flexible sigmoidoscopy or colonoscopy. You may have a sigmoidoscopy every 5 years or a colonoscopy every 10 years starting at age 41.  Hepatitis C blood test.  Hepatitis B blood test.  Sexually transmitted disease (STD) testing.  Diabetes screening. This is done by checking your blood sugar (glucose) after you have not eaten for a while (fasting). You may have this done every 1-3 years.  Bone density scan. This is done to screen for osteoporosis. You may have this done starting at age 14.  Mammogram. This may be done every 1-2 years. Talk to your health care provider about how often you should have regular mammograms. Talk with your health care provider about your test results, treatment options, and if necessary, the need for more tests. Vaccines  Your health care provider may recommend certain vaccines, such as:  Influenza vaccine. This is recommended every year.  Tetanus, diphtheria, and  acellular pertussis (Tdap, Td) vaccine. You may need a Td booster every 10 years.  Zoster vaccine. You may need this after age 55.  Pneumococcal 13-valent conjugate (PCV13) vaccine. One dose is recommended after age 62.  Pneumococcal polysaccharide (PPSV23) vaccine. One dose is recommended after  age 33. Talk to your health care provider about which screenings and vaccines you need and how often you need them. This information is not intended to replace advice given to you by your health care provider. Make sure you discuss any questions you have with your health care provider. Document Released: 07/02/2015 Document Revised: 02/23/2016 Document Reviewed: 04/06/2015 Elsevier Interactive Patient Education  2017 [place] Prevention in the Home Falls can cause injuries. They can happen to people of all ages. There are many things you can do to make your home safe and to help prevent falls. What can I do on the outside of my home?  Regularly fix the edges of walkways and driveways and fix any cracks.  Remove anything that might make you trip as you walk through a door, such as a raised step or threshold.  Trim any bushes or trees on the path to your home.  Use bright outdoor lighting.  Clear any walking paths of anything that might make someone trip, such as rocks or tools.  Regularly check to see if handrails are loose or broken. Make sure that both sides of any steps have handrails.  Any raised decks and porches should have guardrails on the edges.  Have any leaves, snow, or ice cleared regularly.  Use sand or salt on walking paths during winter.  Clean up any spills in your garage right away. This includes oil or grease spills. What can I do in the bathroom?  Use night lights.  Install grab bars by the toilet and in the tub and shower. Do not use towel bars as grab bars.  Use non-skid mats or decals in the tub or shower.  If you need to sit down in the shower, use a plastic, non-slip stool.  Keep the floor dry. Clean up any water that spills on the floor as soon as it happens.  Remove soap buildup in the tub or shower regularly.  Attach bath mats securely with double-sided non-slip rug tape.  Do not have throw rugs and other things on the floor that can  make you trip. What can I do in the bedroom?  Use night lights.  Make sure that you have a light by your bed that is easy to reach.  Do not use any sheets or blankets that are too big for your bed. They should not hang down onto the floor.  Have a firm chair that has side arms. You can use this for support while you get dressed.  Do not have throw rugs and other things on the floor that can make you trip. What can I do in the kitchen?  Clean up any spills right away.  Avoid walking on wet floors.  Keep items that you use a lot in easy-to-reach places.  If you need to reach something above you, use a strong step stool that has a grab bar.  Keep electrical cords out of the way.  Do not use floor polish or wax that makes floors slippery. If you must use wax, use non-skid floor wax.  Do not have throw rugs and other things on the floor that can make you trip. What can I do with my stairs?  Do not leave any items on the stairs.  Make sure that there are handrails on both sides of the stairs and use them. Fix handrails that are broken or loose. Make sure that handrails are as long as the stairways.  Check any carpeting to make sure that it is firmly attached to the stairs. Fix any carpet that is loose or worn.  Avoid having throw rugs at the top or bottom of the stairs. If you do have throw rugs, attach them to the floor with carpet tape.  Make sure that you have a light switch at the top of the stairs and the bottom of the stairs. If you do not have them, ask someone to add them for you. What else can I do to help prevent falls?  Wear shoes that:  Do not have high heels.  Have rubber bottoms.  Are comfortable and fit you well.  Are closed at the toe. Do not wear sandals.  If you use a stepladder:  Make sure that it is fully opened. Do not climb a closed stepladder.  Make sure that both sides of the stepladder are locked into place.  Ask someone to hold it for you,  if possible.  Clearly mark and make sure that you can see:  Any grab bars or handrails.  First and last steps.  Where the edge of each step is.  Use tools that help you move around (mobility aids) if they are needed. These include:  Canes.  Walkers.  Scooters.  Crutches.  Turn on the lights when you go into a dark area. Replace any light bulbs as soon as they burn out.  Set up your furniture so you have a clear path. Avoid moving your furniture around.  If any of your floors are uneven, fix them.  If there are any pets around you, be aware of where they are.  Review your medicines with your doctor. Some medicines can make you feel dizzy. This can increase your chance of falling. Ask your doctor what other things that you can do to help prevent falls. This information is not intended to replace advice given to you by your health care provider. Make sure you discuss any questions you have with your health care provider. Document Released: 04/01/2009 Document Revised: 11/11/2015 Document Reviewed: 07/10/2014 Elsevier Interactive Patient Education  2017 [employer].

## 2019-04-17 NOTE — Progress Notes (Signed)
This service is provided via telemedicine  No vital signs collected/recorded due to the encounter was a telemedicine visit.   Location of patient (ex: home, work):  Home   Patient consents to a telephone visit:  Yes  Location of the provider (ex: office, home): Office   Name of any referring provider: Hollace Kinnier, [place] of all persons participating in the telemedicine service and their role in the encounter:  Marlowe Sax, NP, Ruthell Rummage CMA, and Elnora Morrison   Time spent on call: Ruthell Rummage CMA, spent 7 minutes on phone with patient.

## 2019-04-29 ENCOUNTER — Encounter: Payer: Medicare Other | Admitting: Nurse Practitioner

## 2019-06-04 ENCOUNTER — Ambulatory Visit: Payer: Medicare Other

## 2019-06-06 ENCOUNTER — Other Ambulatory Visit: Payer: Self-pay

## 2019-06-06 ENCOUNTER — Ambulatory Visit (INDEPENDENT_AMBULATORY_CARE_PROVIDER_SITE_OTHER): Payer: Medicare Other

## 2019-06-06 DIAGNOSIS — M810 Age-related osteoporosis without current pathological fracture: Secondary | ICD-10-CM | POA: Diagnosis not present

## 2019-06-06 MED ORDER — DENOSUMAB 60 MG/ML ~~LOC~~ SOSY
60.0000 mg | PREFILLED_SYRINGE | Freq: Once | SUBCUTANEOUS | Status: AC
  Administered 2019-06-06: 09:00:00 60 mg via SUBCUTANEOUS

## 2019-08-28 ENCOUNTER — Encounter: Payer: Self-pay | Admitting: Internal Medicine

## 2019-08-28 LAB — COMPREHENSIVE METABOLIC PANEL
GFR calc Af Amer: 90
GFR calc non Af Amer: 82.89

## 2019-08-28 LAB — CBC AND DIFFERENTIAL
HCT: 39 (ref 36–46)
Hemoglobin: 12.9 (ref 12.0–16.0)
Platelets: 139 — AB (ref 150–399)
WBC: 3.6

## 2019-08-28 LAB — LIPID PANEL
Cholesterol: 231 — AB (ref 0–200)
HDL: 76 — AB (ref 35–70)
LDL Cholesterol: 138
Triglycerides: 83 (ref 40–160)

## 2019-08-28 LAB — BASIC METABOLIC PANEL
BUN: 13 (ref 4–21)
CO2: 25 — AB (ref 13–22)
Chloride: 102 (ref 99–108)
Creatinine: 0.7 (ref 0.5–1.1)
Glucose: 94
Potassium: 4 (ref 3.4–5.3)
Sodium: 139 (ref 137–147)

## 2019-08-28 LAB — CBC: RBC: 4.29 (ref 3.87–5.11)

## 2019-09-03 ENCOUNTER — Other Ambulatory Visit: Payer: Self-pay

## 2019-09-03 ENCOUNTER — Non-Acute Institutional Stay: Payer: Medicare PPO | Admitting: Internal Medicine

## 2019-09-03 ENCOUNTER — Encounter: Payer: Self-pay | Admitting: Internal Medicine

## 2019-09-03 VITALS — BP 138/92 | HR 86 | Temp 98.8°F | Ht 61.0 in | Wt 135.0 lb

## 2019-09-03 DIAGNOSIS — J3089 Other allergic rhinitis: Secondary | ICD-10-CM

## 2019-09-03 DIAGNOSIS — M810 Age-related osteoporosis without current pathological fracture: Secondary | ICD-10-CM

## 2019-09-03 DIAGNOSIS — I10 Essential (primary) hypertension: Secondary | ICD-10-CM

## 2019-09-03 DIAGNOSIS — Z638 Other specified problems related to primary support group: Secondary | ICD-10-CM | POA: Diagnosis not present

## 2019-09-03 DIAGNOSIS — K660 Peritoneal adhesions (postprocedural) (postinfection): Secondary | ICD-10-CM | POA: Diagnosis not present

## 2019-09-03 DIAGNOSIS — E7800 Pure hypercholesterolemia, unspecified: Secondary | ICD-10-CM

## 2019-09-03 NOTE — Progress Notes (Signed)
Location:   [place] of Service:  Clinic (12)  Provider: Shilee Biggs L. Mariea Clonts, D.O., C.M.D.  Code Status: DNR Goals of Care:  Advanced Directives 09/03/2019  Does Patient Have a Medical Advance Directive? Yes  Type of Advance Directive Out of facility DNR (pink MOST or yellow form)  Does patient want to make changes to medical advance directive? No - Patient declined  Copy of [place] in Chart? -  Pre-existing out of facility DNR order (yellow form or pink MOST form) Yellow form placed in chart (order not valid for inpatient use)     Chief Complaint  Patient presents with  . Medical Management of Chronic Issues    6 month follow up with lab results     HPI: Patient is a 79 y.o. female seen today for medical management of chronic diseases.    She spends a lot of her time keeping their life organized due to Doug's memory loss.  She says it's the organizing center.  She is grateful for many things.    Breathing is always a concern.  She got some new flonase, but has not needed yet.  Wants to keep ahead of having an episode of breathing particles.  Furnaces were not serviced amid covid and she realized the air was nasty and maintenance changed the filter and it was better.  Has some little sneezing fits.  Vacuums a lot.  Has not had sinus infections.  She does not want to make swimming appts so far ahead.  She is doing Robin's videos.  She misses being outside.    No abdominal pain.    Rare palpitations.  Doug's family and the dynamic are the triggers.  She feels better and stronger in this realm.  Her bp was slightly high.  Marden Noble was being sweet about her bday and she had to hurry to leave for her appt.    Appears she's had 6 prolia injections so far (3 years).  Wound up canceling her 2020 mammogram due to covid.    Past Medical History:  Diagnosis Date  . Allergy   . Ankle fracture, left   . Bigeminy   . Cataract   . Colon polyps   .  Diverticulosis   . FH: colon cancer   . Menopausal and perimenopausal disorder   . Multiple allergies   . Osteoporosis 2008   PER DEXA SCAN  . PVC (premature ventricular contraction)     Past Surgical History:  Procedure Laterality Date  . APPENDECTOMY    . CATARACT EXTRACTION W/ INTRAOCULAR LENS  IMPLANT, BILATERAL    . PERFORATED APPENDIX    . SMALL BOWEL OBSTRUCTION      Allergies  Allergen Reactions  . Pollen Extract     Outpatient Encounter Medications as of 09/03/2019  Medication Sig  . Calcium Carb-Ergocalciferol 500-200 MG-UNIT TABS Take 2 tablets by mouth daily.   . Cholecalciferol (VITAMIN D3) 2000 units capsule Take 2,000 Units by mouth every other day.  . denosumab (PROLIA) 60 MG/ML SOLN injection Inject 60 mg into the skin every 6 (six) months. Administer in upper arm, thigh, or abdomen  . NON FORMULARY Antronex dietary supplement 1 per day  . NON FORMULARY Seneplex take 1 tablet once daily  . NON FORMULARY Bio-C-Plus, take tablet by mouth once daily  . vitamin B-12 (CYANOCOBALAMIN) 1000 MCG tablet Take 1,000 mcg by mouth daily.   No facility-administered encounter medications on file as of 09/03/2019.  Review of Systems:  Review of Systems  Constitutional: Negative for chills, fever and malaise/fatigue.  HENT: Negative for congestion and sore throat.   Eyes: Negative for blurred vision.  Respiratory: Negative for cough and shortness of breath.   Cardiovascular: Positive for palpitations. Negative for chest pain and leg swelling.       Not many  Gastrointestinal: Negative for abdominal pain, blood in stool, constipation, diarrhea and melena.  Genitourinary: Negative for dysuria.  Musculoskeletal: Negative for falls, joint pain and myalgias.  Skin: Negative for itching and rash.  Neurological: Negative for dizziness and loss of consciousness.  Psychiatric/Behavioral: Negative for depression and memory loss. The patient is nervous/anxious. The patient does  not have insomnia.     Health Maintenance  Topic Date Due  . MAMMOGRAM  10/20/2018  . COLONOSCOPY  12/03/2020  . TETANUS/TDAP  09/17/2021  . INFLUENZA VACCINE  Completed  . DEXA SCAN  Completed  . PNA vac Low Risk Adult  Completed    Physical Exam: Vitals:   09/03/19 0837  BP: (!) 138/92  Pulse: 86  Temp: 98.8 F (37.1 C)  TempSrc: Temporal  SpO2: 97%  Weight: 135 lb (61.2 kg)  Height: 5\' 1"  (1.549 m)   Body mass index is 25.51 kg/m. Physical Exam Vitals reviewed.  Constitutional:      Appearance: Normal appearance.  Cardiovascular:     Rate and Rhythm: Normal rate and regular rhythm.     Pulses: Normal pulses.     Heart sounds: Normal heart sounds.  Pulmonary:     Effort: Pulmonary effort is normal.     Breath sounds: Normal breath sounds.  Abdominal:     General: Bowel sounds are normal.  Musculoskeletal:        General: Normal range of motion.     Right lower leg: No edema.     Left lower leg: No edema.  Skin:    General: Skin is warm and dry.  Neurological:     General: No focal deficit present.     Mental Status: She is alert and oriented to person, place, and time.  Psychiatric:        Mood and Affect: Mood normal.        Behavior: Behavior normal.        Thought Content: Thought content normal.        Judgment: Judgment normal.     Labs reviewed: Basic Metabolic Panel: Recent Labs    02/25/19 0000  NA 142  K 3.9  BUN 13  CREATININE 0.6   Liver Function Tests: Recent Labs    02/25/19 0500  AST 20  ALT 17  ALKPHOS 46   No results for input(s): LIPASE, AMYLASE in the last 8760 hours. No results for input(s): AMMONIA in the last 8760 hours. CBC: Recent Labs    02/25/19 0000  WBC 3.7  HGB 12.8  HCT 38  PLT 163   Lipid Panel: Recent Labs    02/25/19 0000  CHOL 245*  HDL 78*  LDLCALC 149  TRIG 89   Lab Results  Component Value Date   HGBA1C 5.4 10/29/2014    Assessment/Plan 1. Senile osteoporosis -cont prolia q 6  mos (June, dec), f/u bone density with mammogram--pt to call to schedule  2. Non-seasonal allergic rhinitis, unspecified trigger -may use flonase, zyrtec if needed, had air filter changed with benefit and keeps the home very clean to prevent dust mites from accumulating  3. Stress due to family tension -and caregiver  stress due to her husband's early stage dementia--he is requiring assistance with medications now (reminders to take/checking behind) and for appts primarily, they are going to pick up their meals together now so he does not feel stressed trying to remember on his own  4. Intestinal adhesions -no recent difficulty with obstructions, cont fiber and adequate hydration, exercise program  5. Pure hypercholesterolemia -lipids remain elevated, but have trended down -continue with exercise program, did best with pool exercise but frustrated with the scheduling process at this point amid covid so doing other programs instead  6. Essential hypertension, benign -bp is well controlled w/o medications, just low sodium diet and exercise program  Labs/tests ordered:   Lab Orders     CBC and differential     CBC     Basic metabolic panel     Comprehensive metabolic panel     Lipid panel  Next appt:  12/08/2019  Arlin Savona L. Goodwin Kamphaus, D.O. [facility] [address], [place] [address] Cell Phone (Mon-Fri 8am-5pm):  [phone] On Call:  [phone] & follow prompts after 5pm & weekends Office Phone:  [phone] Office Fax:  [phone]

## 2019-09-18 ENCOUNTER — Other Ambulatory Visit: Payer: Self-pay | Admitting: Internal Medicine

## 2019-09-18 DIAGNOSIS — M810 Age-related osteoporosis without current pathological fracture: Secondary | ICD-10-CM

## 2019-10-02 ENCOUNTER — Ambulatory Visit: Payer: Medicare Other

## 2019-11-11 ENCOUNTER — Telehealth: Payer: Self-pay | Admitting: *Deleted

## 2019-11-11 NOTE — Telephone Encounter (Signed)
Appointment scheduled with Dr. Mariea Clonts at 10:30 11/12/19 Patient notified and agreed.

## 2019-11-11 NOTE — Telephone Encounter (Signed)
Patient called and stated that she has a swollen ankle. Stated that her husband has an appointment tomorrow with Dr. Mariea Clonts at 10:30 and she has to be with him due to his hearing. Patient wants to know if you can take a look at her swollen ankle at that time also.  (no available appointment at [facility] and she wants to be seen there).

## 2019-11-11 NOTE — Telephone Encounter (Signed)
Ok to double book for me to see her, too.

## 2019-11-12 ENCOUNTER — Ambulatory Visit: Payer: Medicare PPO | Admitting: Internal Medicine

## 2019-11-12 ENCOUNTER — Telehealth: Payer: Self-pay | Admitting: *Deleted

## 2019-11-12 ENCOUNTER — Encounter: Payer: Self-pay | Admitting: Internal Medicine

## 2019-11-12 ENCOUNTER — Other Ambulatory Visit: Payer: Self-pay

## 2019-11-12 ENCOUNTER — Ambulatory Visit (HOSPITAL_COMMUNITY)
Admission: RE | Admit: 2019-11-12 | Discharge: 2019-11-12 | Disposition: A | Discharge status: Home / Self Care | Payer: Medicare PPO | Source: Ambulatory Visit | Attending: Internal Medicine | Admitting: Internal Medicine

## 2019-11-12 VITALS — BP 138/82 | HR 61 | Temp 97.8°F | Ht 61.0 in | Wt 137.0 lb

## 2019-11-12 DIAGNOSIS — R2242 Localized swelling, mass and lump, left lower limb: Secondary | ICD-10-CM

## 2019-11-12 NOTE — Progress Notes (Signed)
Good news!  No blood clot!   I recommend that Traci Woodard elevate her feet at rest, avoid high sodium foods and adding salt to her food.  If these approaches are not successful and the swelling becomes more problematic, we may need to consider compression stockings.

## 2019-11-12 NOTE — Progress Notes (Signed)
Location:  [facility]   Place of Service:   clinic  Provider: Rakeen Gaillard L. Mariea Clonts, D.O., C.M.D.  Code Status: DNR Goals of Care:  Advanced Directives 11/12/2019  Does Patient Have a Medical Advance Directive? Yes  Type of Advance Directive Out of facility DNR (pink MOST or yellow form)  Does patient want to make changes to medical advance directive? No - Patient declined  Copy of [place] in Chart? -  Pre-existing out of facility DNR order (yellow form or pink MOST form) Pink MOST/Yellow Form most recent copy in chart - Physician notified to receive inpatient order   Chief Complaint  Patient presents with  . Acute Visit    Swollen ankles left     HPI: Patient is a 79 y.o. female seen today for an acute visit for left leg swelling.    In 2009, she broke the left ankle.  It's swollen for maybe 2 wks (admits she's not been aware of herself).  Not painful.  They look almost the same first thing in the morning.  Gravity works as the day goes on.  No new exercises.  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  Past Medical History:  Diagnosis Date  . Allergy   . Ankle fracture, left   . Bigeminy   . Cataract   . Colon polyps   . Diverticulosis   . FH: colon cancer   . Menopausal and perimenopausal disorder   . Multiple allergies   . Osteoporosis 2008   PER DEXA SCAN  . PVC (premature ventricular contraction)     Past Surgical History:  Procedure Laterality Date  . APPENDECTOMY    . CATARACT EXTRACTION W/ INTRAOCULAR LENS  IMPLANT, BILATERAL    . PERFORATED APPENDIX    . SMALL BOWEL OBSTRUCTION      Allergies  Allergen Reactions  . Pollen Extract     Outpatient Encounter Medications as of 11/12/2019  Medication Sig  . Calcium Carb-Ergocalciferol 500-200 MG-UNIT TABS Take 2 tablets by mouth daily.   . Cholecalciferol (VITAMIN D3) 2000 units capsule Take 2,000 Units by mouth every other day.  . denosumab (PROLIA) 60 MG/ML SOLN injection Inject 60 mg into  the skin every 6 (six) months. Administer in upper arm, thigh, or abdomen  . NON FORMULARY Antronex dietary supplement 1 per day  . NON FORMULARY Seneplex take 1 tablet once daily  . NON FORMULARY Bio-C-Plus, take tablet by mouth once daily  . vitamin B-12 (CYANOCOBALAMIN) 1000 MCG tablet Take 1,000 mcg by mouth daily.   No facility-administered encounter medications on file as of 11/12/2019.    Review of Systems:  Review of Systems  Constitutional: Negative for chills and fever.  Respiratory: Negative for shortness of breath.   Cardiovascular: Negative for chest pain.  Gastrointestinal: Negative for constipation.  Genitourinary: Negative for dysuria.  Musculoskeletal: Negative for falls.       No injury  Neurological: Negative for dizziness and loss of consciousness.  Endo/Heme/Allergies: Does not bruise/bleed easily.  Psychiatric/Behavioral: Negative for depression. The patient is nervous/anxious. The patient does not have insomnia.     Health Maintenance  Topic Date Due  . MAMMOGRAM  10/20/2018  . INFLUENZA VACCINE  01/18/2020  . COLONOSCOPY  12/03/2020  . TETANUS/TDAP  09/17/2021  . DEXA SCAN  Completed  . COVID-19 Vaccine  Completed  . PNA vac Low Risk Adult  Completed    Physical Exam: Vitals:   11/12/19 1047  BP: 138/82  Pulse: 61  Temp: 97.8  F (36.6 C)  TempSrc: Temporal  SpO2: 96%  Weight: 137 lb (62.1 kg)  Height: 5\' 1"  (1.549 m)   Body mass index is 25.89 kg/m. Physical Exam Vitals reviewed.  Constitutional:      General: She is not in acute distress.    Appearance: Normal appearance. She is not ill-appearing or toxic-appearing.  HENT:     Head: Normocephalic and atraumatic.  Cardiovascular:     Rate and Rhythm: Normal rate and regular rhythm.     Pulses: Normal pulses.     Heart sounds: Normal heart sounds.  Pulmonary:     Effort: Pulmonary effort is normal.     Breath sounds: Normal breath sounds. No wheezing, rhonchi or rales.  Abdominal:      General: Bowel sounds are normal.  Musculoskeletal:     Left lower leg: Edema present.     Comments: No edema on right at all, left increased diameter vs right and visible swelling in foot  Skin:    General: Skin is warm and dry.  Neurological:     General: No focal deficit present.     Mental Status: She is alert and oriented to person, place, and time.  Psychiatric:        Mood and Affect: Mood normal.        Behavior: Behavior normal.     Labs reviewed: Basic Metabolic Panel: Recent Labs    02/25/19 0000 08/28/19 0800  NA 142 139  K 3.9 4.0  CL  --  102  CO2  --  25*  BUN 13 13  CREATININE 0.6 0.7   Liver Function Tests: Recent Labs    02/25/19 0500  AST 20  ALT 17  ALKPHOS 46   No results for input(s): LIPASE, AMYLASE in the last 8760 hours. No results for input(s): AMMONIA in the last 8760 hours. CBC: Recent Labs    02/25/19 0000 08/28/19 0800  WBC 3.7 3.6  HGB 12.8 12.9  HCT 38 39  PLT 163 139*   Lipid Panel: Recent Labs    02/25/19 0000 08/28/19 0800  CHOL 245* 231*  HDL 78* 76*  LDLCALC 149 138  TRIG 89 83   Lab Results  Component Value Date   HGBA1C 5.4 10/29/2014    Procedures since last visit: No results found.  Assessment/Plan 1. Localized swelling of left lower leg - new onset about 2 weeks ago - will r/o DVT -may be venous insufficiency that is asymmetric due to prior ankle fx when young - VAS [place] LOWER EXTREMITY VENOUS (DVT); Future  Labs/tests ordered: venous doppler Next appt:  12/08/2019  Valecia Beske L. Sheryl Saintil, D.O. [facility] [address], [place] [address] Cell Phone (Mon-Fri 8am-5pm):  [phone] On Call:  [phone] & follow prompts after 5pm & weekends Office Phone:  [phone] Office Fax:  [phone]

## 2019-11-12 NOTE — Telephone Encounter (Signed)
Traci Woodard with [facility] called with a Preliminary Report: NEGATIVE for DVT or Superficial Thrombus   Sent patient home and will send Final once read.

## 2019-11-14 NOTE — Telephone Encounter (Signed)
Dr.Reed addressed report and patient was contacted by Bonney Leitz with results on 5/2&/2021

## 2019-11-24 ENCOUNTER — Telehealth: Payer: Self-pay

## 2019-11-24 NOTE — Telephone Encounter (Signed)
Patient called to make you aware that her Left ankle is still swollen, no pain. She has cut her salt intake down and has been elevating her ankle. She would like to know what other suggestions you may have for her. Routing to Dr. Mariea Clonts

## 2019-11-24 NOTE — Telephone Encounter (Signed)
Only other thing she might try is compression stockings, but swelling is mild so I'm not sure she wants to do that.

## 2019-11-25 NOTE — Telephone Encounter (Signed)
Discussed with patient. No further questions

## 2019-11-26 DIAGNOSIS — Z961 Presence of intraocular lens: Secondary | ICD-10-CM | POA: Diagnosis not present

## 2019-11-26 DIAGNOSIS — H353131 Nonexudative age-related macular degeneration, bilateral, early dry stage: Secondary | ICD-10-CM | POA: Diagnosis not present

## 2019-11-26 DIAGNOSIS — H52203 Unspecified astigmatism, bilateral: Secondary | ICD-10-CM | POA: Diagnosis not present

## 2019-11-27 ENCOUNTER — Other Ambulatory Visit: Payer: Self-pay

## 2019-11-27 ENCOUNTER — Ambulatory Visit
Admission: RE | Admit: 2019-11-27 | Discharge: 2019-11-27 | Disposition: A | Discharge status: Home / Self Care | Payer: Medicare PPO | Source: Ambulatory Visit | Attending: Internal Medicine | Admitting: Internal Medicine

## 2019-11-27 DIAGNOSIS — Z780 Asymptomatic menopausal state: Secondary | ICD-10-CM | POA: Diagnosis not present

## 2019-11-27 DIAGNOSIS — M810 Age-related osteoporosis without current pathological fracture: Secondary | ICD-10-CM

## 2019-11-27 DIAGNOSIS — Z1231 Encounter for screening mammogram for malignant neoplasm of breast: Secondary | ICD-10-CM | POA: Diagnosis not present

## 2019-11-28 NOTE — Progress Notes (Signed)
Good news.  Her regular exercise along with her prolia and vitamin D supplementation have been helping her bone density readings.  I recommend we continue prolia which has data for benefit out to 10 years now.  Keep up the good work!

## 2019-12-01 NOTE — Progress Notes (Signed)
Normal mammogram

## 2019-12-08 ENCOUNTER — Other Ambulatory Visit: Payer: Self-pay

## 2019-12-08 ENCOUNTER — Ambulatory Visit (INDEPENDENT_AMBULATORY_CARE_PROVIDER_SITE_OTHER): Payer: Medicare PPO | Admitting: *Deleted

## 2019-12-08 DIAGNOSIS — M810 Age-related osteoporosis without current pathological fracture: Secondary | ICD-10-CM | POA: Diagnosis not present

## 2019-12-08 MED ORDER — DENOSUMAB 60 MG/ML ~~LOC~~ SOSY
60.0000 mg | PREFILLED_SYRINGE | Freq: Once | SUBCUTANEOUS | Status: AC
Start: 2019-12-08 — End: 2019-12-08
  Administered 2019-12-08: 60 mg via SUBCUTANEOUS

## 2020-01-30 ENCOUNTER — Encounter: Payer: Self-pay | Admitting: Nurse Practitioner

## 2020-01-30 ENCOUNTER — Other Ambulatory Visit: Payer: Self-pay

## 2020-01-30 ENCOUNTER — Ambulatory Visit (INDEPENDENT_AMBULATORY_CARE_PROVIDER_SITE_OTHER): Payer: Medicare PPO | Admitting: Nurse Practitioner

## 2020-01-30 VITALS — BP 130/80 | HR 61 | Temp 97.2°F | Ht 61.0 in | Wt 134.5 lb

## 2020-01-30 DIAGNOSIS — H00011 Hordeolum externum right upper eyelid: Secondary | ICD-10-CM | POA: Diagnosis not present

## 2020-01-30 MED ORDER — DOXYCYCLINE HYCLATE 100 MG PO TABS: 100.0000 mg | ORAL_TABLET | Freq: Two times a day (BID) | ORAL | 0 refills | Status: DC

## 2020-01-30 NOTE — Patient Instructions (Addendum)
To start warm compress 15 mins 4 times a day  To use baby shampoo to clean eyelid at least twice daily   To take doxycycline 100 mg by mouth twice daily -- take with food and probiotic twice daily   Ophthalmology if no better or worsening with treatment

## 2020-01-30 NOTE — Progress Notes (Signed)
Careteam: Patient Care Team: Gayland Curry, DO as PCP - General (Geriatric Medicine) Druscilla Brownie, MD as Consulting Physician (Dermatology) Garlan Fair, MD as Consulting Physician (Gastroenterology) Mosetta Anis, MD as Referring Physician (Allergy) Sueanne Margarita, MD as Consulting Physician (Cardiology)  PLACE OF SERVICE:  [facility]  Advanced Directive information    Allergies  Allergen Reactions  . Pollen Extract     Chief Complaint  Patient presents with  . Acute Visit    Complains of swollen and red right eye lid. Pateint states it is sore. Patient states it started Wednesday of last week.     HPI: Patient is a 79 y.o. female due to redness and swelling of top right eyelid for 10 days. Has not done anything to help it. It is getting worse, bigger, feels the tenderness more. No fever or chills. No visual changes, no red eye. Noticed eyelashes were facing up more this morning.    Review of Systems:  Review of Systems  Constitutional: Negative for chills and fever.  Eyes: Negative for blurred vision, double vision, photophobia, pain, discharge and redness.       Redness, tenderness to right eye lid    Past Medical History:  Diagnosis Date  . Allergy   . Ankle fracture, left   . Bigeminy   . Cataract   . Colon polyps   . Diverticulosis   . FH: colon cancer   . Menopausal and perimenopausal disorder   . Multiple allergies   . Osteoporosis 2008   PER DEXA SCAN  . PVC (premature ventricular contraction)    Past Surgical History:  Procedure Laterality Date  . APPENDECTOMY    . CATARACT EXTRACTION W/ INTRAOCULAR LENS  IMPLANT, BILATERAL    . PERFORATED APPENDIX    . SMALL BOWEL OBSTRUCTION     Social History:   reports that she has never smoked. She has never used smokeless tobacco. She reports previous alcohol use. She reports that she does not use drugs.  Family History  Problem Relation Age of Onset  . Colon cancer Mother   . CAD  Father   . Dementia Father   . Heart disease Father   . Colon cancer Brother   . Breast cancer Paternal Aunt     Medications: Patient's Medications  New Prescriptions   No medications on file  Previous Medications   CALCIUM CARB-ERGOCALCIFEROL 500-200 MG-UNIT TABS    Take 2 tablets by mouth daily.    CHOLECALCIFEROL (VITAMIN D3) 2000 UNITS CAPSULE    Take 2,000 Units by mouth every other day.   DENOSUMAB (PROLIA) 60 MG/ML SOLN INJECTION    Inject 60 mg into the skin every 6 (six) months. Administer in upper arm, thigh, or abdomen   NON FORMULARY    Antronex dietary supplement 1 per day   NON FORMULARY    Seneplex take 1 tablet once daily   NON FORMULARY    Bio-C-Plus, take tablet by mouth once daily   VITAMIN B-12 (CYANOCOBALAMIN) 1000 MCG TABLET    Take 1,000 mcg by mouth daily.  Modified Medications   No medications on file  Discontinued Medications   No medications on file    Physical Exam:  There were no vitals filed for this visit. There is no height or weight on file to calculate BMI. Wt Readings from Last 3 Encounters:  11/12/19 137 lb (62.1 kg)  09/03/19 135 lb (61.2 kg)  03/05/19 133 lb (60.3 kg)    Physical  Exam Eyes:     General:        Right eye: Hordeolum (large, redness to whole eyelid extending down) present. No foreign body or discharge.        Left eye: No hordeolum.     Extraocular Movements: Extraocular movements intact.     Labs reviewed: Basic Metabolic Panel: Recent Labs    02/25/19 0000 08/28/19 0800  NA 142 139  K 3.9 4.0  CL  --  102  CO2  --  25*  BUN 13 13  CREATININE 0.6 0.7   Liver Function Tests: Recent Labs    02/25/19 0500  AST 20  ALT 17  ALKPHOS 46   No results for input(s): LIPASE, AMYLASE in the last 8760 hours. No results for input(s): AMMONIA in the last 8760 hours. CBC: Recent Labs    02/25/19 0000 08/28/19 0800  WBC 3.7 3.6  HGB 12.8 12.9  HCT 38 39  PLT 163 139*   Lipid Panel: Recent Labs     02/25/19 0000 08/28/19 0800  CHOL 245* 231*  HDL 78* 76*  LDLCALC 149 138  TRIG 89 83   TSH: No results for input(s): TSH in the last 8760 hours. A1C: Lab Results  Component Value Date   HGBA1C 5.4 10/29/2014     Assessment/Plan 1. Hordeolum externum of right upper eyelid - to use warm compress QID for 15 minutes Baby shampoo to eyelashes BID - doxycycline (VIBRA-TABS) 100 MG tablet; Take 1 tablet (100 mg total) by mouth 2 (two) times daily.  Dispense: 14 tablet; Refill: 0 -if symptoms worsen or fail to improved recommended to go to ophthalmologist for follow up  Next appt: 03/10/2020 as scheduled.  [name]. [place], [facility] [phone]

## 2020-02-02 DIAGNOSIS — H0011 Chalazion right upper eyelid: Secondary | ICD-10-CM | POA: Diagnosis not present

## 2020-02-26 LAB — LIPID PANEL
HDL: 79 — AB (ref 35–70)
LDL Cholesterol: 130
Triglycerides: 71 (ref 40–160)

## 2020-03-03 ENCOUNTER — Encounter: Payer: Self-pay | Admitting: Internal Medicine

## 2020-03-10 ENCOUNTER — Encounter: Payer: Self-pay | Admitting: Internal Medicine

## 2020-03-10 ENCOUNTER — Non-Acute Institutional Stay: Payer: Medicare PPO | Admitting: Internal Medicine

## 2020-03-10 ENCOUNTER — Other Ambulatory Visit: Payer: Self-pay

## 2020-03-10 VITALS — BP 120/78 | HR 61 | Temp 97.2°F | Ht 61.0 in | Wt 135.0 lb

## 2020-03-10 DIAGNOSIS — M810 Age-related osteoporosis without current pathological fracture: Secondary | ICD-10-CM

## 2020-03-10 DIAGNOSIS — R2242 Localized swelling, mass and lump, left lower limb: Secondary | ICD-10-CM | POA: Diagnosis not present

## 2020-03-10 DIAGNOSIS — H00011 Hordeolum externum right upper eyelid: Secondary | ICD-10-CM | POA: Diagnosis not present

## 2020-03-10 DIAGNOSIS — E7800 Pure hypercholesterolemia, unspecified: Secondary | ICD-10-CM | POA: Diagnosis not present

## 2020-03-10 DIAGNOSIS — M79671 Pain in right foot: Secondary | ICD-10-CM

## 2020-03-10 DIAGNOSIS — Z636 Dependent relative needing care at home: Secondary | ICD-10-CM | POA: Diagnosis not present

## 2020-03-10 DIAGNOSIS — I10 Essential (primary) hypertension: Secondary | ICD-10-CM

## 2020-03-10 NOTE — Progress Notes (Signed)
Location:  [facility] clinic Provider:  Fairley Copher L. Mariea Clonts, D.O., C.M.D.  Code Status: DNR Goals of Care:  Advanced Directives 03/10/2020  Does Patient Have a Medical Advance Directive? Yes  Type of Advance Directive Out of facility DNR (pink MOST or yellow form)  Does patient want to make changes to medical advance directive? No - Patient declined  Copy of [place] in Chart? -  Pre-existing out of facility DNR order (yellow form or pink MOST form) Pink MOST form placed in chart (order not valid for inpatient use)     Chief Complaint  Patient presents with  . Medical Management of Chronic Issues    5 month follow up w/lab results   . Health Maintenance    Influenza (WS)   . Acute Visit    look at a bone in right foot     HPI: Patient is a 79 y.o. female seen today for medical management of chronic diseases.    She has a concern about the bone in her right foot.  Right in her metatarsal region of her foot--sort of b/w second and third toe--not bothersome at all today, bothers her at times when walking--feels like something is in there.    Neglected her compression hose yesterday.  Left remains swollen vs right.  Does the hose and puts her foot up.  Quit salt addition to food.  Still bigger but not like it was.  She did break her left ankle in the past.    Had hordeolum of top lid mid august.   Saw Janett Billow.  She referred her to ophtho and Dr. Ellie Lunch drained it.  It improved dramatically over a week and looks great now.   She had a film over the eye as part of it.  She did take her antibiotic also.    No problems with prolia.  Tolerating well.    Her husband told the neighborhood friends about his short-term memory loss and he's getting to be more at peace with it.  They went to his family in [place] and he was very tense about it there.  They went to her family and there was no pressure on him.  Her daughter, Benjamine Mola, caught on how he "cannot remember shit" and otherwise  he's like himself.  [place] has taken over the schedule for them.  She enjoys her husband more b/c they are not trying to cover up anything anymore.    She does exercise faithfully.  Not swimming now b/c of the scheduling.  She does an exercise video daily.   LDL has trended down as well as TG and total.  HDL improved.  She has to make Doug walk so there was less of that for a bit.  He does enjoy going along when she convinces him.  They also walk to events/meals, etc.    Past Medical History:  Diagnosis Date  . Allergy   . Ankle fracture, left   . Bigeminy   . Cataract   . Colon polyps   . Diverticulosis   . FH: colon cancer   . Menopausal and perimenopausal disorder   . Multiple allergies   . Osteoporosis 2008   PER DEXA SCAN  . PVC (premature ventricular contraction)     Past Surgical History:  Procedure Laterality Date  . APPENDECTOMY    . CATARACT EXTRACTION W/ INTRAOCULAR LENS  IMPLANT, BILATERAL    . PERFORATED APPENDIX    . SMALL BOWEL OBSTRUCTION  Allergies  Allergen Reactions  . Pollen Extract     Outpatient Encounter Medications as of 03/10/2020  Medication Sig  . Calcium Carb-Ergocalciferol 500-200 MG-UNIT TABS Take 2 tablets by mouth daily.   . Cholecalciferol (VITAMIN D3) 2000 units capsule Take 2,000 Units by mouth every other day.  . denosumab (PROLIA) 60 MG/ML SOLN injection Inject 60 mg into the skin every 6 (six) months. Administer in upper arm, thigh, or abdomen  . doxycycline (VIBRA-TABS) 100 MG tablet Take 1 tablet (100 mg total) by mouth 2 (two) times daily.  . NON FORMULARY Antronex dietary supplement 1 per day  . NON FORMULARY Seneplex take 1 tablet once daily  . NON FORMULARY Bio-C-Plus, take tablet by mouth once daily  . vitamin B-12 (CYANOCOBALAMIN) 1000 MCG tablet Take 1,000 mcg by mouth daily.   No facility-administered encounter medications on file as of 03/10/2020.    Review of Systems:  Review of Systems  Constitutional: Negative for  chills, fever and malaise/fatigue.  Eyes: Negative for blurred vision.       Hordeolum resolved  Respiratory: Negative for cough and shortness of breath.   Cardiovascular: Positive for leg swelling. Negative for chest pain and palpitations.  Gastrointestinal: Negative for abdominal pain, blood in stool and melena.  Genitourinary: Negative for dysuria.  Musculoskeletal: Negative for falls and joint pain.       Foot pain (right)  Skin: Negative for itching and rash.  Neurological: Negative for dizziness and loss of consciousness.  Psychiatric/Behavioral: Negative for depression and memory loss. The patient is not nervous/anxious and does not have insomnia.     Health Maintenance  Topic Date Due  . INFLUENZA VACCINE  01/18/2020  . MAMMOGRAM  11/26/2020  . COLONOSCOPY  12/03/2020  . TETANUS/TDAP  09/17/2021  . DEXA SCAN  Completed  . COVID-19 Vaccine  Completed  . PNA vac Low Risk Adult  Completed    Physical Exam: Vitals:   03/10/20 0835  BP: 120/78  Pulse: 61  Temp: (!) 97.2 F (36.2 C)  TempSrc: Temporal  SpO2: 97%  Weight: 135 lb (61.2 kg)  Height: 5\' 1"  (1.549 m)   Body mass index is 25.51 kg/m. Physical Exam Vitals reviewed.  Constitutional:      General: She is not in acute distress.    Appearance: Normal appearance. She is not toxic-appearing.  HENT:     Head: Normocephalic and atraumatic.  Eyes:     Extraocular Movements: Extraocular movements intact.     Conjunctiva/sclera: Conjunctivae normal.     Pupils: Pupils are equal, round, and reactive to light.  Cardiovascular:     Rate and Rhythm: Normal rate and regular rhythm.     Pulses: Normal pulses.     Heart sounds: Normal heart sounds.  Pulmonary:     Effort: Pulmonary effort is normal.     Breath sounds: Normal breath sounds. No wheezing, rhonchi or rales.  Musculoskeletal:        General: Normal range of motion.     Right lower leg: No edema.     Left lower leg: Edema present.     Comments:  Nonpitting edema left ankle; no current tenderness over midfoot but prominence of proximal end of 3rd metatarsal on plantar surface of foot  Neurological:     General: No focal deficit present.     Mental Status: She is alert and oriented to person, place, and time.  Psychiatric:        Mood and Affect: Mood normal.  Behavior: Behavior normal.        Thought Content: Thought content normal.        Judgment: Judgment normal.     Labs reviewed: Basic Metabolic Panel: Recent Labs    08/28/19 0800  NA 139  K 4.0  CL 102  CO2 25*  BUN 13  CREATININE 0.7   Liver Function Tests: No results for input(s): AST, ALT, ALKPHOS, BILITOT, PROT, ALBUMIN in the last 8760 hours. No results for input(s): LIPASE, AMYLASE in the last 8760 hours. No results for input(s): AMMONIA in the last 8760 hours. CBC: Recent Labs    08/28/19 0800  WBC 3.6  HGB 12.9  HCT 39  PLT 139*   Lipid Panel: Recent Labs    08/28/19 0800  CHOL 231*  HDL 76*  LDLCALC 138  TRIG 83   Lab Results  Component Value Date   HGBA1C 5.4 10/29/2014    Procedures since last visit: No results found.  Assessment/Plan 1. Right foot pain -suspect either due to anatomical difference (prominence of portion of metatarsal or 3rd phalange) osteophyte, or neuroma -if it becomes more bothersome, she's willing to get an exercise, but not a problem now  2. Localized swelling of left lower leg -remains mild vs right but is improved--had doppler that ruled out dvt and using compression hose, elevation and salt avoidance--cont this -prior trauma is likely cause  3. Hordeolum externum of right upper eyelid -resolved after drainage and abx with ophtho  4. Pure hypercholesterolemia -improving gradually with her exercise and healthy diet efforts--continue this  5. Senile osteoporosis -cont prolia, ca with D3, weightbearing exercise  6. Caregiver stress -doing much better here now that Marden Noble is accepting and sharing  his memory loss problem  7. Essential hypertension, benign -bp at goal, cont same routine and monitor  Labs/tests ordered:  Flp, bmp Next appt:  6 mos med mgt, fasting labs before  Lon Klippel L. Nuh Lipton, D.O. [facility] [address], [place] [address] Cell Phone (Mon-Fri 8am-5pm):  [phone] On Call:  [phone] & follow prompts after 5pm & weekends Office Phone:  [phone] Office Fax:  [phone]

## 2020-03-16 ENCOUNTER — Encounter: Payer: Self-pay | Admitting: Internal Medicine

## 2020-04-19 ENCOUNTER — Encounter: Payer: Medicare Other | Admitting: Family

## 2020-04-19 DIAGNOSIS — M7120 Synovial cyst of popliteal space [Baker], unspecified knee: Secondary | ICD-10-CM

## 2020-04-19 HISTORY — DX: Synovial cyst of popliteal space (Baker), unspecified knee: M71.20

## 2020-04-20 ENCOUNTER — Encounter: Payer: Medicare PPO | Admitting: Family

## 2020-04-20 ENCOUNTER — Other Ambulatory Visit: Payer: Self-pay

## 2020-04-21 ENCOUNTER — Other Ambulatory Visit: Payer: Self-pay

## 2020-04-21 ENCOUNTER — Encounter: Payer: Self-pay | Admitting: Internal Medicine

## 2020-04-21 ENCOUNTER — Non-Acute Institutional Stay: Payer: Medicare PPO | Admitting: Internal Medicine

## 2020-04-21 VITALS — BP 124/84 | HR 87 | Temp 97.2°F | Ht 61.0 in | Wt 134.4 lb

## 2020-04-21 DIAGNOSIS — M79605 Pain in left leg: Secondary | ICD-10-CM

## 2020-04-21 DIAGNOSIS — R2242 Localized swelling, mass and lump, left lower limb: Secondary | ICD-10-CM

## 2020-04-21 NOTE — Progress Notes (Signed)
Location:  [facility] Service:  Clinic (12)  Provider: Kaydan Wong L. Mariea Clonts, D.O., C.M.D.  Code Status: DNR Goals of Care:  Advanced Directives 04/21/2020  Does Patient Have a Medical Advance Directive? Yes  Type of Advance Directive Out of facility DNR (pink MOST or yellow form)  Does patient want to make changes to medical advance directive? No - Patient declined  Copy of [place] in Chart? -  Pre-existing out of facility DNR order (yellow form or pink MOST form) Pink MOST form placed in chart (order not valid for inpatient use)     Chief Complaint  Patient presents with  . Acute Visit    Patient complains of left leg pain x 3 weeks     HPI: Patient is a 79 y.o. female seen today for an acute visit for left leg pain.  There's not clear event.  She thinks she sprained it about 3 wks ago behind the knee--it hurt, but manageable.  About 2 wks ago, they went to Doug's brother's.  She sat in a recliner and twisted it some.  Strained behind left leg and medial ankle.  Got swollen again like it was when I'd seen it before.  When they got back a week ago, it was bad enough, she could barely walk on it and had to use tylenol to walk.  Last thurs she changed out the closet.  It hurt so bad, then she ironed and it still hurt.  Should could not move around easily.  She has not exercised since last fri.  This morning it feels a lot better.  It's stretched behind her left thigh/knee.  Ankle complicates it.  She also worries that with Doug's situation, she's got to be able to walk. She's been taking 2 tylenol three times a day since Friday.    She has quit using salt though she does still eat the food in the dining room.  She stopped wearing compression hose until 3 wks ago b/c ankle did go down.    Past Medical History:  Diagnosis Date  . Allergy   . Ankle fracture, left   . Bigeminy   . Cataract   . Colon polyps   . Diverticulosis   . FH: colon cancer   .  Menopausal and perimenopausal disorder   . Multiple allergies   . Osteoporosis 2008   PER DEXA SCAN  . PVC (premature ventricular contraction)     Past Surgical History:  Procedure Laterality Date  . APPENDECTOMY    . CATARACT EXTRACTION W/ INTRAOCULAR LENS  IMPLANT, BILATERAL    . PERFORATED APPENDIX    . SMALL BOWEL OBSTRUCTION      Allergies  Allergen Reactions  . Pollen Extract     Outpatient Encounter Medications as of 04/21/2020  Medication Sig  . Calcium Carb-Ergocalciferol 500-200 MG-UNIT TABS Take 2 tablets by mouth daily.   . Cholecalciferol (VITAMIN D3) 2000 units capsule Take 2,000 Units by mouth every other day.  . denosumab (PROLIA) 60 MG/ML SOLN injection Inject 60 mg into the skin every 6 (six) months. Administer in upper arm, thigh, or abdomen  . NON FORMULARY Antronex dietary supplement 1 per day  . NON FORMULARY Seneplex take 1 tablet once daily  . NON FORMULARY Bio-C-Plus, take tablet by mouth once daily  . vitamin B-12 (CYANOCOBALAMIN) 1000 MCG tablet Take 1,000 mcg by mouth daily.   No facility-administered encounter medications on file as of 04/21/2020.    Review  of Systems:  Review of Systems  Constitutional: Negative for chills, fever and malaise/fatigue.  HENT: Negative for congestion and sore throat.   Eyes: Negative for blurred vision.  Respiratory: Negative for cough and shortness of breath.   Cardiovascular: Positive for leg swelling. Negative for chest pain and palpitations.  Gastrointestinal: Negative for abdominal pain, blood in stool, constipation, diarrhea and melena.  Genitourinary: Negative for dysuria.  Musculoskeletal: Positive for joint pain and myalgias. Negative for falls.       Left posterior thigh/behind knee/left foot  Skin: Negative for itching and rash.  Neurological: Negative for dizziness and loss of consciousness.  Psychiatric/Behavioral: Negative for memory loss. The patient is nervous/anxious.     Health Maintenance   Topic Date Due  . MAMMOGRAM  11/26/2020  . COLONOSCOPY  12/03/2020  . TETANUS/TDAP  09/17/2021  . INFLUENZA VACCINE  Completed  . DEXA SCAN  Completed  . COVID-19 Vaccine  Completed  . PNA vac Low Risk Adult  Completed    Physical Exam: Vitals:   04/21/20 1044  BP: 124/84  Pulse: 87  Temp: (!) 97.2 F (36.2 C)  TempSrc: Temporal  SpO2: 99%  Weight: 134 lb 6.4 oz (61 kg)  Height: 5\' 1"  (1.549 m)   Body mass index is 25.39 kg/m. Physical Exam Vitals reviewed.  Constitutional:      General: She is not in acute distress.    Appearance: Normal appearance. She is not toxic-appearing.  HENT:     Head: Normocephalic and atraumatic.  Eyes:     Comments: glasses  Cardiovascular:     Rate and Rhythm: Normal rate and regular rhythm.  Pulmonary:     Effort: Pulmonary effort is normal.     Breath sounds: Normal breath sounds. No rales.  Musculoskeletal:        General: Swelling present. Normal range of motion.     Left lower leg: Edema present.     Comments: No tenderness; notable nonpitting edema of the left vs right leg from knee distal; warmth over left knee vs right  Skin:    General: Skin is warm and dry.  Neurological:     General: No focal deficit present.     Mental Status: She is alert and oriented to person, place, and time.     Motor: No weakness.     Gait: Gait normal.  Psychiatric:        Mood and Affect: Mood normal.        Behavior: Behavior normal.     Labs reviewed: Basic Metabolic Panel: Recent Labs    08/28/19 0800  NA 139  K 4.0  CL 102  CO2 25*  BUN 13  CREATININE 0.7   Liver Function Tests: No results for input(s): AST, ALT, ALKPHOS, BILITOT, PROT, ALBUMIN in the last 8760 hours. No results for input(s): LIPASE, AMYLASE in the last 8760 hours. No results for input(s): AMMONIA in the last 8760 hours. CBC: Recent Labs    08/28/19 0800  WBC 3.6  HGB 12.9  HCT 39  PLT 139*   Lipid Panel: Recent Labs    08/28/19 0800 02/26/20  0000  CHOL 231*  --   HDL 76* 79*  LDLCALC 138 130  TRIG 83 71   Lab Results  Component Value Date   HGBA1C 5.4 10/29/2014    Procedures since last visit: No results found.  Assessment/Plan 1. Localized swelling of left lower leg - suspect possible Baker's cyst of left leg -had prior negative venous  doppler - US LT LOWER EXTREM LTD SOFT TISSUE NON VASCULAR; Future  2. Pain in posterior left lower extremity - r/o Baker's cyst - [place] LT LOWER EXTREM LTD SOFT TISSUE NON VASCULAR; Future  Labs/tests ordered:   Orders Placed This Encounter  Procedures  . [place] LT LOWER EXTREM LTD SOFT TISSUE NON VASCULAR    Standing Status:   Future    Standing Expiration Date:   04/21/2021    Order Specific Question:   Reason for Exam (SYMPTOM  OR DIAGNOSIS REQUIRED)    Answer:   left leg posterior popliteal fossa pain, swelling    Order Specific Question:   Preferred imaging location?    Answer:   [facility]    Order Specific Question:   Release to patient    Answer:   Immediate    Next appt:  06/10/2020  Shjon Lizarraga L. Giovonni Poirier, D.O. [facility] [address], [place] [address] Cell Phone (Mon-Fri 8am-5pm):  [phone] On Call:  [phone] & follow prompts after 5pm & weekends Office Phone:  [phone] Office Fax:  [phone]

## 2020-04-28 ENCOUNTER — Ambulatory Visit
Admission: RE | Admit: 2020-04-28 | Discharge: 2020-04-28 | Disposition: A | Discharge status: Home / Self Care | Payer: Medicare PPO | Source: Ambulatory Visit | Attending: Internal Medicine | Admitting: Internal Medicine

## 2020-04-28 DIAGNOSIS — M79605 Pain in left leg: Secondary | ICD-10-CM

## 2020-04-28 DIAGNOSIS — M7989 Other specified soft tissue disorders: Secondary | ICD-10-CM | POA: Diagnosis not present

## 2020-04-28 DIAGNOSIS — R2242 Localized swelling, mass and lump, left lower limb: Secondary | ICD-10-CM

## 2020-04-29 ENCOUNTER — Other Ambulatory Visit: Payer: Self-pay | Admitting: Internal Medicine

## 2020-04-29 DIAGNOSIS — M7122 Synovial cyst of popliteal space [Baker], left knee: Secondary | ICD-10-CM

## 2020-04-29 NOTE — Progress Notes (Signed)
I would like her to hold off on lower body exercise right now. I do recommend she have the procedure since she is still having such pain.

## 2020-04-29 NOTE — Progress Notes (Signed)
She does have two fluid collections behind her knee.  One is a Baker's cyst and there is one under it.  If she is continuing with significant discomfort and swelling, we can refer her for interventional radiology drainage and steroid injection for relief.

## 2020-04-29 NOTE — Progress Notes (Signed)
Referral placed.

## 2020-05-05 DIAGNOSIS — D225 Melanocytic nevi of trunk: Secondary | ICD-10-CM | POA: Diagnosis not present

## 2020-05-05 DIAGNOSIS — L821 Other seborrheic keratosis: Secondary | ICD-10-CM | POA: Diagnosis not present

## 2020-05-05 DIAGNOSIS — L814 Other melanin hyperpigmentation: Secondary | ICD-10-CM | POA: Diagnosis not present

## 2020-05-06 ENCOUNTER — Other Ambulatory Visit: Payer: Self-pay | Admitting: Internal Medicine

## 2020-05-06 DIAGNOSIS — M7122 Synovial cyst of popliteal space [Baker], left knee: Secondary | ICD-10-CM

## 2020-05-12 ENCOUNTER — Ambulatory Visit
Admission: RE | Admit: 2020-05-12 | Discharge: 2020-05-12 | Disposition: A | Discharge status: Home / Self Care | Payer: Medicare PPO | Source: Ambulatory Visit | Attending: Internal Medicine | Admitting: Internal Medicine

## 2020-05-12 DIAGNOSIS — M7122 Synovial cyst of popliteal space [Baker], left knee: Secondary | ICD-10-CM | POA: Diagnosis not present

## 2020-05-12 HISTORY — PX: IR US GUIDE BX ASP/DRAIN: IMG2392

## 2020-05-12 NOTE — Procedures (Signed)
Interventional Radiology Procedure Note  Procedure: Ultrasound guided left Baker cyst aspiration and steroid injection  Findings: Please refer to procedural dictation for full description. Large, posterior cyst in addition to smaller, more medial cyst on ultrasound.  Unable to aspirate smaller, medial cyst.  Approximately 7 mL thick, serous fluid aspirated from larger cyst.  Injection of 12.5 mg bupivicaine and 40 mg methylprednisolone (2.5 mL total).  Complications: None immediate  Estimated Blood Loss: <60mL  Recommendations: Return to IR clinic PRN if symptoms persist or worsen.   Ruthann Cancer, MD Pager: [phone]

## 2020-05-20 ENCOUNTER — Ambulatory Visit (INDEPENDENT_AMBULATORY_CARE_PROVIDER_SITE_OTHER): Payer: Medicare PPO | Admitting: Internal Medicine

## 2020-05-20 ENCOUNTER — Encounter: Payer: Self-pay | Admitting: Internal Medicine

## 2020-05-20 ENCOUNTER — Other Ambulatory Visit: Payer: Self-pay

## 2020-05-20 VITALS — BP 128/84 | HR 70 | Temp 97.7°F | Ht 61.0 in | Wt 130.0 lb

## 2020-05-20 DIAGNOSIS — R2242 Localized swelling, mass and lump, left lower limb: Secondary | ICD-10-CM | POA: Diagnosis not present

## 2020-05-20 DIAGNOSIS — M7122 Synovial cyst of popliteal space [Baker], left knee: Secondary | ICD-10-CM

## 2020-05-20 DIAGNOSIS — M79605 Pain in left leg: Secondary | ICD-10-CM | POA: Diagnosis not present

## 2020-05-20 NOTE — Patient Instructions (Signed)
Elevate legs at rest Apply ice for 20 minutes twice a day to the left lower leg swollen area. Gradually resume your exercise:  Start off doing the flex and flow and then in another week, if the pain continues to improve, add back your chairfit2 program.

## 2020-05-20 NOTE — Progress Notes (Signed)
Location:  [facility] clinic Provider: Pauleen Goleman L. Mariea Clonts, D.O., C.M.D.  Code Status: DNR Goals of Care:  Advanced Directives 05/20/2020  Does Patient Have a Medical Advance Directive? Yes  Type of Advance Directive Out of facility DNR (pink MOST or yellow form)  Does patient want to make changes to medical advance directive? No - Patient declined  Copy of [place] in Chart? -  Pre-existing out of facility DNR order (yellow form or pink MOST form) Pink MOST form placed in chart (order not valid for inpatient use)     Chief Complaint  Patient presents with  . Acute Visit    Discuss leg pain afte procedure, ankles swell at [place]     HPI: Patient is a 79 y.o. female seen today for an acute visit for left upper medial leg pain.  She actually has had considerable improvement in that localized spot of tenderness in the last day.  She is wondering about how to "run her life" right now in relation to the pain and some residual swelling of the left leg.    Past Medical History:  Diagnosis Date  . Allergy   . Ankle fracture, left   . Bigeminy   . Cataract   . Colon polyps   . Diverticulosis   . FH: colon cancer   . Menopausal and perimenopausal disorder   . Multiple allergies   . Osteoporosis 2008   PER DEXA SCAN  . PVC (premature ventricular contraction)     Past Surgical History:  Procedure Laterality Date  . APPENDECTOMY    . CATARACT EXTRACTION W/ INTRAOCULAR LENS  IMPLANT, BILATERAL    . IR US GUIDE BX ASP/DRAIN  05/12/2020  . PERFORATED APPENDIX    . SMALL BOWEL OBSTRUCTION      Allergies  Allergen Reactions  . Pollen Extract     Outpatient Encounter Medications as of 05/20/2020  Medication Sig  . Calcium Carb-Ergocalciferol 500-200 MG-UNIT TABS Take 2 tablets by mouth daily.   . Cholecalciferol (VITAMIN D3) 2000 units capsule Take 2,000 Units by mouth every other day.  . denosumab (PROLIA) 60 MG/ML SOLN injection Inject 60 mg into the skin every 6  (six) months. Administer in upper arm, thigh, or abdomen  . NON FORMULARY Antronex dietary supplement 1 per day  . NON FORMULARY Seneplex take 1 tablet once daily  . NON FORMULARY Bio-C-Plus, take tablet by mouth once daily  . vitamin B-12 (CYANOCOBALAMIN) 1000 MCG tablet Take 1,000 mcg by mouth daily.   No facility-administered encounter medications on file as of 05/20/2020.    Review of Systems:  Review of Systems  Constitutional: Negative for chills and fever.  HENT: Negative for congestion and sore throat.   Eyes: Negative for blurred vision.  Respiratory: Negative for shortness of breath.   Cardiovascular: Negative for chest pain, palpitations and leg swelling.  Gastrointestinal: Negative for abdominal pain.  Genitourinary: Negative for dysuria.  Musculoskeletal: Negative for falls.       Left lower leg pain medially  Skin: Negative for itching and rash.  Neurological: Negative for dizziness and loss of consciousness.  Psychiatric/Behavioral: Negative for depression and memory loss. The patient is not nervous/anxious and does not have insomnia.    Health Maintenance  Topic Date Due  . MAMMOGRAM  11/26/2020  . COLONOSCOPY  12/03/2020  . TETANUS/TDAP  09/17/2021  . INFLUENZA VACCINE  Completed  . DEXA SCAN  Completed  . COVID-19 Vaccine  Completed  . PNA vac Low Risk  Adult  Completed    Physical Exam: Vitals:   05/20/20 1453  BP: 128/84  Pulse: 70  Temp: 97.7 F (36.5 C)  SpO2: 97%  Weight: 130 lb (59 kg)  Height: 5\' 1"  (1.549 m)   Body mass index is 24.56 kg/m. Physical Exam Vitals reviewed.  Constitutional:      Appearance: Normal appearance.  Eyes:     Comments: glasses  Pulmonary:     Effort: Pulmonary effort is normal.  Musculoskeletal:     Right lower leg: No edema.     Left lower leg: Edema present.     Comments: Left medial lower leg tender distal to knee; small area of residual swelling there and generalized nonpitting edema of left leg (much  improved vs last week and prior)  Neurological:     General: No focal deficit present.     Mental Status: She is alert and oriented to person, place, and time.  Psychiatric:        Mood and Affect: Mood normal.        Behavior: Behavior normal.     Labs reviewed: Basic Metabolic Panel: Recent Labs    08/28/19 0800  NA 139  K 4.0  CL 102  CO2 25*  BUN 13  CREATININE 0.7   Liver Function Tests: No results for input(s): AST, ALT, ALKPHOS, BILITOT, PROT, ALBUMIN in the last 8760 hours. No results for input(s): LIPASE, AMYLASE in the last 8760 hours. No results for input(s): AMMONIA in the last 8760 hours. CBC: Recent Labs    08/28/19 0800  WBC 3.6  HGB 12.9  HCT 39  PLT 139*   Lipid Panel: Recent Labs    08/28/19 0800 02/26/20 0000  CHOL 231*  --   HDL 76* 79*  LDLCALC 138 130  TRIG 83 71   Lab Results  Component Value Date   HGBA1C 5.4 10/29/2014    Procedures since last visit: IR US Guide Bx Asp/Drain  Result Date: 05/12/2020 INDICATION: Symptomatic left-sided Baker cyst. EXAM: IR ULTRASOUND GUIDANCE COMPARISON:  Left knee soft tissue ultrasound-04/28/2020 MEDICATIONS: 40 mg Depo-Medrol diluted in 2 ml of 0.25% bupivicaine ANESTHESIA/SEDATION: None CONTRAST:  None COMPLICATIONS: None immediate. PROCEDURE: Informed written consent was obtained from the patient after a discussion of the risks, benefits and alternatives to treatment. The patient was placed prone on the ultrasound table with preprocedural imaging demonstrating grossly unchanged appearance of the moderate to large size complex Baker cyst within the soft tissues about the posterior medial aspect of the left knee. A separate, approximately 1 cm hypoechoic region along the medial knee was also identified. The skin overlying the posterior aspect of the left knee was prepped and draped in usual sterile fashion. The overlying soft tissues were anesthetized with 1% lidocaine. An 18 gauge needle was advanced  into the more medial, smaller cyst under direct ultrasound visualization. Aspiration was unable to be performed, likely due to viscosity of the indwelling fluid. Next, the dominant cystic component of the Baker's cyst in the posterior knee was accessed under ultrasound guidance with an 18 gauge needle. Approximately 7 cc of serous, slightly viscous fluid was aspirated. At this time, a combination of 40 mg Depo-Medrol diluted in 2 ml of 0.25% bupivacaine was administered into the Baker's cyst. The needle was removed and superficial hemostasis was achieved with manual compression. A dressing was placed. The patient tolerated the procedure well without immediate postprocedural complication. IMPRESSION: Successful ultrasound-guided steroid injection and aspiration approximately 7 cc of serous, slightly  viscous fluid from the dominant cystic component of the otherwise unchanged left-sided Baker cyst. The smaller, more medial component was attempted to be aspirated, however was unsuccessful, likely due to a disc ostia of the indwelling fluid. Ruthann Cancer, MD [facility] Electronically Signed   By: Ruthann Cancer MD   On: 05/12/2020 09:56   [place] LT LOWER EXTREM LTD SOFT TISSUE NON VASCULAR  Result Date: 04/28/2020 CLINICAL DATA:  79 year old female with swelling of the left popliteal fossa. EXAM: ULTRASOUND left LOWER EXTREMITY LIMITED TECHNIQUE: Ultrasound examination of the lower extremity soft tissues was performed in the area of clinical concern. COMPARISON:  None. FINDINGS: Targeted sonographic images of the soft tissues of the left popliteal fossa in the region of the clinical concern was performed. There is a 5.1 x 0.6 x 2.9 cm fluid collection in the medial posterior left knee which may represent a Baker's cyst. An additional 1.4 x 0.7 x 0.9 cm collection is noted more inferiorly. No hyperemia. IMPRESSION: Two fluid collections in the left popliteal  fossa. Electronically Signed   By: Anner Crete M.D.   On: 04/28/2020 21:42    Assessment/Plan 1. Left leg pain -proximal medial anterior leg  -seems it is getting better after drainage of her cyst last Wednesday and steroid injection -ice bid and elevate feet at rest  2. Localized swelling of left lower leg -improving gradually -ok to continue off compression sock b/c of her pain being right at the superior aspect  3. Baker's cyst of knee, left -s/p drainage and injection with benefit  Labs/tests ordered:  None added today  Next appt:  06/10/2020 is [place], keep march appt with me with labs at [facility] before  [place]. Jaymien Landin, D.O. [facility] [address], [place] [address] Cell Phone (Mon-Fri 8am-5pm):  [phone] On Call:  [phone] & follow prompts after 5pm & weekends Office Phone:  [phone] Office Fax:  [phone]

## 2020-06-10 ENCOUNTER — Ambulatory Visit (INDEPENDENT_AMBULATORY_CARE_PROVIDER_SITE_OTHER): Payer: Medicare PPO | Admitting: *Deleted

## 2020-06-10 ENCOUNTER — Other Ambulatory Visit: Payer: Self-pay

## 2020-06-10 DIAGNOSIS — M810 Age-related osteoporosis without current pathological fracture: Secondary | ICD-10-CM | POA: Diagnosis not present

## 2020-06-10 MED ORDER — DENOSUMAB 60 MG/ML ~~LOC~~ SOSY
60.0000 mg | PREFILLED_SYRINGE | Freq: Once | SUBCUTANEOUS | Status: AC
  Administered 2020-06-10: 60 mg via SUBCUTANEOUS

## 2020-06-15 ENCOUNTER — Telehealth: Payer: Self-pay | Admitting: Nurse Practitioner

## 2020-06-15 NOTE — Telephone Encounter (Signed)
I called the patient and left a message for her to call back when she could to schedule an awv her last one was 03/2019

## 2020-06-15 NOTE — Telephone Encounter (Signed)
-----   Message from Sharon Seller, NP sent at 05/27/2020  3:25 PM EST ----- Due to AWV, last was 03/2019 Please call to schedule. Thank you

## 2020-06-17 ENCOUNTER — Ambulatory Visit (INDEPENDENT_AMBULATORY_CARE_PROVIDER_SITE_OTHER): Payer: Medicare PPO | Admitting: Nurse Practitioner

## 2020-06-17 ENCOUNTER — Encounter: Payer: Self-pay | Admitting: Nurse Practitioner

## 2020-06-17 ENCOUNTER — Other Ambulatory Visit: Payer: Self-pay

## 2020-06-17 ENCOUNTER — Telehealth: Payer: Self-pay

## 2020-06-17 DIAGNOSIS — Z0000 Encounter for general adult medical examination without abnormal findings: Secondary | ICD-10-CM | POA: Diagnosis not present

## 2020-06-17 NOTE — Progress Notes (Signed)
This service is provided via telemedicine  No vital signs collected/recorded due to the encounter was a telemedicine visit.   Location of patient (ex: home, work):  Home  Patient consents to a telephone visit: Yes, see encounter dated 06/17/2020  Location of the provider (ex: office, home):  [facility]  Name of any referring provider:  Bufford Spikes, DO  Names of all persons participating in the telemedicine service and their role in the encounter:  Abbey Chatters, Nurse Practitioner, Elveria Royals, CMA, and patient.   Time spent on call:  10 minutes with medical assistant

## 2020-06-17 NOTE — Patient Instructions (Addendum)
Traci Woodard , Thank you for taking time to come for your Medicare Wellness Visit. I appreciate your ongoing commitment to your health goals. Please review the following plan we discussed and let me know if I can assist you in the future.   Screening recommendations/referrals: Colonoscopy aged out Mammogram aged out Bone Density up to date Recommended yearly ophthalmology/optometry visit for glaucoma screening and checkup Recommended yearly dental visit for hygiene and checkup  Vaccinations: Influenza vaccine up to date Pneumococcal vaccine up to date Tdap vaccine up to date Shingles vaccine up to date    Advanced directives: on file.   Conditions/risks identified: advanced age, osteoporosis, balance and mobility.   Next appointment: 1 year   Preventive Care 23 Years and Older, Female Preventive care refers to lifestyle choices and visits with your health care provider that can promote health and wellness. What does preventive care include?  A yearly physical exam. This is also called an annual well check.  Dental exams once or twice a year.  Routine eye exams. Ask your health care provider how often you should have your eyes checked.  Personal lifestyle choices, including:  Daily care of your teeth and gums.  Regular physical activity.  Eating a healthy diet.  Avoiding tobacco and drug use.  Limiting alcohol use.  Practicing safe sex.  Taking low-dose aspirin every day.  Taking vitamin and mineral supplements as recommended by your health care provider. What happens during an annual well check? The services and screenings done by your health care provider during your annual well check will depend on your age, overall health, lifestyle risk factors, and family history of disease. Counseling  Your health care provider may ask you questions about your:  Alcohol use.  Tobacco use.  Drug use.  Emotional well-being.  Home and relationship well-being.  Sexual  activity.  Eating habits.  History of falls.  Memory and ability to understand (cognition).  Work and work Astronomer.  Reproductive health. Screening  You may have the following tests or measurements:  Height, weight, and BMI.  Blood pressure.  Lipid and cholesterol levels. These may be checked every 5 years, or more frequently if you are over 2 years old.  Skin check.  Lung cancer screening. You may have this screening every year starting at age 68 if you have a 30-pack-year history of smoking and currently smoke or have quit within the past 15 years.  Fecal occult blood test (FOBT) of the stool. You may have this test every year starting at age 24.  Flexible sigmoidoscopy or colonoscopy. You may have a sigmoidoscopy every 5 years or a colonoscopy every 10 years starting at age 7.  Hepatitis C blood test.  Hepatitis B blood test.  Sexually transmitted disease (STD) testing.  Diabetes screening. This is done by checking your blood sugar (glucose) after you have not eaten for a while (fasting). You may have this done every 1-3 years.  Bone density scan. This is done to screen for osteoporosis. You may have this done starting at age 66.  Mammogram. This may be done every 1-2 years. Talk to your health care provider about how often you should have regular mammograms. Talk with your health care provider about your test results, treatment options, and if necessary, the need for more tests. Vaccines  Your health care provider may recommend certain vaccines, such as:  Influenza vaccine. This is recommended every year.  Tetanus, diphtheria, and acellular pertussis (Tdap, Td) vaccine. You may need a Td  booster every 10 years.  Zoster vaccine. You may need this after age 19.  Pneumococcal 13-valent conjugate (PCV13) vaccine. One dose is recommended after age 38.  Pneumococcal polysaccharide (PPSV23) vaccine. One dose is recommended after age 55. Talk to your health care  provider about which screenings and vaccines you need and how often you need them. This information is not intended to replace advice given to you by your health care provider. Make sure you discuss any questions you have with your health care provider. Document Released: 07/02/2015 Document Revised: 02/23/2016 Document Reviewed: 04/06/2015 Elsevier Interactive Patient Education  2017 [place] Prevention in the Home Falls can cause injuries. They can happen to people of all ages. There are many things you can do to make your home safe and to help prevent falls. What can I do on the outside of my home?  Regularly fix the edges of walkways and driveways and fix any cracks.  Remove anything that might make you trip as you walk through a door, such as a raised step or threshold.  Trim any bushes or trees on the path to your home.  Use bright outdoor lighting.  Clear any walking paths of anything that might make someone trip, such as rocks or tools.  Regularly check to see if handrails are loose or broken. Make sure that both sides of any steps have handrails.  Any raised decks and porches should have guardrails on the edges.  Have any leaves, snow, or ice cleared regularly.  Use sand or salt on walking paths during winter.  Clean up any spills in your garage right away. This includes oil or grease spills. What can I do in the bathroom?  Use night lights.  Install grab bars by the toilet and in the tub and shower. Do not use towel bars as grab bars.  Use non-skid mats or decals in the tub or shower.  If you need to sit down in the shower, use a plastic, non-slip stool.  Keep the floor dry. Clean up any water that spills on the floor as soon as it happens.  Remove soap buildup in the tub or shower regularly.  Attach bath mats securely with double-sided non-slip rug tape.  Do not have throw rugs and other things on the floor that can make you trip. What can I do in  the bedroom?  Use night lights.  Make sure that you have a light by your bed that is easy to reach.  Do not use any sheets or blankets that are too big for your bed. They should not hang down onto the floor.  Have a firm chair that has side arms. You can use this for support while you get dressed.  Do not have throw rugs and other things on the floor that can make you trip. What can I do in the kitchen?  Clean up any spills right away.  Avoid walking on wet floors.  Keep items that you use a lot in easy-to-reach places.  If you need to reach something above you, use a strong step stool that has a grab bar.  Keep electrical cords out of the way.  Do not use floor polish or wax that makes floors slippery. If you must use wax, use non-skid floor wax.  Do not have throw rugs and other things on the floor that can make you trip. What can I do with my stairs?  Do not leave any items on the stairs.  Make sure that there are handrails on both sides of the stairs and use them. Fix handrails that are broken or loose. Make sure that handrails are as long as the stairways.  Check any carpeting to make sure that it is firmly attached to the stairs. Fix any carpet that is loose or worn.  Avoid having throw rugs at the top or bottom of the stairs. If you do have throw rugs, attach them to the floor with carpet tape.  Make sure that you have a light switch at the top of the stairs and the bottom of the stairs. If you do not have them, ask someone to add them for you. What else can I do to help prevent falls?  Wear shoes that:  Do not have high heels.  Have rubber bottoms.  Are comfortable and fit you well.  Are closed at the toe. Do not wear sandals.  If you use a stepladder:  Make sure that it is fully opened. Do not climb a closed stepladder.  Make sure that both sides of the stepladder are locked into place.  Ask someone to hold it for you, if possible.  Clearly mark and  make sure that you can see:  Any grab bars or handrails.  First and last steps.  Where the edge of each step is.  Use tools that help you move around (mobility aids) if they are needed. These include:  Canes.  Walkers.  Scooters.  Crutches.  Turn on the lights when you go into a dark area. Replace any light bulbs as soon as they burn out.  Set up your furniture so you have a clear path. Avoid moving your furniture around.  If any of your floors are uneven, fix them.  If there are any pets around you, be aware of where they are.  Review your medicines with your doctor. Some medicines can make you feel dizzy. This can increase your chance of falling. Ask your doctor what other things that you can do to help prevent falls. This information is not intended to replace advice given to you by your health care provider. Make sure you discuss any questions you have with your health care provider. Document Released: 04/01/2009 Document Revised: 11/11/2015 Document Reviewed: 07/10/2014 Elsevier Interactive Patient Education  2017 [employer].

## 2020-06-17 NOTE — Telephone Encounter (Signed)
Ms. fallen, crisostomo are scheduled for a virtual visit with your provider today.    Just as we do with appointments in the office, we must obtain your consent to participate.  Your consent will be active for this visit and any virtual visit you may have with one of our providers in the next 365 days.    If you have a MyChart account, I can also send a copy of this consent to you electronically.  All virtual visits are billed to your insurance company just like a traditional visit in the office.  As this is a virtual visit, video technology does not allow for your provider to perform a traditional examination.  This may limit your provider's ability to fully assess your condition.  If your provider identifies any concerns that need to be evaluated in person or the need to arrange testing such as labs, EKG, etc, we will make arrangements to do so.    Although advances in technology are sophisticated, we cannot ensure that it will always work on either your end or our end.  If the connection with a video visit is poor, we may have to switch to a telephone visit.  With either a video or telephone visit, we are not always able to ensure that we have a secure connection.   I need to obtain your verbal consent now.   Are you willing to proceed with your visit today?   Traci Woodard has provided verbal consent on 06/17/2020 for a virtual visit (video or telephone).   Elveria Royals, CMA 06/17/2020  9:07 AM

## 2020-06-17 NOTE — Progress Notes (Signed)
Subjective:   Traci Woodard is a 79 y.o. female who presents for Medicare Annual (Subsequent) preventive examination.  Review of Systems     Cardiac Risk Factors include: advanced age (>75men, >51 women);family history of premature cardiovascular disease     Objective:    There were no vitals filed for this visit. There is no height or weight on file to calculate BMI.  Advanced Directives 06/17/2020 05/20/2020 04/21/2020 03/10/2020 11/12/2019 09/03/2019 03/05/2019  Does Patient Have a Medical Advance Directive? Yes Yes Yes Yes Yes Yes Yes  Type of Estate agent of [place];Out of facility DNR (pink MOST or yellow form) Out of facility DNR (pink MOST or yellow form) Out of facility DNR (pink MOST or yellow form) Out of facility DNR (pink MOST or yellow form) Out of facility DNR (pink MOST or yellow form) Out of facility DNR (pink MOST or yellow form) Healthcare Power of [place];Out of facility DNR (pink MOST or yellow form)  Does patient want to make changes to medical advance directive? No - Patient declined No - Patient declined No - Patient declined No - Patient declined No - Patient declined No - Patient declined No - Patient declined  Copy of Healthcare Power of Attorney in Chart? Yes - validated most recent copy scanned in chart (See row information) - - - - - Yes - validated most recent copy scanned in chart (See row information)  Pre-existing out of facility DNR order (yellow form or pink MOST form) - Pink MOST form placed in chart (order not valid for inpatient use) Pink MOST form placed in chart (order not valid for inpatient use) Pink MOST form placed in chart (order not valid for inpatient use) Pink MOST/Yellow Form most recent copy in chart - Physician notified to receive inpatient order Yellow form placed in chart (order not valid for inpatient use) Yellow form placed in chart (order not valid for inpatient use)    Current Medications (verified) Outpatient  Encounter Medications as of 06/17/2020  Medication Sig   Calcium Carb-Ergocalciferol 500-200 MG-UNIT TABS Take 2 tablets by mouth daily.    Cholecalciferol (VITAMIN D3) 2000 units capsule Take 2,000 Units by mouth every other day.   denosumab (PROLIA) 60 MG/ML SOLN injection Inject 60 mg into the skin every 6 (six) months. Administer in upper arm, thigh, or abdomen   NON FORMULARY Antronex dietary supplement 1 per day   NON FORMULARY Seneplex take 1 tablet once daily   NON FORMULARY Bio-C-Plus, take tablet by mouth once daily   vitamin B-12 (CYANOCOBALAMIN) 1000 MCG tablet Take 1,000 mcg by mouth daily.   No facility-administered encounter medications on file as of 06/17/2020.    Allergies (verified) Pollen extract   History: Past Medical History:  Diagnosis Date   Allergy    Ankle fracture, left    Baker cyst 04/19/2020   Bigeminy    Cataract    Colon polyps    Diverticulosis    FH: colon cancer    Menopausal and perimenopausal disorder    Multiple allergies    Osteoporosis 2008   PER DEXA SCAN   PVC (premature ventricular contraction)    Past Surgical History:  Procedure Laterality Date   APPENDECTOMY     CATARACT EXTRACTION W/ INTRAOCULAR LENS  IMPLANT, BILATERAL     IR US GUIDE BX ASP/DRAIN  05/12/2020   PERFORATED APPENDIX     SMALL BOWEL OBSTRUCTION     Family History  Problem Relation Age of Onset  Colon cancer Mother    CAD Father    Dementia Father    Heart disease Father    Colon cancer Brother    Breast cancer Paternal Aunt    Social History   Socioeconomic History   Marital status: Married    Spouse name: Not on file   Number of children: Not on file   Years of education: Not on file   Highest education level: Not on file  Occupational History   Occupation: Retired    Comment: Tourist information centre manager  Tobacco Use   Smoking status: Never Smoker   Smokeless tobacco: Never Used  Scientific laboratory technician Use: Never used   Substance and Sexual Activity   Alcohol use: Yes    Comment: occassiionally   Drug use: No   Sexual activity: Not on file  Other Topics Concern   Not on file  Social History Narrative   Tobacco use, amount per day now:0   Past tobacco use, amount per day:0   How many years did you use tobacco:0   Alcohol use (drinks per week):7-14   Diet:FRUITS, VEGETABLES, YOGURT, LEAN MEAT AND FISH, FEW BREADS AND DESSERTS    Do you drink/eat things with caffeine:YES, COFFEE, CHOCOLATE   Marital status: MARRIED                               What year were you married? 1980   Do you live in a house, apartment, assisted living, condo, trailer, etc.? [place]   Is it one or more stories? ONE   How many persons live in your home? 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   YES                               Type and how often? WALK AND STRETCH DAILY   Do you have a living will? YES   Do you have a DNR form?  I THINK SO              If not, do you want to discuss one?   Do you have signed POA/HPOA forms?  YES                      If so, please bring to you appointment   Social Determinants of Health   Financial Resource Strain: Not on file  Food Insecurity: Not on file  Transportation Needs: Not on file  Physical Activity: Not on file  Stress: Not on file  Social Connections: Not on file    Tobacco Counseling Counseling given: Not Answered   Clinical Intake:  Pre-visit preparation completed: Yes  Pain : No/denies pain     BMI - recorded: 24 Nutritional Status: BMI of 19-24  Normal Nutritional Risks: None Diabetes: No  How often do you need to have someone help you when you read instructions, pamphlets, or other written materials from your doctor or pharmacy?: 1 -  Never  Diabetic?no         Activities of Daily Living In your present state of health, do you have any difficulty performing the following activities: 06/17/2020  Hearing? N  Vision? N  Difficulty concentrating or making decisions? N  Walking or climbing stairs? N  Dressing or bathing? N  Doing errands, shopping? N  Preparing Food and eating ? N  Using the Toilet? N  In the past six months, have you accidently leaked urine? N  Do you have problems with loss of bowel control? N  Managing your Medications? N  Managing your Finances? N  Housekeeping or managing your Housekeeping? N  Some recent data might be hidden    Patient Care Team: Gayland Curry, DO as PCP - General (Geriatric Medicine) Druscilla Brownie, MD as Consulting Physician (Dermatology) Garlan Fair, MD as Consulting Physician (Gastroenterology) Mosetta Anis, MD as Referring Physician (Allergy) Sueanne Margarita, MD as Consulting Physician (Cardiology)  Indicate any recent Medical Services you may have received from other than [facility] providers in the past year (date may be approximate).     Assessment:   This is a routine wellness examination for Traci Woodard.  Hearing/Vision screen  Hearing Screening   125Hz  250Hz  500Hz  1000Hz  2000Hz  3000Hz  4000Hz  6000Hz  8000Hz   Right ear:           Left ear:           Comments: Patient has no hearing problems  Vision Screening Comments: Patient wears glasses. No other vision problems. Patient has had her annual eye exam. She sees Dr. Ellie Lunch.  Dietary issues and exercise activities discussed: Current Exercise Habits: Home exercise routine, Type of exercise: stretching;calisthenics, Time (Minutes): 60, Frequency (Times/Week): 5, Weekly Exercise (Minutes/Week): 300  Goals      Exercise 5x per week (30 min per time)      Patient will start back up walking and doing classes.      Patient Stated (pt-stated)      I will start going to water aerobics 5 days a week       Depression Screen PHQ 2/9 Scores 06/17/2020 05/20/2020 04/21/2020 11/12/2019 09/03/2019 04/17/2019 01/08/2019  PHQ - 2 Score 0 0 0 0 0 0 0    Fall Risk Fall Risk  06/17/2020 05/20/2020 04/21/2020 03/10/2020 11/12/2019  Falls in the past year? 0 0 0 0 0  Number falls in past yr: 0 0 0 0 0  Injury with Fall? 0 0 0 0 0    FALL RISK PREVENTION PERTAINING TO THE HOME:  Any stairs in or around the home? No  If so, are there any without handrails? No  Home free of loose throw rugs in walkways, pet beds, electrical cords, etc? Yes  Adequate lighting in your home to reduce risk of falls? Yes   ASSISTIVE DEVICES UTILIZED TO PREVENT FALLS:  Life alert? No  Use of a cane, walker or w/c? No  Grab bars in the bathroom? Yes  Shower chair or bench in shower? no Elevated toilet seat or a handicapped toilet? No   TIMED UP AND GO:  Was the test performed? No .  na  Cognitive Function: MMSE - Mini Mental State Exam 02/26/2018 01/17/2017 02/16/2016  Orientation to time 5 5 5   Orientation to Place 5 5 5   Registration 3 3 3   Attention/ Calculation 5 5 5   Recall 3 2 3   Language- name 2 objects 2 2 2   Language- repeat 1 1 1   Language- follow 3 step command 3 3 3   Language- read & follow direction 1 1 1   Write a sentence 1 1 1   Copy design 1 1 1   Total score 30  29 30     6CIT Screen 06/17/2020 04/17/2019  What Year? 0 points 0 points  What month? 0 points 0 points  What time? 0 points 0 points  Count back from 20 0 points 0 points  Months in reverse 0 points 0 points  Repeat phrase 0 points 0 points  Total Score 0 0    Immunizations Immunization History  Administered Date(s) Administered   Influenza, High Dose Seasonal PF 01/18/2015, 03/28/2019, 04/16/2020   Influenza,inj,Quad PF,6+ Mos 04/24/2016, 04/12/2018   Influenza-Unspecified 04/09/2017   Moderna Sars-Covid-2 Vaccination 07/01/2019, 07/29/2019, 05/04/2020   Pneumococcal Conjugate-13 09/22/2013   Pneumococcal  Polysaccharide-23 09/02/2004, 09/18/2011   Tdap 09/18/2011   Zoster 08/19/2008   Zoster Recombinat (Shingrix) 08/21/2017, 11/12/2017    TDAP status: Up to date  Flu Vaccine status: Up to date  Pneumococcal vaccine status: Up to date  Covid-19 vaccine status: Completed vaccines  Qualifies for Shingles Vaccine? Yes   Zostavax completed Yes   Shingrix Completed?: Yes  Screening Tests Health Maintenance  Topic Date Due   MAMMOGRAM  11/26/2020   COLONOSCOPY (Pts 45-70yrs Insurance coverage will need to be confirmed)  12/03/2020   TETANUS/TDAP  09/17/2021   INFLUENZA VACCINE  Completed   DEXA SCAN  Completed   COVID-19 Vaccine  Completed   PNA vac Low Risk Adult  Completed    Health Maintenance  There are no preventive care reminders to display for this patient.  Colorectal cancer screening: No longer required.   Mammogram status: No longer required due to aged.  Bone Density status: Completed 11/2019. Results reflect: Bone density results: OSTEOPOROSIS. Repeat every 2 years.  Lung Cancer Screening: (Low Dose CT Chest recommended if Age 16-80 years, 30 pack-year currently smoking OR have quit w/in 15years.) does not qualify.   Lung Cancer Screening Referral:na  Additional Screening:  Hepatitis C Screening: does not qualify; Completed na  Vision Screening: Recommended annual ophthalmology exams for early detection of glaucoma and other disorders of the eye. Is the patient up to date with their annual eye exam?  Yes  Who is the provider or what is the name of the office in which the patient attends annual eye exams? [facility] If pt is not established with a provider, would they like to be referred to a provider to establish care? No .   Dental Screening: Recommended annual dental exams for proper oral hygiene  Community Resource Referral / Chronic Care Management: CRR required this visit?  No   CCM required this visit?  No      Plan:     I  have personally reviewed and noted the following in the patients chart:    Medical and social history  Use of alcohol, tobacco or illicit drugs   Current medications and supplements  Functional ability and status  Nutritional status  Physical activity  Advanced directives  List of other physicians  Hospitalizations, surgeries, and ER visits in previous 12 months  Vitals  Screenings to include cognitive, depression, and falls  Referrals and appointments  In addition, I have reviewed and discussed with patient certain preventive protocols, quality metrics, and best practice recommendations. A written personalized care plan for preventive services as well as general preventive health recommendations were provided to patient.     Sharon Seller, NP   06/17/2020    Virtual Visit via Telephone Note  I connected with@ on 06/17/20 at  8:30 AM EST by telephone and verified that I am speaking with the correct person  using two identifiers.  Location: Patient: home Provider: [facility]   I discussed the limitations, risks, security and privacy concerns of performing an evaluation and management service by telephone and the availability of in person appointments. I also discussed with the patient that there may be a patient responsible charge related to this service. The patient expressed understanding and agreed to proceed.   I discussed the assessment and treatment plan with the patient. The patient was provided an opportunity to ask questions and all were answered. The patient agreed with the plan and demonstrated an understanding of the instructions.   The patient was advised to call back or seek an in-person evaluation if the symptoms worsen or if the condition fails to improve as anticipated.  I provided 15 minutes of non-face-to-face time during this encounter.  [name]. Harle Battiest Avs printed and mailed

## 2020-08-09 ENCOUNTER — Encounter: Payer: Self-pay | Admitting: Internal Medicine

## 2020-09-02 LAB — COMPREHENSIVE METABOLIC PANEL: Calcium: 9.9 (ref 8.7–10.7)

## 2020-09-03 ENCOUNTER — Encounter: Payer: Self-pay | Admitting: *Deleted

## 2020-09-07 ENCOUNTER — Encounter: Payer: Self-pay | Admitting: Internal Medicine

## 2020-09-07 DIAGNOSIS — E785 Hyperlipidemia, unspecified: Secondary | ICD-10-CM | POA: Diagnosis not present

## 2020-09-07 DIAGNOSIS — I10 Essential (primary) hypertension: Secondary | ICD-10-CM | POA: Diagnosis not present

## 2020-09-07 LAB — BASIC METABOLIC PANEL
BUN: 17 (ref 4–21)
CO2: 22 (ref 13–22)
Chloride: 104 (ref 99–108)
Creatinine: 0.7 (ref 0.5–1.1)
Glucose: 100
Potassium: 4.1 (ref 3.4–5.3)
Sodium: 141 (ref 137–147)

## 2020-09-07 LAB — LIPID PANEL
Cholesterol: 245 — AB (ref 0–200)
HDL: 80 — AB (ref 35–70)
LDL Cholesterol: 147
Triglycerides: 87 (ref 40–160)

## 2020-09-08 ENCOUNTER — Ambulatory Visit: Payer: Self-pay | Admitting: Internal Medicine

## 2020-09-20 ENCOUNTER — Other Ambulatory Visit: Payer: Self-pay

## 2020-09-20 ENCOUNTER — Encounter: Payer: Self-pay | Admitting: Adult Health

## 2020-09-20 ENCOUNTER — Non-Acute Institutional Stay: Payer: Medicare PPO | Admitting: Adult Health

## 2020-09-20 VITALS — BP 120/68 | HR 68 | Temp 96.5°F | Ht 61.0 in | Wt 134.0 lb

## 2020-09-20 DIAGNOSIS — M810 Age-related osteoporosis without current pathological fracture: Secondary | ICD-10-CM | POA: Diagnosis not present

## 2020-09-20 DIAGNOSIS — M7122 Synovial cyst of popliteal space [Baker], left knee: Secondary | ICD-10-CM | POA: Diagnosis not present

## 2020-09-20 DIAGNOSIS — E7800 Pure hypercholesterolemia, unspecified: Secondary | ICD-10-CM | POA: Diagnosis not present

## 2020-09-20 DIAGNOSIS — Z636 Dependent relative needing care at home: Secondary | ICD-10-CM

## 2020-09-20 NOTE — Addendum Note (Signed)
Addended by: Barnie Mort on: 09/20/2020 02:06 PM   Modules accepted: Level of Service

## 2020-09-20 NOTE — Progress Notes (Signed)
Location:  [place]: [facility]  Provider:  Cindi Carbon, [place] [phone]   Code Status: DNR Goals of Care:  Advanced Directives 06/17/2020  Does Patient Have a Medical Advance Directive? Yes  Type of Paramedic of [place];Out of facility DNR (pink MOST or yellow form)  Does patient want to make changes to medical advance directive? No - Patient declined  Copy of [place] in Chart? Yes - validated most recent copy scanned in chart (See row information)  Pre-existing out of facility DNR order (yellow form or pink MOST form) -     Chief Complaint  Patient presents with  . Medical Management of Chronic Issues    Medical Management of Chronic Issues. 6 Month Follow up    HPI: Patient is a 80 y.o. female seen today for medical management of chronic diseases.    LDL 147 but HDL 80  Walks regularly but did have period of inactivity due to knee pain and bakers cysts which was drained in Nov of 2021. Swelling in left leg has improved and she would like to resume her physical activity.   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 She states she is handling this better and feels less stressed.    Past Medical History:  Diagnosis Date  . Allergy   . Ankle fracture, left   . Baker cyst 04/19/2020  . Bigeminy   . Cataract   . Colon polyps   . Diverticulosis   . FH: colon cancer   . Menopausal and perimenopausal disorder   . Multiple allergies   . Osteoporosis 2008   PER DEXA SCAN  . PVC (premature ventricular contraction)     Past Surgical History:  Procedure Laterality Date  . APPENDECTOMY    . CATARACT EXTRACTION W/ INTRAOCULAR LENS  IMPLANT, BILATERAL    . IR US GUIDE BX ASP/DRAIN  05/12/2020  . PERFORATED APPENDIX    . SMALL BOWEL OBSTRUCTION      Allergies  Allergen Reactions  . Pollen Extract      Outpatient Encounter Medications as of 09/20/2020  Medication Sig  . Calcium Carb-Ergocalciferol 500-200 MG-UNIT TABS Take 2 tablets by mouth daily.   . Cholecalciferol (VITAMIN D3) 2000 units capsule Take 2,000 Units by mouth every other day.  . denosumab (PROLIA) 60 MG/ML SOLN injection Inject 60 mg into the skin every 6 (six) months. Administer in upper arm, thigh, or abdomen  . NON FORMULARY Antronex dietary supplement 1 per day  . NON FORMULARY Seneplex take 1 tablet once daily  . NON FORMULARY Bio-C-Plus, take tablet by mouth once daily  . vitamin B-12 (CYANOCOBALAMIN) 1000 MCG tablet Take 1,000 mcg by mouth daily.   No facility-administered encounter medications on file as of 09/20/2020.    Review of Systems:  Review of Systems  Constitutional: Negative for activity change, appetite change, chills, diaphoresis, fatigue, fever and unexpected weight change.  HENT: Negative for congestion and trouble swallowing.   Respiratory: Negative for cough, shortness of breath and wheezing.   Cardiovascular: Positive for leg swelling (left ankle ). Negative for chest pain and palpitations.  Gastrointestinal: Negative for abdominal distention, abdominal pain, constipation and diarrhea.  Genitourinary: Negative for difficulty urinating and dysuria.  Musculoskeletal: Negative for arthralgias, back pain, gait problem, joint swelling and myalgias.  Skin: Negative for wound.  Neurological: Negative for dizziness, tremors, seizures, syncope, facial  asymmetry, speech difficulty, weakness, light-headedness, numbness and headaches.  Psychiatric/Behavioral: Negative for agitation, behavioral problems, confusion and sleep disturbance. The patient is not nervous/anxious.     Health Maintenance  Topic Date Due  . MAMMOGRAM  11/26/2020  . COLONOSCOPY (Pts 45-30yrs Insurance coverage will need to be confirmed)  12/03/2020  . INFLUENZA VACCINE  01/17/2021  . TETANUS/TDAP  09/17/2021  . DEXA SCAN   Completed  . COVID-19 Vaccine  Completed  . PNA vac Low Risk Adult  Completed  . HPV VACCINES  Aged Out    Physical Exam: Vitals:   09/20/20 1305  BP: 120/68  Pulse: 68  Temp: (!) 96.5 F (35.8 C)  TempSrc: Skin  SpO2: 98%  Weight: 134 lb (60.8 kg)  Height: 5\' 1"  (1.549 m)   Body mass index is 25.32 kg/m. Physical Exam Vitals and nursing note reviewed.  Constitutional:      General: She is not in acute distress.    Appearance: She is not diaphoretic.  HENT:     Head: Normocephalic and atraumatic.     Right Ear: Tympanic membrane and ear canal normal.     Left Ear: Tympanic membrane and ear canal normal.     Nose: Nose normal. No congestion.     Mouth/Throat:     Mouth: Mucous membranes are moist.     Pharynx: Oropharynx is clear.  Neck:     Vascular: No JVD.  Cardiovascular:     Rate and Rhythm: Normal rate and regular rhythm.     Heart sounds: No murmur heard.   Pulmonary:     Effort: Pulmonary effort is normal. No respiratory distress.     Breath sounds: Normal breath sounds. No wheezing.  Abdominal:     General: Bowel sounds are normal. There is no distension.     Palpations: Abdomen is soft.  Musculoskeletal:     Cervical back: No rigidity or tenderness.     Right lower leg: No edema.     Left lower leg: Edema (trace ankle) present.     Comments: Mild edema to the left inner aspect of the knee with no pain on range of motion or erythema  Lymphadenopathy:     Cervical: No cervical adenopathy.  Skin:    General: Skin is warm and dry.  Neurological:     Mental Status: She is alert and oriented to person, place, and time.  Psychiatric:        Mood and Affect: Mood normal.     Labs reviewed: Basic Metabolic Panel: Recent Labs    09/02/20 0000  NA 141  K 4.1  CL 104  CO2 22  BUN 17  CREATININE 0.7  CALCIUM 9.9   Liver Function Tests: No results for input(s): AST, ALT, ALKPHOS, BILITOT, PROT, ALBUMIN in the last 8760 hours. No results for  input(s): LIPASE, AMYLASE in the last 8760 hours. No results for input(s): AMMONIA in the last 8760 hours. CBC: No results for input(s): WBC, NEUTROABS, HGB, HCT, MCV, PLT in the last 8760 hours. Lipid Panel: Recent Labs    02/26/20 0000 09/02/20 0000  CHOL  --  245*  HDL 79* 80*  LDLCALC 130 147  TRIG 71 87   Lab Results  Component Value Date   HGBA1C 5.4 10/29/2014    Procedures since last visit: No results found.  Assessment/Plan  1. Senile osteoporosis Continue Prolia inj q 6 months Continue Ca and Vit D supplementation   2. Baker's cyst of knee, left Improved  swelling with no further pain   3. Pure hypercholesterolemia LDL elevated but HDL is good She will be resuming more activity and will have a f/u lipid panel in 6 months  4. Caregiver stress She is feeling better about taking care of her husband and is aware of the different support systems in place.    May resume physical activities If worsening swelling and pain the left lower ext notify [facility] due to history of baker cyst F/U in 6 months  She is going to continue to get mammograms (due in June)  Labs/tests ordered: CBC and lipid panel  Next appt:  12/13/2020 pro[facility]

## 2020-09-20 NOTE — Patient Instructions (Signed)
May resume physical activities If worsening swelling and pain the left lower ext notify [facility] due to history of baker cyst F/U in 6 months

## 2020-10-19 ENCOUNTER — Other Ambulatory Visit: Payer: Self-pay

## 2020-10-19 ENCOUNTER — Other Ambulatory Visit (HOSPITAL_BASED_OUTPATIENT_CLINIC_OR_DEPARTMENT_OTHER): Payer: Self-pay

## 2020-10-19 ENCOUNTER — Ambulatory Visit: Payer: Medicare PPO | Attending: Internal Medicine

## 2020-10-19 DIAGNOSIS — Z23 Encounter for immunization: Secondary | ICD-10-CM

## 2020-10-19 MED ORDER — COVID-19 MRNA VACC (MODERNA) 100 MCG/0.5ML IM SUSP
INTRAMUSCULAR | 0 refills | Status: DC
  Filled 2020-10-19: qty 0.25, 1d supply, fill #0

## 2020-10-19 NOTE — Progress Notes (Signed)
   Covid-19 Vaccination Clinic  Name:  Traci Woodard    MRN: [id number] DOB: 11/18/1940  10/19/2020  Traci Woodard was observed post Covid-19 immunization for 15 minutes without incident. She was provided with Vaccine Information Sheet and instruction to access the V-Safe system.   Traci Woodard was instructed to call 911 with any severe reactions post vaccine: Marland Kitchen Difficulty breathing  . Swelling of face and throat  . A fast heartbeat  . A bad rash all over body  . Dizziness and weakness   Immunizations Administered    Name Date Dose VIS Date Route   Moderna Covid-19 Booster Vaccine 10/19/2020 11:42 AM 0.25 mL 04/07/2020 Intramuscular   Manufacturer: Moderna   Lot: [id number]   [place]: [phone]

## 2020-10-25 ENCOUNTER — Other Ambulatory Visit: Payer: Self-pay | Admitting: Internal Medicine

## 2020-10-25 DIAGNOSIS — Z1231 Encounter for screening mammogram for malignant neoplasm of breast: Secondary | ICD-10-CM

## 2020-12-10 ENCOUNTER — Ambulatory Visit (INDEPENDENT_AMBULATORY_CARE_PROVIDER_SITE_OTHER): Payer: Medicare PPO | Admitting: Nurse Practitioner

## 2020-12-10 ENCOUNTER — Other Ambulatory Visit: Payer: Self-pay

## 2020-12-10 ENCOUNTER — Encounter: Payer: Self-pay | Admitting: Nurse Practitioner

## 2020-12-10 VITALS — BP 122/78 | HR 64 | Temp 97.5°F | Ht 61.0 in | Wt 134.0 lb

## 2020-12-10 DIAGNOSIS — M810 Age-related osteoporosis without current pathological fracture: Secondary | ICD-10-CM

## 2020-12-10 DIAGNOSIS — R2242 Localized swelling, mass and lump, left lower limb: Secondary | ICD-10-CM | POA: Diagnosis not present

## 2020-12-10 DIAGNOSIS — M26623 Arthralgia of bilateral temporomandibular joint: Secondary | ICD-10-CM | POA: Diagnosis not present

## 2020-12-10 NOTE — Progress Notes (Signed)
Careteam: Patient Care Team: Virgie Dad, MD as PCP - General (Internal Medicine) Druscilla Brownie, MD as Consulting Physician (Dermatology) Garlan Fair, MD as Consulting Physician (Gastroenterology) Mosetta Anis, MD as Referring Physician (Allergy) Sueanne Margarita, MD as Consulting Physician (Cardiology)  PLACE OF SERVICE:  [place]  Advanced Directive information    Allergies  Allergen Reactions   Pollen Extract     Chief Complaint  Patient presents with   Acute Visit    Discuss jaw issue, x 2-3 weeks patient unable to open mouth as wide and it makes a noise. Left leg swelling x 1 year, patient elevates and previously used compression hose (ineffective). Patient wraps cabbage around left leg at night and it helps with the swelling.      HPI: Patient is a 80 y.o. female due to jaw issues.  Reports 3 weeks ago she was taking a big bite of something and could not open her mouth all the way.  Making a noise when she opens her mouth it makes a noise.  She is aware of it frequently- not consisently. Mostly right and sometimes the left as well.  No swelling ,redness, no fever.  No trouble swallowing.  Reports it is getting worse.  Goes to dentist regularly, no major dental work needed at this time.  She did have neclect for a while but has been good for the last 20 years.   Left leg swelling for 1 year. Tries elevating, compresses. Knee has been effected by it. Goes to chiropractor.  Broke left ankle in the past.  In 2021 it was found to have baker cyst, this was drained and pain improved. Currently no pain just swelling.   Review of Systems:  Review of Systems  Constitutional:  Negative for chills, fever and weight loss.  HENT:  Negative for tinnitus.   Respiratory:  Negative for cough, sputum production and shortness of breath.   Cardiovascular:  Positive for leg swelling. Negative for chest pain and palpitations.  Gastrointestinal:  Negative for  abdominal pain, constipation, diarrhea and heartburn.  Genitourinary:  Negative for dysuria, frequency and urgency.  Musculoskeletal:  Positive for joint pain. Negative for back pain, falls and myalgias.  Skin: Negative.   Neurological:  Negative for dizziness and headaches.  Psychiatric/Behavioral:  Negative for depression and memory loss. The patient does not have insomnia.    Past Medical History:  Diagnosis Date   Allergy    Ankle fracture, left    Baker cyst 04/19/2020   Bigeminy    Cataract    Colon polyps    Diverticulosis    FH: colon cancer    Menopausal and perimenopausal disorder    Multiple allergies    Osteoporosis 2008   PER DEXA SCAN   PVC (premature ventricular contraction)    Past Surgical History:  Procedure Laterality Date   APPENDECTOMY     CATARACT EXTRACTION W/ INTRAOCULAR LENS  IMPLANT, BILATERAL     IR US GUIDE BX ASP/DRAIN  05/12/2020   PERFORATED APPENDIX     SMALL BOWEL OBSTRUCTION     Social History:   reports that she has never smoked. She has never used smokeless tobacco. She reports current alcohol use. She reports that she does not use drugs.  Family History  Problem Relation Age of Onset   Colon cancer Mother    CAD Father    Dementia Father    Heart disease Father    Colon cancer Brother  Breast cancer Paternal Aunt     Medications: Patient's Medications  New Prescriptions   No medications on file  Previous Medications   CALCIUM CARB-ERGOCALCIFEROL 500-200 MG-UNIT TABS    Take 2 tablets by mouth daily.    CHOLECALCIFEROL (VITAMIN D3) 2000 UNITS CAPSULE    Take 2,000 Units by mouth every other day.   DENOSUMAB (PROLIA) 60 MG/ML SOLN INJECTION    Inject 60 mg into the skin every 6 (six) months. Administer in upper arm, thigh, or abdomen   NON FORMULARY    Antronex dietary supplement 1 per day   NON FORMULARY    Seneplex take 1 tablet once daily   NON FORMULARY    Bio-C-Plus, take tablet by mouth once daily   VITAMIN B-12  (CYANOCOBALAMIN) 1000 MCG TABLET    Take 1,000 mcg by mouth daily.  Modified Medications   No medications on file  Discontinued Medications   COVID-19 MRNA VACCINE, MODERNA, 100 MCG/0.5ML INJECTION    Inject into the muscle.    Physical Exam:  Vitals:   12/10/20 1031  BP: 122/78  Pulse: 64  Temp: (!) 97.5 F (36.4 C)  SpO2: 98%  Weight: 134 lb (60.8 kg)  Height: 5\' 1"  (1.549 m)   Body mass index is 25.32 kg/m. Wt Readings from Last 3 Encounters:  12/10/20 134 lb (60.8 kg)  09/20/20 134 lb (60.8 kg)  05/20/20 130 lb (59 kg)    Physical Exam Constitutional:      General: She is not in acute distress.    Appearance: She is well-developed. She is not diaphoretic.  HENT:     Head: Normocephalic and atraumatic.     Jaw: Pain on movement (mild) present. No trismus, tenderness or swelling.     Comments: Crepitus noted to TMJ    Mouth/Throat:     Pharynx: No oropharyngeal exudate.  Eyes:     Conjunctiva/sclera: Conjunctivae normal.     Pupils: Pupils are equal, round, and reactive to light.  Cardiovascular:     Rate and Rhythm: Normal rate and regular rhythm.     Heart sounds: Normal heart sounds.  Pulmonary:     Effort: Pulmonary effort is normal.     Breath sounds: Normal breath sounds.  Abdominal:     General: Bowel sounds are normal.     Palpations: Abdomen is soft.  Musculoskeletal:     Cervical back: Normal range of motion and neck supple.     Right lower leg: No edema.     Left lower leg: Edema present.  Skin:    General: Skin is warm and dry.  Neurological:     Mental Status: She is alert.  Psychiatric:        Mood and Affect: Mood normal.    Labs reviewed: Basic Metabolic Panel: Recent Labs    09/02/20 0000 09/07/20 0000  NA  --  141  K  --  4.1  CL  --  104  CO2  --  22  BUN  --  17  CREATININE  --  0.7  CALCIUM 9.9  --    Liver Function Tests: No results for input(s): AST, ALT, ALKPHOS, BILITOT, PROT, ALBUMIN in the last 8760 hours. No  results for input(s): LIPASE, AMYLASE in the last 8760 hours. No results for input(s): AMMONIA in the last 8760 hours. CBC: No results for input(s): WBC, NEUTROABS, HGB, HCT, MCV, PLT in the last 8760 hours. Lipid Panel: Recent Labs    02/26/20 0000 09/07/20 0000  CHOL  --  245*  HDL 79* 80*  LDLCALC 130 147  TRIG 71 87   TSH: No results for input(s): TSH in the last 8760 hours. A1C: Lab Results  Component Value Date   HGBA1C 5.4 10/29/2014     Assessment/Plan 1. Senile osteoporosis -plans to have prolia next week.   2. Bilateral temporomandibular joint pain -worsening symptoms of crepitus over the last 2 weeks with slight discomfort but reports no increase in pain. Encouraged lifestyle modifications at this time.   3. Localized swelling of left lower leg This has been stable. Without increase in edema. No pain or redness noted.    Next appt: 12/13/2020 as scheduled.  [name]. [place], [facility] [phone]

## 2020-12-13 ENCOUNTER — Ambulatory Visit (INDEPENDENT_AMBULATORY_CARE_PROVIDER_SITE_OTHER): Payer: Medicare PPO | Admitting: *Deleted

## 2020-12-13 ENCOUNTER — Other Ambulatory Visit: Payer: Self-pay

## 2020-12-13 DIAGNOSIS — M810 Age-related osteoporosis without current pathological fracture: Secondary | ICD-10-CM

## 2020-12-13 MED ORDER — DENOSUMAB 60 MG/ML ~~LOC~~ SOSY
60.0000 mg | PREFILLED_SYRINGE | Freq: Once | SUBCUTANEOUS | Status: AC
  Administered 2020-12-13: 60 mg via SUBCUTANEOUS

## 2020-12-14 LAB — LIPID PANEL
Cholesterol: 239 — AB (ref 0–200)
HDL: 78 — AB (ref 35–70)
LDL Cholesterol: 149
LDl/HDL Ratio: 3.1
Triglycerides: 59 (ref 40–160)

## 2020-12-14 LAB — CBC AND DIFFERENTIAL
HCT: 38 (ref 36–46)
Hemoglobin: 12.8 (ref 12.0–16.0)
Platelets: 178 (ref 150–399)
WBC: 3.4

## 2020-12-14 LAB — CBC: RBC: 4.2 (ref 3.87–5.11)

## 2020-12-17 ENCOUNTER — Non-Acute Institutional Stay: Payer: Medicare PPO | Admitting: Internal Medicine

## 2020-12-17 ENCOUNTER — Other Ambulatory Visit: Payer: Self-pay

## 2020-12-17 ENCOUNTER — Encounter: Payer: Self-pay | Admitting: Internal Medicine

## 2020-12-17 VITALS — BP 136/92 | HR 70 | Temp 98.1°F | Ht 61.0 in | Wt 134.8 lb

## 2020-12-17 DIAGNOSIS — M1712 Unilateral primary osteoarthritis, left knee: Secondary | ICD-10-CM

## 2020-12-17 DIAGNOSIS — R2242 Localized swelling, mass and lump, left lower limb: Secondary | ICD-10-CM | POA: Diagnosis not present

## 2020-12-17 DIAGNOSIS — M810 Age-related osteoporosis without current pathological fracture: Secondary | ICD-10-CM | POA: Diagnosis not present

## 2020-12-17 DIAGNOSIS — E7800 Pure hypercholesterolemia, unspecified: Secondary | ICD-10-CM | POA: Diagnosis not present

## 2020-12-17 DIAGNOSIS — M26623 Arthralgia of bilateral temporomandibular joint: Secondary | ICD-10-CM

## 2020-12-17 NOTE — Progress Notes (Signed)
Location:  [place] of Service:  Clinic (12)  Provider:   Code Status:  Goals of Care:  Advanced Directives 12/17/2020  Does Patient Have a Medical Advance Directive? Yes  Type of Paramedic of [place];Living will;Out of facility DNR (pink MOST or yellow form)  Does patient want to make changes to medical advance directive? No - Patient declined  Copy of [place] in Chart? Yes - validated most recent copy scanned in chart (See row information)  Pre-existing out of facility DNR order (yellow form or pink MOST form) Yellow form placed in chart (order not valid for inpatient use)     Chief Complaint  Patient presents with   Medical Management of Chronic Issues    Patient returns to the clinic follow up.     HPI: Patient is a 80 y.o. female seen today for medical management of chronic diseases.    Patient has h/o Osteoporosis ON Prolia Left Lower leg Swelling since 05/21. [place] was negative Doe shave h/o Left Ankle Fracture in 2009 H/o Previous Left knee Bakers Cyst TMJ arthritis  Has seen Chiropractor for Lower leg swelling nothing Helps Wrapping helps but  Then swelling comes back Ted hoses were not right size as were causing her to have pain   Also has noticed Crepitus in her TMJ joint No pain or discomfort. No Issue with Chewing Was seen by Dentis recently with no issue with Teeth  Lives with her husband Still drive No Falls. Walks and Exercise  Past Medical History:  Diagnosis Date   Allergy    Ankle fracture, left    Baker cyst 04/19/2020   Bigeminy    Cataract    Colon polyps    Diverticulosis    FH: colon cancer    Menopausal and perimenopausal disorder    Multiple allergies    Osteoporosis 2008   PER DEXA SCAN   PVC (premature ventricular contraction)     Past Surgical History:  Procedure Laterality Date   APPENDECTOMY     CATARACT EXTRACTION W/ INTRAOCULAR LENS  IMPLANT,  BILATERAL     IR US GUIDE BX ASP/DRAIN  05/12/2020   PERFORATED APPENDIX     SMALL BOWEL OBSTRUCTION      Allergies  Allergen Reactions   Pollen Extract     Outpatient Encounter Medications as of 12/17/2020  Medication Sig   Calcium Carb-Ergocalciferol 500-200 MG-UNIT TABS Take 2 tablets by mouth daily.    Cholecalciferol (VITAMIN D3) 2000 units capsule Take 2,000 Units by mouth daily.   denosumab (PROLIA) 60 MG/ML SOLN injection Inject 60 mg into the skin every 6 (six) months. Administer in upper arm, thigh, or abdomen   NON FORMULARY Antronex dietary supplement 1 per day   NON FORMULARY Seneplex take 1 tablet once daily   NON FORMULARY Bio-C-Plus, take tablet by mouth once daily   vitamin B-12 (CYANOCOBALAMIN) 1000 MCG tablet Take 1,000 mcg by mouth daily.   No facility-administered encounter medications on file as of 12/17/2020.    Review of Systems:  Review of Systems Review of Systems  Constitutional: Negative for activity change, appetite change, chills, diaphoresis, fatigue and fever.  HENT: Negative for mouth sores, postnasal drip, rhinorrhea, sinus pain and sore throat.   Respiratory: Negative for apnea, cough, chest tightness, shortness of breath and wheezing.   Cardiovascular: Negative for chest pain, palpitations and leg swelling.  Gastrointestinal: Negative for abdominal distention, abdominal pain, constipation, diarrhea, nausea and vomiting.  Genitourinary: Negative for dysuria and frequency.  Musculoskeletal: Negative for arthralgias, joint swelling and myalgias.  Skin: Negative for rash.  Neurological: Negative for dizziness, syncope, weakness, light-headedness and numbness.  Psychiatric/Behavioral: Negative for behavioral problems, confusion and sleep disturbance.   Health Maintenance  Topic Date Due   MAMMOGRAM  11/26/2020   COLONOSCOPY (Pts 45-68yrs Insurance coverage will need to be confirmed)  12/03/2020   INFLUENZA VACCINE  01/17/2021   TETANUS/TDAP   09/17/2021   DEXA SCAN  Completed   COVID-19 Vaccine  Completed   PNA vac Low Risk Adult  Completed   Zoster Vaccines- Shingrix  Completed   HPV VACCINES  Aged Out    Physical Exam: Vitals:   12/17/20 0912  BP: (!) 136/92  Pulse: 70  Temp: 98.1 F (36.7 C)  SpO2: 98%  Weight: 134 lb 12.8 oz (61.1 kg)  Height: 5\' 1"  (1.549 m)   Body mass index is 25.47 kg/m. Physical Exam Constitutional: Oriented to person, place, and time. Well-developed and well-nourished.  HENT:  Head: Normocephalic.  TM Normal Mouth/Throat: Oropharynx is clear and moist.  Eyes: Pupils are equal, round, and reactive to light.  Neck: Neck supple.  Crepitus in TMJ noise Cardiovascular: Normal rate and normal heart sounds.  No murmur heard. Pulmonary/Chest: Effort normal and breath sounds normal. No respiratory distress. No wheezes. She has no rales.  Abdominal: Soft. Bowel sounds are normal. No distension. There is no tenderness. There is no rebound.  Musculoskeletal: Non Pitting Edema in Her Left Leg No Redness Knee joint  has some selling due to Arthritis Lymphadenopathy: none Neurological: Alert and oriented to person, place, and time. Gait is good  Skin: Skin is warm and dry.  Psychiatric: Normal mood and affect. Behavior is normal. Thought content normal.   Labs reviewed: Basic Metabolic Panel: Recent Labs    09/02/20 0000 09/07/20 0000  NA  --  141  K  --  4.1  CL  --  104  CO2  --  22  BUN  --  17  CREATININE  --  0.7  CALCIUM 9.9  --    Liver Function Tests: No results for input(s): AST, ALT, ALKPHOS, BILITOT, PROT, ALBUMIN in the last 8760 hours. No results for input(s): LIPASE, AMYLASE in the last 8760 hours. No results for input(s): AMMONIA in the last 8760 hours. CBC: Recent Labs    12/14/20 0000  WBC 3.4  HGB 12.8  HCT 38  PLT 178   Lipid Panel: Recent Labs    02/26/20 0000 09/07/20 0000 12/14/20 0000  CHOL  --  245* 239*  HDL 79* 80* 78*  LDLCALC 130 147 149   TRIG 71 87 59   Lab Results  Component Value Date   HGBA1C 5.4 10/29/2014    Procedures since last visit: No results found.  Assessment/Plan 1. Senile osteoporosis On Prolia DEXA done in 2021 - CBC with Differential/Platelet; Future - COMPLETE METABOLIC PANEL WITH GFR; Future - TSH; Future - Vitamin D, 25-hydroxy; Future  2. Bilateral temporomandibular joint pain Will Order Xray of TMJ to evaluate further  3. Localized swelling of left lower leg ? Lymphedema Compression stockings Will have them eval by Facility Nurse for the Size Does have Left Knee arthritis and h/o Bakers Cyst  4. Pure hypercholesterolemia HDL is good level. LDL has been going up  D/w Patient to try some Exercise and Diet modification  - Lipid panel; Future  5. Arthritis of left knee Doing well.    Labs/tests  ordered:   Next appt:  Visit date not found

## 2020-12-21 ENCOUNTER — Other Ambulatory Visit: Payer: Self-pay

## 2020-12-21 ENCOUNTER — Ambulatory Visit
Admission: RE | Admit: 2020-12-21 | Discharge: 2020-12-21 | Disposition: A | Discharge status: Home / Self Care | Payer: Medicare PPO | Source: Ambulatory Visit | Attending: Internal Medicine | Admitting: Internal Medicine

## 2020-12-21 DIAGNOSIS — Z1231 Encounter for screening mammogram for malignant neoplasm of breast: Secondary | ICD-10-CM

## 2020-12-24 ENCOUNTER — Other Ambulatory Visit: Payer: Self-pay | Admitting: Internal Medicine

## 2020-12-24 DIAGNOSIS — R928 Other abnormal and inconclusive findings on diagnostic imaging of breast: Secondary | ICD-10-CM

## 2021-01-13 ENCOUNTER — Ambulatory Visit
Admission: RE | Admit: 2021-01-13 | Discharge: 2021-01-13 | Disposition: A | Discharge status: Home / Self Care | Payer: Medicare PPO | Source: Ambulatory Visit | Attending: Internal Medicine | Admitting: Internal Medicine

## 2021-01-13 ENCOUNTER — Other Ambulatory Visit: Payer: Self-pay

## 2021-01-13 ENCOUNTER — Ambulatory Visit: Payer: Medicare PPO

## 2021-01-13 DIAGNOSIS — R928 Other abnormal and inconclusive findings on diagnostic imaging of breast: Secondary | ICD-10-CM

## 2021-01-14 ENCOUNTER — Encounter: Payer: Self-pay | Admitting: Family

## 2021-01-17 ENCOUNTER — Other Ambulatory Visit: Payer: Self-pay

## 2021-01-17 ENCOUNTER — Encounter: Payer: Self-pay | Admitting: Family

## 2021-01-17 ENCOUNTER — Ambulatory Visit (INDEPENDENT_AMBULATORY_CARE_PROVIDER_SITE_OTHER): Payer: Medicare PPO | Admitting: Family

## 2021-01-17 VITALS — BP 140/98 | HR 66 | Temp 96.9°F | Resp 16 | Ht 61.0 in | Wt 133.0 lb

## 2021-01-17 DIAGNOSIS — I10 Essential (primary) hypertension: Secondary | ICD-10-CM

## 2021-01-17 MED ORDER — LOSARTAN POTASSIUM 25 MG PO TABS: 25.0000 mg | ORAL_TABLET | Freq: Every day | ORAL | 0 refills | Status: DC

## 2021-01-17 NOTE — Patient Instructions (Signed)
Hypertension, Adult High blood pressure (hypertension) is when the force of blood pumping through the arteries is too strong. The arteries are the blood vessels that carry blood from the heart throughout the body. Hypertension forces the heart to work harder to pump blood and may cause arteries to become narrow or stiff. Untreated or uncontrolled hypertension can cause a heart attack, heart failure, a stroke, kidney disease, and other problems. A blood pressure reading consists of a higher number over a lower number. Ideally, your blood pressure should be below 120/80. The first ("top") number is called the systolic pressure. It is a measure of the pressure in your arteries as your heart beats. The second ("bottom") number is called the diastolic pressure. It is a measure of the pressure in your arteries as the heart relaxes. What are the causes? The exact cause of this condition is not known. There are some conditions that result in or are related to high blood pressure. What increases the risk? Some risk factors for high blood pressure are under your control. The following factors may make you more likely to develop this condition: Smoking. Having type 2 diabetes mellitus, high cholesterol, or both. Not getting enough exercise or physical activity. Being overweight. Having too much fat, sugar, calories, or salt (sodium) in your diet. Drinking too much alcohol. Some risk factors for high blood pressure may be difficult or impossible to change. Some of these factors include: Having chronic kidney disease. Having a family history of high blood pressure. Age. Risk increases with age. Race. You may be at higher risk if you are African American. Gender. Men are at higher risk than women before age 45. After age 65, women are at higher risk than men. Having obstructive sleep apnea. Stress. What are the signs or symptoms? High blood pressure may not cause symptoms. Very high blood pressure  (hypertensive crisis) may cause: Headache. Anxiety. Shortness of breath. Nosebleed. Nausea and vomiting. Vision changes. Severe chest pain. Seizures. How is this diagnosed? This condition is diagnosed by measuring your blood pressure while you are seated, with your arm resting on a flat surface, your legs uncrossed, and your feet flat on the floor. The cuff of the blood pressure monitor will be placed directly against the skin of your upper arm at the level of your heart. It should be measured at least twice using the same arm. Certain conditions can cause a difference in blood pressure between your right and left arms. Certain factors can cause blood pressure readings to be lower or higher than normal for a short period of time: When your blood pressure is higher when you are in a health care provider's office than when you are at home, this is called white coat hypertension. Most people with this condition do not need medicines. When your blood pressure is higher at home than when you are in a health care provider's office, this is called masked hypertension. Most people with this condition may need medicines to control blood pressure. If you have a high blood pressure reading during one visit or you have normal blood pressure with other risk factors, you may be asked to: Return on a different day to have your blood pressure checked again. Monitor your blood pressure at home for 1 week or longer. If you are diagnosed with hypertension, you may have other blood or imaging tests to help your health care provider understand your overall risk for other conditions. How is this treated? This condition is treated by making   eating healthy foods, exercising more, and reducing your alcohol intake. Your health care provider may prescribe medicine if lifestyle changes are not enough to get your blood pressure under control, and if: Your systolic blood pressure is above 130. Your  diastolic blood pressure is above 80. Your personal target blood pressure may vary depending on your medicalconditions, your age, and other factors. Follow these instructions at home: Eating and drinking  Eat a diet that is high in fiber and potassium, and low in sodium, added sugar, and fat. An example eating plan is called the DASH (Dietary Approaches to Stop Hypertension) diet. To eat this way: Eat plenty of fresh fruits and vegetables. Try to fill one half of your plate at each meal with fruits and vegetables. Eat whole grains, such as whole-wheat pasta, brown rice, or whole-grain bread. Fill about one fourth of your plate with whole grains. Eat or drink low-fat dairy products, such as skim milk or low-fat yogurt. Avoid fatty cuts of meat, processed or cured meats, and poultry with skin. Fill about one fourth of your plate with lean proteins, such as fish, chicken without skin, beans, eggs, or tofu. Avoid pre-made and processed foods. These tend to be higher in sodium, added sugar, and fat. Reduce your daily sodium intake. Most people with hypertension should eat less than 1,500 mg of sodium a day. Do not drink alcohol if: Your health care provider tells you not to drink. You are pregnant, may be pregnant, or are planning to become pregnant. If you drink alcohol: Limit how much you use to: 0-1 drink a day for women. 0-2 drinks a day for men. Be aware of how much alcohol is in your drink. In the [place] one drink equals one 12 oz bottle of beer (355 mL), one 5 oz glass of wine (148 mL), or one 1 oz glass of hard liquor (44 mL).  Lifestyle  Work with your health care provider to maintain a healthy body weight or to lose weight. Ask what an ideal weight is for you. Get at least 30 minutes of exercise most days of the week. Activities may include walking, swimming, or biking. Include exercise to strengthen your muscles (resistance exercise), such as Pilates or lifting weights, as part of your  weekly exercise routine. Try to do these types of exercises for 30 minutes at least 3 days a week. Do not use any products that contain nicotine or tobacco, such as cigarettes, e-cigarettes, and chewing tobacco. If you need help quitting, ask your health care provider. Monitor your blood pressure at home as told by your health care provider. Keep all follow-up visits as told by your health care provider. This is important.  Medicines Take over-the-counter and prescription medicines only as told by your health care provider. Follow directions carefully. Blood pressure medicines must be taken as prescribed. Do not skip doses of blood pressure medicine. Doing this puts you at risk for problems and can make the medicine less effective. Ask your health care provider about side effects or reactions to medicines that you should watch for. Contact a health care provider if you: Think you are having a reaction to a medicine you are taking. Have headaches that keep coming back (recurring). Feel dizzy. Have swelling in your ankles. Have trouble with your vision. Get help right away if you: Develop a severe headache or confusion. Have unusual weakness or numbness. Feel faint. Have severe pain in your chest or abdomen. Vomit repeatedly. Have trouble breathing. Summary Hypertension is  when the force of blood pumping through your arteries is too strong. If this condition is not controlled, it may put you at risk for serious complications. Your personal target blood pressure may vary depending on your medical conditions, your age, and other factors. For most people, a normal blood pressure is less than 120/80. Hypertension is treated with lifestyle changes, medicines, or a combination of both. Lifestyle changes include losing weight, eating a healthy, low-sodium diet, exercising more, and limiting alcohol. This information is not intended to replace advice given to you by your health care provider. Make  sure you discuss any questions you have with your healthcare provider. Document Revised: 02/13/2018 Document Reviewed: 02/13/2018 Elsevier Patient Education  [place].  Losartan Tablets What is this medication? LOSARTAN (loe SAR tan) treats high blood pressure. It may also be used to prevent a stroke in people with heart disease and high blood pressure. It can be used to prevent kidney damage in people with diabetes. It works by relaxing the blood vessels, which helps decrease the amount of work your heart has todo. It belongs to a group of medications called ARBs. This medicine may be used for other purposes; ask your health care provider orpharmacist if you have questions. COMMON BRAND NAME(S): Cozaar What should I tell my care team before I take this medication? They need to know if you have any of these conditions: Heart failure Kidney disease Liver disease An unusual or allergic reaction to losartan, other medications, foods, dyes, or preservatives Pregnant or trying to get pregnant Breast-feeding How should I use this medication? Take this medication by mouth. Take it as directed on the prescription label at the same time every day. You can take it with or without food. If it upsets your stomach, take it with food. Keep taking it unless your care team tells youto stop. Talk to your care team about the use of this medication in children. While it may be prescribed for children as young as 6 for selected conditions,precautions do apply. Overdosage: If you think you have taken too much of this medicine contact apoison control center or emergency room at once. NOTE: This medicine is only for you. Do not share this medicine with others. What if I miss a dose? If you miss a dose, take it as soon as you can. If it is almost time for yournext dose, take only that dose. Do not take double or extra doses. What may interact with this medication? Aliskiren ACE inhibitors, like enalapril  or lisinopril Diuretics, especially amiloride, eplerenone, spironolactone, or triamterene Lithium NSAIDs, medications for pain and inflammation, like ibuprofen or naproxen Potassium salts or potassium supplements This list may not describe all possible interactions. Give your health care provider a list of all the medicines, herbs, non-prescription drugs, or dietary supplements you use. Also tell them if you smoke, drink alcohol, or use illegaldrugs. Some items may interact with your medicine. What should I watch for while using this medication? Visit your care team for regular check ups. Check your blood pressure as directed. Ask your care team what your blood pressure should be. Also, find outwhen you should contact them. Do not treat yourself for coughs, colds, or pain while you are using this medication without asking your care team for advice. Some medications mayincrease your blood pressure. Women should inform their care team if they wish to become pregnant or think they might be pregnant. There is a potential for serious side effects to anunborn child. Talk  to your care team for more information. You may get drowsy or dizzy. Do not drive, use machinery, or do anything that needs mental alertness until you know how this medication affects you. Do not stand or sit up quickly, especially if you are an older patient. This reduces the risk of dizzy or fainting spells. Alcohol can make you more drowsy anddizzy. Avoid alcoholic drinks. Avoid salt substitutes unless you are told otherwise by your care team. What side effects may I notice from receiving this medication? Side effects that you should report to your care team as soon as possible: Allergic reactions-skin rash, itching, hives, swelling of the face, lips, tongue, or throat High potassium level-muscle weakness, fast or irregular heartbeat Kidney injury-decrease in the amount of urine, swelling of the ankles, hands, or feet Low blood  pressure-dizziness, feeling faint or lightheaded, blurry vision Side effects that usually do not require medical attention (report to your careteam if they continue or are bothersome): Dizziness Headache Runny or stuffy nose This list may not describe all possible side effects. Call your doctor for medical advice about side effects. You may report side effects to FDA at1-800-FDA-1088. Where should I keep my medication? Keep out of the reach of children and pets. Store at room temperature between 20 and 25 degrees C (68 and 77 degrees F). Protect from light. Keep the container tightly closed. Get rid of any unusedmedication after the expiration date. To get rid of medications that are no longer needed or have expired: Take the medication to a medication take-back program. Check with your pharmacy or law enforcement to find a location. If you cannot return the medication, check the label or package insert to see if the medication should be thrown out in the garbage or flushed down the toilet. If you are not sure, ask your care team. If it is safe to put in the trash, empty the medication out of the container. Mix the medication with cat litter, dirt, coffee grounds, or other unwanted substance. Seal the mixture in a bag or container. Put it in the trash. NOTE: This sheet is a summary. It may not cover all possible information. If you have questions about this medicine, talk to your doctor, pharmacist, orhealth care provider.  2022 Elsevier/Gold Standard (2020-04-28 13:49:17)

## 2021-01-17 NOTE — Progress Notes (Signed)
Provider: Marlita Keil FNP-C  Virgie Dad, MD  Patient Care Team: Virgie Dad, MD as PCP - General (Internal Medicine) Druscilla Brownie, MD as Consulting Physician (Dermatology) Garlan Fair, MD as Consulting Physician (Gastroenterology) Mosetta Anis, MD as Referring Physician (Allergy) Sueanne Margarita, MD as Consulting Physician (Cardiology)  Extended Emergency Contact Information Primary Emergency Contact: Steeves,Doug Address: [address]          [place], [place] [address] Johnnette Litter of [place] Phone: [phone] Work Phone: [phone] Mobile Phone: [phone] Relation: Spouse  Code Status:  DNR Goals of care: Advanced Directive information Advanced Directives 01/14/2021  Does Patient Have a Medical Advance Directive? Yes  Type of Paramedic of [place];Living will;Out of facility DNR (pink MOST or yellow form)  Does patient want to make changes to medical advance directive? No - Patient declined  Copy of [place] in Chart? Yes - validated most recent copy scanned in chart (See row information)  Pre-existing out of facility DNR order (yellow form or pink MOST form) -     Chief Complaint  Patient presents with   Acute Visit    Patient complains of blood pressure running high.    HPI:  Pt is a 80 y.o. female seen today for an acute visit for evaluation of high blood pressure.states Systolic in the [id number]  and diastolic in the [id number] as had increased stress level trying to plan for a family gathering but has decided not to.though has had high blood pressure for over one month.  She denies any denies any headache,dizziness,vision changes,fatigue,chest tightness,palpitation,chest pain or shortness of breath.    Has chronic left leg edema wears compression stockings. Does not add any extra salt in food but does not use low salt in soups. Tries to stay active.  Past Medical History:   Diagnosis Date   Allergy    Ankle fracture, left    Baker cyst 04/19/2020   Bigeminy    Cataract    Colon polyps    Diverticulosis    FH: colon cancer    Menopausal and perimenopausal disorder    Multiple allergies    Osteoporosis 2008   PER DEXA SCAN   PVC (premature ventricular contraction)    Past Surgical History:  Procedure Laterality Date   APPENDECTOMY     CATARACT EXTRACTION W/ INTRAOCULAR LENS  IMPLANT, BILATERAL     IR US GUIDE BX ASP/DRAIN  05/12/2020   PERFORATED APPENDIX     SMALL BOWEL OBSTRUCTION      Allergies  Allergen Reactions   Pollen Extract     Outpatient Encounter Medications as of 01/17/2021  Medication Sig   Calcium Carb-Ergocalciferol 500-200 MG-UNIT TABS Take 2 tablets by mouth daily.    Cholecalciferol (VITAMIN D3) 2000 units capsule Take 2,000 Units by mouth daily.   denosumab (PROLIA) 60 MG/ML SOLN injection Inject 60 mg into the skin every 6 (six) months. Administer in upper arm, thigh, or abdomen   NON FORMULARY Antronex dietary supplement 1 per day   NON FORMULARY Seneplex take 1 tablet once daily   NON FORMULARY Bio-C-Plus, take tablet by mouth once daily   vitamin B-12 (CYANOCOBALAMIN) 1000 MCG tablet Take 1,000 mcg by mouth daily.   No facility-administered encounter medications on file as of 01/17/2021.    Review of Systems  Constitutional:  Negative for appetite change, chills, fatigue, fever and unexpected weight change.  HENT:  Negative for congestion, dental problem, ear  discharge, ear pain, facial swelling, hearing loss, nosebleeds, postnasal drip, rhinorrhea, sinus pressure, sinus pain, sneezing, sore throat, tinnitus and trouble swallowing.   Eyes:  Negative for pain, discharge, redness, itching and visual disturbance.  Respiratory:  Negative for cough, chest tightness, shortness of breath and wheezing.   Cardiovascular:  Negative for chest pain and palpitations.       Left leg chronic swelling   Gastrointestinal:  Negative  for abdominal distention, abdominal pain, blood in stool, constipation, diarrhea, nausea and vomiting.  Genitourinary:  Negative for difficulty urinating, dysuria, flank pain, frequency and urgency.  Musculoskeletal:  Negative for arthralgias, back pain, gait problem, joint swelling, myalgias, neck pain and neck stiffness.  Skin:  Negative for color change, pallor, rash and wound.  Neurological:  Negative for dizziness, syncope, speech difficulty, weakness, light-headedness, numbness and headaches.  Hematological:  Does not bruise/bleed easily.  Psychiatric/Behavioral:  Negative for agitation, behavioral problems, confusion and sleep disturbance. The patient is not nervous/anxious.        Has had increased stress level    Immunization History  Administered Date(s) Administered   Influenza, High Dose Seasonal PF 01/18/2015, 03/28/2019, 04/16/2020   Influenza,inj,Quad PF,6+ Mos 04/24/2016, 04/12/2018   Influenza-Unspecified 04/09/2017, 04/16/2020   Moderna SARS-COV2 Booster Vaccination 10/19/2020   Moderna Sars-Covid-2 Vaccination 07/01/2019, 07/29/2019, 05/04/2020   Pneumococcal Conjugate-13 09/22/2013   Pneumococcal Polysaccharide-23 09/02/2004, 09/18/2011   Tdap 09/18/2011   Zoster Recombinat (Shingrix) 08/21/2017, 11/12/2017   Zoster, Live 08/19/2008   Pertinent  Health Maintenance Due  Topic Date Due   COLONOSCOPY (Pts 45-43yr Insurance coverage will need to be confirmed)  12/03/2020   INFLUENZA VACCINE  01/17/2021   MAMMOGRAM  12/21/2021   DEXA SCAN  Completed   PNA vac Low Risk Adult  Completed   Fall Risk  01/14/2021 12/17/2020 12/10/2020 06/17/2020 05/20/2020  Falls in the past year? 0 0 0 0 0  Number falls in past yr: 0 0 0 0 0  Injury with Fall? 0 - 0 0 0  Risk for fall due to : No Fall Risks - No Fall Risks - -  Follow up Falls evaluation completed Falls evaluation completed Falls evaluation completed - -   Functional Status Survey:    Vitals:   01/17/21 0842  BP: (!)  140/98  Pulse: 66  Resp: 16  Temp: (!) 96.9 F (36.1 C)  SpO2: 97%  Weight: 133 lb (60.3 kg)  Height: '5\' 1"'$  (1.549 m)   Body mass index is 25.13 kg/m. Physical Exam Vitals reviewed.  Constitutional:      General: She is not in acute distress.    Appearance: Normal appearance. She is normal weight. She is not ill-appearing or diaphoretic.  HENT:     Head: Normocephalic.     Nose: Nose normal. No congestion or rhinorrhea.     Mouth/Throat:     Mouth: Mucous membranes are moist.     Pharynx: Oropharynx is clear. No oropharyngeal exudate or posterior oropharyngeal erythema.  Eyes:     General: No scleral icterus.       Right eye: No discharge.        Left eye: No discharge.     Extraocular Movements: Extraocular movements intact.     Conjunctiva/sclera: Conjunctivae normal.     Pupils: Pupils are equal, round, and reactive to light.  Neck:     Vascular: No carotid bruit.  Cardiovascular:     Rate and Rhythm: Normal rate and regular rhythm.     Pulses: Normal  pulses.     Heart sounds: Normal heart sounds. No murmur heard.   No friction rub. No gallop.  Pulmonary:     Effort: Pulmonary effort is normal. No respiratory distress.     Breath sounds: Normal breath sounds. No wheezing, rhonchi or rales.  Chest:     Chest wall: No tenderness.  Abdominal:     General: Bowel sounds are normal. There is no distension.     Palpations: Abdomen is soft. There is no mass.     Tenderness: There is no abdominal tenderness. There is no right CVA tenderness, left CVA tenderness, guarding or rebound.  Musculoskeletal:        General: No swelling or tenderness. Normal range of motion.     Cervical back: Normal range of motion. No rigidity or tenderness.     Right lower leg: No edema.     Left lower leg: No edema.  Lymphadenopathy:     Cervical: No cervical adenopathy.  Skin:    General: Skin is warm and dry.     Coloration: Skin is not pale.     Findings: No bruising, erythema, lesion  or rash.  Neurological:     Mental Status: She is alert and oriented to person, place, and time.     Cranial Nerves: No cranial nerve deficit.     Sensory: No sensory deficit.     Motor: No weakness.     Coordination: Coordination normal.     Gait: Gait normal.  Psychiatric:        Mood and Affect: Mood normal.        Speech: Speech normal.        Behavior: Behavior normal.        Thought Content: Thought content normal.        Judgment: Judgment normal.    Labs reviewed: Recent Labs    09/02/20 0000 09/07/20 0000  NA  --  141  K  --  4.1  CL  --  104  CO2  --  22  BUN  --  17  CREATININE  --  0.7  CALCIUM 9.9  --    No results for input(s): AST, ALT, ALKPHOS, BILITOT, PROT, ALBUMIN in the last 8760 hours. Recent Labs    12/14/20 0000  WBC 3.4  HGB 12.8  HCT 38  PLT 178   Lab Results  Component Value Date   TSH 1.84 10/29/2014   Lab Results  Component Value Date   HGBA1C 5.4 10/29/2014   Lab Results  Component Value Date   CHOL 239 (A) 12/14/2020   HDL 78 (A) 12/14/2020   LDLCALC 149 12/14/2020   TRIG 59 12/14/2020    Significant Diagnostic Results in last 30 days:  MM DIAG BREAST TOMO UNI LEFT  Result Date: 01/13/2021 CLINICAL DATA:  Patient returns today to evaluate a possible LEFT breast asymmetry questioned on recent screening mammogram EXAM: DIGITAL DIAGNOSTIC UNILATERAL LEFT MAMMOGRAM WITH TOMOSYNTHESIS AND CAD TECHNIQUE: Left digital diagnostic mammography and breast tomosynthesis was performed. The images were evaluated with computer-aided detection. COMPARISON:  Previous exams including recent screening mammogram dated 12/21/2020. ACR Breast Density Category b: There are scattered areas of fibroglandular density. FINDINGS: On today's additional diagnostic views, including spot compression with 3D tomosynthesis, there is no persistent asymmetry within the outer LEFT breast indicating superimposition of normal fibroglandular tissues. IMPRESSION: No  evidence of malignancy. Patient may return to routine annual bilateral screening mammogram schedule. RECOMMENDATION: Screening mammogram in one year.(Code:SM-B-01Y) I have  discussed the findings and recommendations with the patient. If applicable, a reminder letter will be sent to the patient regarding the next appointment. BI-RADS CATEGORY  1: Negative. Electronically Signed   By: Franki Cabot M.D.   On: 01/13/2021 10:38   MM 3D SCREEN BREAST BILATERAL  Result Date: 12/23/2020 CLINICAL DATA:  Screening. EXAM: DIGITAL SCREENING BILATERAL MAMMOGRAM WITH TOMOSYNTHESIS AND CAD TECHNIQUE: Bilateral screening digital craniocaudal and mediolateral oblique mammograms were obtained. Bilateral screening digital breast tomosynthesis was performed. The images were evaluated with computer-aided detection. COMPARISON:  Prior films ACR Breast Density Category b: There are scattered areas of fibroglandular density. FINDINGS: In the left breast, a possible asymmetry warrants further evaluation. In the right breast, no findings suspicious for malignancy. IMPRESSION: Further evaluation is suggested for possible asymmetry in the left breast. RECOMMENDATION: Diagnostic mammogram and possibly ultrasound of the left breast. (Code:[id number]-84M) The patient will be contacted regarding the findings, and additional imaging will be scheduled. BI-RADS CATEGORY  0: Incomplete. Need additional imaging evaluation and/or prior mammograms for comparison. Electronically Signed   By: Abelardo Diesel M.D.   On: 12/23/2020 10:26    Assessment/Plan  Essential hypertension, benign Elevated B/p from her baseline.HR within normal range.Asymptomatic with Negative exam findings. - Start on losartan as below side effects discussed.Additional educational information on Hypertension and Losartan provided on AVS - losartan (COZAAR) 25 MG tablet; Take 1 tablet (25 mg total) by mouth daily.  Dispense: 30 tablet; Refill: 0  Family/ staff Communication:  Reviewed plan of care with patient verbalized understanding.  Labs/tests ordered: None   Next Appointment: 2 weeks with Dr.Gupta for B/p and check BMP.   Sandrea Hughs, NP

## 2021-01-31 ENCOUNTER — Other Ambulatory Visit: Payer: Self-pay

## 2021-01-31 ENCOUNTER — Encounter: Payer: Self-pay | Admitting: Adult Health

## 2021-01-31 ENCOUNTER — Non-Acute Institutional Stay: Payer: Medicare PPO | Admitting: Adult Health

## 2021-01-31 VITALS — BP 132/92 | HR 84 | Temp 97.9°F | Ht 61.0 in | Wt 134.0 lb

## 2021-01-31 DIAGNOSIS — R2242 Localized swelling, mass and lump, left lower limb: Secondary | ICD-10-CM | POA: Diagnosis not present

## 2021-01-31 DIAGNOSIS — I10 Essential (primary) hypertension: Secondary | ICD-10-CM

## 2021-01-31 MED ORDER — LOSARTAN POTASSIUM 25 MG PO TABS: 25.0000 mg | ORAL_TABLET | Freq: Every day | ORAL | 4 refills | Status: DC

## 2021-01-31 NOTE — Patient Instructions (Addendum)
Your BP has improved  Come to [facility] to have your lab drawn to check your renal function and electrolytes

## 2021-01-31 NOTE — Progress Notes (Signed)
[facility]  POS:  clinic  Provider:  Cindi Carbon, [place] [phone]   Code Status: DNR Goals of Care:  Advanced Directives 01/31/2021  Does Patient Have a Medical Advance Directive? Yes  Type of Paramedic of [place];Living will;Out of facility DNR (pink MOST or yellow form)  Does patient want to make changes to medical advance directive? No - Patient declined  Copy of [place] in Chart? Yes - validated most recent copy scanned in chart (See row information)  Pre-existing out of facility DNR order (yellow form or pink MOST form) -     Chief Complaint  Patient presents with   Medical Management of Chronic Issues    Patient returns to the clinic for her 2 week follow up.    HPI: Patient is a 80 y.o. female seen today for an acute visit for follow up regarding hypertension. Seen by Webb Silversmith NP on 8/1 due to elevated bp in the 150/90 range and was having some stress due to a planning for a family gathering. She was started on losartan 25 mg and is here for a bp recheck and lab review.   Most recent BPs recorded at home 112/82 119/66 123/87 138/85  BMP not yet drawn  Feels great with no s/e  Continues with LLE edema which as improved with compression hose. Is not painful. No tenderness. Negative doppler in the past. Hx of left knee bakers cysts.   Past Medical History:  Diagnosis Date   Allergy    Ankle fracture, left    Baker cyst 04/19/2020   Bigeminy    Cataract    Colon polyps    Diverticulosis    FH: colon cancer    Menopausal and perimenopausal disorder    Multiple allergies    Osteoporosis 2008   PER DEXA SCAN   PVC (premature ventricular contraction)     Past Surgical History:  Procedure Laterality Date   APPENDECTOMY     CATARACT EXTRACTION W/ INTRAOCULAR LENS  IMPLANT, BILATERAL     IR US GUIDE BX ASP/DRAIN  05/12/2020   PERFORATED APPENDIX     SMALL BOWEL  OBSTRUCTION      Allergies  Allergen Reactions   Pollen Extract     Outpatient Encounter Medications as of 01/31/2021  Medication Sig   Calcium Carb-Ergocalciferol 500-200 MG-UNIT TABS Take 2 tablets by mouth daily.    Cholecalciferol (VITAMIN D3) 2000 units capsule Take 2,000 Units by mouth daily.   denosumab (PROLIA) 60 MG/ML SOLN injection Inject 60 mg into the skin every 6 (six) months. Administer in upper arm, thigh, or abdomen   losartan (COZAAR) 25 MG tablet Take 1 tablet (25 mg total) by mouth daily.   NON FORMULARY Antronex dietary supplement 1 per day   NON FORMULARY Seneplex take 1 tablet once daily   NON FORMULARY Bio-C-Plus, take tablet by mouth once daily   vitamin B-12 (CYANOCOBALAMIN) 1000 MCG tablet Take 1,000 mcg by mouth daily.   No facility-administered encounter medications on file as of 01/31/2021.    Review of Systems:  Review of Systems  Constitutional:  Negative for activity change, appetite change, chills, diaphoresis, fatigue, fever and unexpected weight change.  HENT:  Negative for congestion.   Respiratory:  Negative for cough, shortness of breath and wheezing.   Cardiovascular:  Positive for leg swelling. Negative for chest pain and palpitations.  Gastrointestinal:  Negative for abdominal distention, abdominal pain, constipation and diarrhea.  Genitourinary:  Negative for difficulty urinating and dysuria.  Musculoskeletal:  Negative for arthralgias, back pain, gait problem, joint swelling and myalgias.  Neurological:  Negative for dizziness, tremors, seizures, syncope, facial asymmetry, speech difficulty, weakness, light-headedness, numbness and headaches.  Psychiatric/Behavioral:  Negative for agitation, behavioral problems and confusion.    Health Maintenance  Topic Date Due   COLONOSCOPY (Pts 45-10yr Insurance coverage will need to be confirmed)  12/03/2020   INFLUENZA VACCINE  01/17/2021   TETANUS/TDAP  09/17/2021   MAMMOGRAM  12/21/2021   DEXA  SCAN  Completed   COVID-19 Vaccine  Completed   PNA vac Low Risk Adult  Completed   Zoster Vaccines- Shingrix  Completed   HPV VACCINES  Aged Out    Physical Exam: Vitals:   01/31/21 1402  BP: (!) 132/92  Pulse: 84  Temp: 97.9 F (36.6 C)  SpO2: 97%  Weight: 134 lb (60.8 kg)  Height: '5\' 1"'$  (1.549 m)   Body mass index is 25.32 kg/m. Physical Exam Vitals and nursing note reviewed.  Constitutional:      General: She is not in acute distress.    Appearance: She is not diaphoretic.  HENT:     Head: Normocephalic and atraumatic.  Neck:     Vascular: No JVD.  Cardiovascular:     Rate and Rhythm: Normal rate and regular rhythm.     Heart sounds: No murmur heard. Pulmonary:     Effort: Pulmonary effort is normal. No respiratory distress.     Breath sounds: Normal breath sounds. No wheezing.  Skin:    General: Skin is warm and dry.  Neurological:     Mental Status: She is alert and oriented to person, place, and time.    Labs reviewed: Basic Metabolic Panel: Recent Labs    09/02/20 0000 09/07/20 0000  NA  --  141  K  --  4.1  CL  --  104  CO2  --  22  BUN  --  17  CREATININE  --  0.7  CALCIUM 9.9  --    Liver Function Tests: No results for input(s): AST, ALT, ALKPHOS, BILITOT, PROT, ALBUMIN in the last 8760 hours. No results for input(s): LIPASE, AMYLASE in the last 8760 hours. No results for input(s): AMMONIA in the last 8760 hours. CBC: Recent Labs    12/14/20 0000  WBC 3.4  HGB 12.8  HCT 38  PLT 178   Lipid Panel: Recent Labs    02/26/20 0000 09/07/20 0000 12/14/20 0000  CHOL  --  245* 239*  HDL 79* 80* 78*  LDLCALC 130 147 149  TRIG 71 87 59   Lab Results  Component Value Date   HGBA1C 5.4 10/29/2014    Procedures since last visit: MM DIAG BREAST TOMO UNI LEFT  Result Date: 01/13/2021 CLINICAL DATA:  Patient returns today to evaluate a possible LEFT breast asymmetry questioned on recent screening mammogram EXAM: DIGITAL DIAGNOSTIC  UNILATERAL LEFT MAMMOGRAM WITH TOMOSYNTHESIS AND CAD TECHNIQUE: Left digital diagnostic mammography and breast tomosynthesis was performed. The images were evaluated with computer-aided detection. COMPARISON:  Previous exams including recent screening mammogram dated 12/21/2020. ACR Breast Density Category b: There are scattered areas of fibroglandular density. FINDINGS: On today's additional diagnostic views, including spot compression with 3D tomosynthesis, there is no persistent asymmetry within the outer LEFT breast indicating superimposition of normal fibroglandular tissues. IMPRESSION: No evidence of malignancy. Patient may return to routine annual bilateral screening mammogram schedule. RECOMMENDATION: Screening mammogram in one year.(Code:SM-B-01Y) I have discussed  the findings and recommendations with the patient. If applicable, a reminder letter will be sent to the patient regarding the next appointment. BI-RADS CATEGORY  1: Negative. Electronically Signed   By: Franki Cabot M.D.   On: 01/13/2021 10:38    Assessment/Plan  1. Essential hypertension, benign Improved, continue current regimen - losartan (COZAAR) 25 MG tablet; Take 1 tablet (25 mg total) by mouth daily.  Dispense: 30 tablet; Refill: 4  2. Localized swelling of left lower leg Venous insuff vs lymphedema. No pain present. Also has hx of left knee bakers cysts.  Improved per pt with compression hose but still present Continue compression hose and encourage elevation Needs BMP in am  - losartan (COZAAR) 25 MG tablet; Take 1 tablet (25 mg total) by mouth daily.  Dispense: 30 tablet; Refill: 4    Total time 25 min:  time greater than 50% of total time spent doing pt counseling and coordination of care    Labs/tests ordered:  * No order type specified *BMP Next appt:  06/16/2021

## 2021-02-01 LAB — BASIC METABOLIC PANEL
BUN: 13 (ref 4–21)
CO2: 27 — AB (ref 13–22)
Chloride: 104 (ref 99–108)
Creatinine: 0.8 (ref 0.5–1.1)
Glucose: 90
Potassium: 3.9 (ref 3.4–5.3)
Sodium: 141 (ref 137–147)

## 2021-02-01 LAB — COMPREHENSIVE METABOLIC PANEL: Calcium: 9.5 (ref 8.7–10.7)

## 2021-03-30 ENCOUNTER — Encounter: Payer: Self-pay | Admitting: Internal Medicine

## 2021-04-01 ENCOUNTER — Encounter: Payer: Self-pay | Admitting: Family

## 2021-04-01 ENCOUNTER — Other Ambulatory Visit: Payer: Self-pay

## 2021-04-01 ENCOUNTER — Ambulatory Visit (INDEPENDENT_AMBULATORY_CARE_PROVIDER_SITE_OTHER): Payer: Medicare PPO | Admitting: Family

## 2021-04-01 VITALS — BP 136/82 | HR 76 | Temp 98.2°F | Resp 16 | Ht 61.0 in | Wt 133.6 lb

## 2021-04-01 DIAGNOSIS — M7122 Synovial cyst of popliteal space [Baker], left knee: Secondary | ICD-10-CM

## 2021-04-01 NOTE — Progress Notes (Signed)
Provider: Jahyra Sukup FNP-C  Virgie Dad, MD  Patient Care Team: Virgie Dad, MD as PCP - General (Internal Medicine) Druscilla Brownie, MD as Consulting Physician (Dermatology) Garlan Fair, MD as Consulting Physician (Gastroenterology) Mosetta Anis, MD as Referring Physician (Allergy) Sueanne Margarita, MD as Consulting Physician (Cardiology)  Extended Emergency Contact Information Primary Emergency Contact: Burdett,Doug Address: [address]          [place], [place] [address] Johnnette Litter of [place] Phone: [phone] Work Phone: [phone] Mobile Phone: [phone] Relation: Spouse  Code Status:  DNR Goals of care: Advanced Directive information Advanced Directives 04/01/2021  Does Patient Have a Medical Advance Directive? Yes  Type of Paramedic of [place];Living will;Out of facility DNR (pink MOST or yellow form)  Does patient want to make changes to medical advance directive? No - Patient declined  Copy of [place] in Chart? Yes - validated most recent copy scanned in chart (See row information)  Pre-existing out of facility DNR order (yellow form or pink MOST form) -     Chief Complaint  Patient presents with   Acute Visit    Patient complains of knee pain.     HPI:  Pt is a 80 y.o. female seen today for an acute visit for evaluation of knee pain X 1 months. Has had no fall or injury to knee.states was seen in the past for similar symptoms had a Baker's cyst that required drainage at [facility].she request referral to IR to drain cyst.she declines any knee X-ray.  States left leg swelling x several years.   Past Medical History:  Diagnosis Date   Allergy    Ankle fracture, left    Baker cyst 04/19/2020   Bigeminy    Cataract    Colon polyps    Diverticulosis    FH: colon cancer    Menopausal and perimenopausal disorder    Multiple allergies    Osteoporosis  2008   PER DEXA SCAN   PVC (premature ventricular contraction)    Past Surgical History:  Procedure Laterality Date   APPENDECTOMY     CATARACT EXTRACTION W/ INTRAOCULAR LENS  IMPLANT, BILATERAL     IR US GUIDE BX ASP/DRAIN  05/12/2020   PERFORATED APPENDIX     SMALL BOWEL OBSTRUCTION      Allergies  Allergen Reactions   Pollen Extract     Outpatient Encounter Medications as of 04/01/2021  Medication Sig   Calcium Carb-Ergocalciferol 500-200 MG-UNIT TABS Take 2 tablets by mouth daily.    Cholecalciferol (VITAMIN D3) 2000 units capsule Take 2,000 Units by mouth daily.   denosumab (PROLIA) 60 MG/ML SOLN injection Inject 60 mg into the skin every 6 (six) months. Administer in upper arm, thigh, or abdomen   NON FORMULARY Antronex dietary supplement 1 per day   NON FORMULARY Seneplex take 1 tablet once daily   NON FORMULARY Bio-C-Plus, take tablet by mouth once daily   vitamin B-12 (CYANOCOBALAMIN) 1000 MCG tablet Take 1,000 mcg by mouth daily.   losartan (COZAAR) 25 MG tablet Take 1 tablet (25 mg total) by mouth daily.   No facility-administered encounter medications on file as of 04/01/2021.    Review of Systems  Constitutional:  Negative for appetite change, chills, fatigue, fever and unexpected weight change.  Respiratory:  Negative for cough, chest tightness, shortness of breath and wheezing.   Cardiovascular:  Negative for chest pain, palpitations and leg swelling.  Musculoskeletal:  Positive for arthralgias and gait problem. Negative for back pain, joint swelling, myalgias, neck pain and neck stiffness.       Back of the knee swelling   Skin:  Negative for color change, pallor and rash.  Neurological:  Negative for dizziness, weakness, light-headedness, numbness and headaches.  Hematological:  Does not bruise/bleed easily.  Psychiatric/Behavioral:  Positive for suicidal ideas. Negative for agitation, behavioral problems, confusion and sleep disturbance. The patient is not  nervous/anxious.    Immunization History  Administered Date(s) Administered   Influenza, High Dose Seasonal PF 01/18/2015, 03/28/2019, 04/16/2020   Influenza,inj,Quad PF,6+ Mos 04/24/2016, 04/12/2018   Influenza-Unspecified 04/09/2017, 04/16/2020   Moderna SARS-COV2 Booster Vaccination 10/19/2020   Moderna Sars-Covid-2 Vaccination 07/01/2019, 07/29/2019, 05/04/2020   Pneumococcal Conjugate-13 09/22/2013   Pneumococcal Polysaccharide-23 09/02/2004, 09/18/2011   Tdap 09/18/2011   Zoster Recombinat (Shingrix) 08/21/2017, 11/12/2017   Zoster, Live 08/19/2008   Pertinent  Health Maintenance Due  Topic Date Due   COLONOSCOPY (Pts 45-61yrs Insurance coverage will need to be confirmed)  12/03/2020   INFLUENZA VACCINE  01/17/2021   MAMMOGRAM  12/21/2021   DEXA SCAN  Completed   Fall Risk  04/01/2021 01/31/2021 01/14/2021 12/17/2020 12/10/2020  Falls in the past year? 0 0 0 0 0  Number falls in past yr: 0 0 0 0 0  Injury with Fall? 0 - 0 - 0  Risk for fall due to : No Fall Risks - No Fall Risks - No Fall Risks  Follow up Falls evaluation completed Falls evaluation completed Falls evaluation completed Falls evaluation completed Falls evaluation completed   Functional Status Survey:    Vitals:   04/01/21 1532  BP: 136/82  Pulse: 76  Resp: 16  Temp: 98.2 F (36.8 C)  SpO2: 98%  Weight: 133 lb 9.6 oz (60.6 kg)  Height: 5\' 1"  (1.549 m)   Body mass index is 25.24 kg/m. Physical Exam Vitals reviewed.  Constitutional:      General: She is not in acute distress.    Appearance: Normal appearance. She is normal weight. She is not ill-appearing or diaphoretic.  Eyes:     General: No scleral icterus.       Right eye: No discharge.        Left eye: No discharge.     Conjunctiva/sclera: Conjunctivae normal.     Pupils: Pupils are equal, round, and reactive to light.  Cardiovascular:     Rate and Rhythm: Normal rate and regular rhythm.     Pulses: Normal pulses.     Heart sounds: Normal  heart sounds. No murmur heard.   No friction rub. No gallop.  Pulmonary:     Effort: Pulmonary effort is normal. No respiratory distress.     Breath sounds: Normal breath sounds. No wheezing, rhonchi or rales.  Chest:     Chest wall: No tenderness.  Musculoskeletal:        General: No swelling.     Right knee: Normal.     Left knee: Swelling present. No effusion, erythema, ecchymosis or crepitus. Tenderness present.     Right lower leg: No edema.     Left lower leg: No edema.  Skin:    General: Skin is warm and dry.     Coloration: Skin is not pale.     Findings: No bruising, erythema, lesion or rash.  Neurological:     Mental Status: She is alert and oriented to person, place, and time.     Motor: No weakness.  Gait: Gait normal.  Psychiatric:        Mood and Affect: Mood normal.        Speech: Speech normal.        Behavior: Behavior normal.        Thought Content: Thought content normal.        Judgment: Judgment normal.    Labs reviewed: Recent Labs    09/02/20 0000 09/07/20 0000 02/01/21 0000  NA  --  141 141  K  --  4.1 3.9  CL  --  104 104  CO2  --  22 27*  BUN  --  17 13  CREATININE  --  0.7 0.8  CALCIUM 9.9  --  9.5   No results for input(s): AST, ALT, ALKPHOS, BILITOT, PROT, ALBUMIN in the last 8760 hours. Recent Labs    12/14/20 0000  WBC 3.4  HGB 12.8  HCT 38  PLT 178   Lab Results  Component Value Date   TSH 1.84 10/29/2014   Lab Results  Component Value Date   HGBA1C 5.4 10/29/2014   Lab Results  Component Value Date   CHOL 239 (A) 12/14/2020   HDL 78 (A) 12/14/2020   LDLCALC 149 12/14/2020   TRIG 59 12/14/2020    Significant Diagnostic Results in last 30 days:  No results found.  Assessment/Plan  Baker's cyst of knee, left Palpated on posterior knee with slight tender to touch.No erythema or effusion noted on knee. - declined imaging Will refer to IR to drain cyst made aware IR will call for appointment  - Ambulatory  referral to [facility]  Family/ staff Communication: Reviewed plan of care with patient verbalized understanding   Labs/tests ordered: None   Next Appointment: As needed if symptoms worsen or fail to improve    Sandrea Hughs, NP

## 2021-04-01 NOTE — Patient Instructions (Signed)
Referral ordered for ultrasound guide drain of baker cyst at [facility].imaging center will call you with appointment

## 2021-04-05 ENCOUNTER — Other Ambulatory Visit: Payer: Self-pay | Admitting: Family

## 2021-04-05 DIAGNOSIS — M7122 Synovial cyst of popliteal space [Baker], left knee: Secondary | ICD-10-CM

## 2021-04-12 ENCOUNTER — Telehealth: Payer: Self-pay | Admitting: *Deleted

## 2021-04-12 NOTE — Telephone Encounter (Signed)
Patient called and left message on Clinical intake stating that she needed to speak with Dr. Lyndel Safe directly regarding getting a letter of referral to [employer].    Tried calling patient back to get more information but phone just rings, NA.  Will try again later.

## 2021-04-13 ENCOUNTER — Ambulatory Visit
Admission: RE | Admit: 2021-04-13 | Discharge: 2021-04-13 | Disposition: A | Discharge status: Home / Self Care | Payer: Medicare PPO | Source: Ambulatory Visit | Attending: Family | Admitting: Family

## 2021-04-13 DIAGNOSIS — M7122 Synovial cyst of popliteal space [Baker], left knee: Secondary | ICD-10-CM | POA: Diagnosis not present

## 2021-05-11 ENCOUNTER — Other Ambulatory Visit: Payer: Self-pay

## 2021-05-11 ENCOUNTER — Non-Acute Institutional Stay: Payer: Medicare PPO | Admitting: Internal Medicine

## 2021-05-11 ENCOUNTER — Encounter: Payer: Self-pay | Admitting: Internal Medicine

## 2021-05-11 VITALS — BP 134/92 | HR 82 | Temp 98.2°F | Ht 61.0 in | Wt 135.6 lb

## 2021-05-11 DIAGNOSIS — R2242 Localized swelling, mass and lump, left lower limb: Secondary | ICD-10-CM

## 2021-05-11 DIAGNOSIS — E7800 Pure hypercholesterolemia, unspecified: Secondary | ICD-10-CM

## 2021-05-11 DIAGNOSIS — M25562 Pain in left knee: Secondary | ICD-10-CM

## 2021-05-11 DIAGNOSIS — I10 Essential (primary) hypertension: Secondary | ICD-10-CM | POA: Diagnosis not present

## 2021-05-11 DIAGNOSIS — M7122 Synovial cyst of popliteal space [Baker], left knee: Secondary | ICD-10-CM | POA: Diagnosis not present

## 2021-05-11 DIAGNOSIS — M26623 Arthralgia of bilateral temporomandibular joint: Secondary | ICD-10-CM

## 2021-05-11 DIAGNOSIS — M810 Age-related osteoporosis without current pathological fracture: Secondary | ICD-10-CM

## 2021-05-11 NOTE — Progress Notes (Signed)
Location: [place] of Service:  Clinic (12)  Provider:   Code Status:  Goals of Care:  Advanced Directives 05/11/2021  Does Patient Have a Medical Advance Directive? Yes  Type of Paramedic of [place];Living will;Out of facility DNR (pink MOST or yellow form)  Does patient want to make changes to medical advance directive? No - Patient declined  Copy of [place] in Chart? Yes - validated most recent copy scanned in chart (See row information)  Pre-existing out of facility DNR order (yellow form or pink MOST form) -     Chief Complaint  Patient presents with   Quality Metric Gaps    Colonoscopy   Acute Visit    Leg issues    HPI: Patient is a 80 y.o. female seen today for an acute visit for Left Leg Pain and Swelling  Patient has h/o Osteoporosis ON Prolia Left Lower leg Swelling since 05/21. [place] was negative Does have h/o Left Ankle Fracture in 2009 H/o Left knee Bakers Cyst TMJ arthritis  Patient had Left Baker Cyst Recurrence and underwent Aspiration and therapeutic injection of left popliteal fossa Baker's cyst By radiology  She was doing well but started having pain in that knee for past few days. She also noticed swelling in medial side of her Knee. Was having difficulty walking She started taking Tylenol and says it is better today Pain is better and swelling is almost gone. Is walking now but still putting less pressure on that leg.   Past Medical History:  Diagnosis Date   Allergy    Ankle fracture, left    Baker cyst 04/19/2020   Bigeminy    Cataract    Colon polyps    Diverticulosis    FH: colon cancer    Menopausal and perimenopausal disorder    Multiple allergies    Osteoporosis 2008   PER DEXA SCAN   PVC (premature ventricular contraction)     Past Surgical History:  Procedure Laterality Date   APPENDECTOMY     CATARACT EXTRACTION W/ INTRAOCULAR LENS  IMPLANT,  BILATERAL     IR US GUIDE BX ASP/DRAIN  05/12/2020   PERFORATED APPENDIX     SMALL BOWEL OBSTRUCTION      Allergies  Allergen Reactions   Pollen Extract     Outpatient Encounter Medications as of 05/11/2021  Medication Sig   Calcium Carb-Ergocalciferol 500-200 MG-UNIT TABS Take 2 tablets by mouth daily.    Cholecalciferol (VITAMIN D3) 2000 units capsule Take 2,000 Units by mouth daily.   denosumab (PROLIA) 60 MG/ML SOLN injection Inject 60 mg into the skin every 6 (six) months. Administer in upper arm, thigh, or abdomen   NON FORMULARY Antronex dietary supplement 1 per day   NON FORMULARY Seneplex take 1 tablet once daily   NON FORMULARY Bio-C-Plus, take tablet by mouth once daily   vitamin B-12 (CYANOCOBALAMIN) 1000 MCG tablet Take 1,000 mcg by mouth daily.   losartan (COZAAR) 25 MG tablet Take 1 tablet (25 mg total) by mouth daily.   No facility-administered encounter medications on file as of 05/11/2021.    Review of Systems:  Review of Systems Review of Systems  Constitutional: Negative for activity change, appetite change, chills, diaphoresis, fatigue and fever.  HENT: Negative for mouth sores, postnasal drip, rhinorrhea, sinus pain and sore throat.   Respiratory: Negative for apnea, cough, chest tightness, shortness of breath and wheezing.   Cardiovascular: Negative for chest pain, palpitations  and leg swelling.  Gastrointestinal: Negative for abdominal distention, abdominal pain, constipation, diarrhea, nausea and vomiting.  Genitourinary: Negative for dysuria and frequency.  Musculoskeletal: Negative for arthralgias, and myalgias.  Skin: Negative for rash.  Neurological: Negative for dizziness, syncope, weakness, light-headedness and numbness.  Psychiatric/Behavioral: Negative for behavioral problems, confusion and sleep disturbance.    Health Maintenance  Topic Date Due   COLONOSCOPY (Pts 45-79yrs Insurance coverage will need to be confirmed)  12/03/2020   COVID-19  Vaccine (4 - Booster for Moderna series) 05/27/2021   TETANUS/TDAP  09/17/2021   MAMMOGRAM  12/21/2021   Pneumonia Vaccine 64+ Years old  Completed   INFLUENZA VACCINE  Completed   DEXA SCAN  Completed   Zoster Vaccines- Shingrix  Completed   HPV VACCINES  Aged Out    Physical Exam: Vitals:   05/11/21 1012  BP: (!) 134/92  Pulse: 82  Temp: 98.2 F (36.8 C)  SpO2: 98%  Weight: 135 lb 9.6 oz (61.5 kg)  Height: 5\' 1"  (1.549 m)   Body mass index is 25.62 kg/m. Physical Exam Constitutional: Oriented to person, place, and time. Well-developed and well-nourished.  HENT:  Head: Normocephalic.  Mouth/Throat: Oropharynx is clear and moist.  Eyes: Pupils are equal, round, and reactive to light.  Neck: Neck supple.  Cardiovascular: Normal rate and normal heart sounds.  No murmur heard. Pulmonary/Chest: Effort normal and breath sounds normal. No respiratory distress. No wheezes. She has no rales.  Abdominal: Soft. Bowel sounds are normal. No distension. There is no tenderness. There is no rebound.  Musculoskeletal: Left Leg is swollen. Knee is swollen but not tender or Painful Not red or warm  Lymphadenopathy: none Neurological: Alert and oriented to person, place, and time.  Skin: Skin is warm and dry.  Psychiatric: Normal mood and affect. Behavior is normal. Thought content normal.   Labs reviewed: Basic Metabolic Panel: Recent Labs    09/02/20 0000 09/07/20 0000 02/01/21 0000  NA  --  141 141  K  --  4.1 3.9  CL  --  104 104  CO2  --  22 27*  BUN  --  17 13  CREATININE  --  0.7 0.8  CALCIUM 9.9  --  9.5   Liver Function Tests: No results for input(s): AST, ALT, ALKPHOS, BILITOT, PROT, ALBUMIN in the last 8760 hours. No results for input(s): LIPASE, AMYLASE in the last 8760 hours. No results for input(s): AMMONIA in the last 8760 hours. CBC: Recent Labs    12/14/20 0000  WBC 3.4  HGB 12.8  HCT 38  PLT 178   Lipid Panel: Recent Labs    09/07/20 0000  12/14/20 0000  CHOL 245* 239*  HDL 80* 78*  LDLCALC 147 149  TRIG 87 59   Lab Results  Component Value Date   HGBA1C 5.4 10/29/2014    Procedures since last visit: US Guided Needle Placement  Result Date: 04/13/2021 INDICATION: Recurrent symptomatic left popliteal fossa Baker's cyst. Status post prior aspiration and therapeutic injection on 05/12/2020 with significant symptom relief. EXAM: ULTRASOUND-GUIDED ASPIRATION AND THERAPEUTIC INJECTION OF LEFT POPLITEAL FOSSA BAKER'S CYST MEDICATIONS: No oral or intravenous medications were administered. ANESTHESIA/SEDATION: None COMPLICATIONS: None immediate. PROCEDURE: Informed written consent was obtained from the patient after a thorough discussion of the procedural risks, benefits and alternatives. All questions were addressed. A timeout was performed prior to the initiation of the procedure. Ultrasound was used to localize a left popliteal fossa Baker's cyst. The overlying skin was prepped with chlorhexidine.  Local anesthesia was provided with 1% lidocaine. An 18 gauge spinal needle was introduced into the cyst under direct ultrasound guidance and an ultrasound image recorded. Aspiration was performed with removal of 2.5 mL of clear, viscous fluid. Ultrasound demonstrates complete decompression of the cyst. Therapeutic injection was performed with a solution of 80 mg Depo-Medrol and 2 mL 0.5% Sensorcaine. After injection, the needle was removed and the popliteal fossa gently massaged. IMPRESSION: Aspiration and therapeutic injection of a recurrent and symptomatic left popliteal fossa Baker's cyst. Electronically Signed   By: Aletta Edouard M.D.   On: 04/13/2021 10:17    Assessment/Plan 1. Acute pain of left knee Referal to Ortho  2. Baker's cyst of knee, left Follow up with Ortho  3. Essential hypertension, benign Not on any Meds  4. Localized swelling of left lower leg I did not see localized swelling today She will see Ortho  5.  Senile osteoporosis On Prolia DEXA done in 2021  6. Bilateral temporomandibular joint pain TMJ She does not want anymore work up right now  7. Pure hypercholesterolemia HDL is good level. LDL has been going up  D/w Patient to try some Exercise and Diet modification    Labs/tests ordered:  * No order type specified * Next appt:  06/16/2021

## 2021-05-31 DIAGNOSIS — M7052 Other bursitis of knee, left knee: Secondary | ICD-10-CM | POA: Diagnosis not present

## 2021-05-31 DIAGNOSIS — M25562 Pain in left knee: Secondary | ICD-10-CM | POA: Diagnosis not present

## 2021-05-31 DIAGNOSIS — M6281 Muscle weakness (generalized): Secondary | ICD-10-CM | POA: Diagnosis not present

## 2021-06-16 ENCOUNTER — Ambulatory Visit (INDEPENDENT_AMBULATORY_CARE_PROVIDER_SITE_OTHER): Payer: Medicare PPO

## 2021-06-16 ENCOUNTER — Other Ambulatory Visit: Payer: Self-pay

## 2021-06-16 DIAGNOSIS — M810 Age-related osteoporosis without current pathological fracture: Secondary | ICD-10-CM | POA: Diagnosis not present

## 2021-06-16 MED ORDER — DENOSUMAB 60 MG/ML ~~LOC~~ SOSY
60.0000 mg | PREFILLED_SYRINGE | Freq: Once | SUBCUTANEOUS | Status: AC
  Administered 2021-06-16: 09:00:00 60 mg via SUBCUTANEOUS

## 2021-06-17 ENCOUNTER — Telehealth: Payer: Self-pay | Admitting: *Deleted

## 2021-06-17 NOTE — Telephone Encounter (Signed)
Patient called and stated that she has an upcoming appointment 06/27/2021 for 6 Month follow up with Labs with [facility] but patient stated that she does not have a lab appointment scheduled.   Patient wants you to call with a date she needs to get labs done at [facility]. Stated that a message can be left on Voicemail if she does not answer.

## 2021-06-17 NOTE — Telephone Encounter (Signed)
Called patient. Patient states she hasn't had any recent blood work nor an appointment to have labs.   Advised patient to contact [place] nurse  since she was scheduled for follow up "with labs" and if no labs were ordered to call me back to confirm with Dr. Lyndel Safe.

## 2021-06-21 ENCOUNTER — Other Ambulatory Visit: Payer: Self-pay | Admitting: Adult Health

## 2021-06-21 DIAGNOSIS — I10 Essential (primary) hypertension: Secondary | ICD-10-CM

## 2021-06-21 LAB — CBC: RBC: 4.08 (ref 3.87–5.11)

## 2021-06-21 LAB — BASIC METABOLIC PANEL
BUN: 16 (ref 4–21)
CO2: 24 — AB (ref 13–22)
Chloride: 104 (ref 99–108)
Creatinine: 0.8 (ref 0.5–1.1)
Glucose: 99
Potassium: 3.9 (ref 3.4–5.3)
Sodium: 145 (ref 137–147)

## 2021-06-21 LAB — HEPATIC FUNCTION PANEL
ALT: 25 (ref 7–35)
AST: 26 (ref 13–35)
Alkaline Phosphatase: 54 (ref 25–125)

## 2021-06-21 LAB — COMPREHENSIVE METABOLIC PANEL
Albumin: 4 (ref 3.5–5.0)
Calcium: 9.1 (ref 8.7–10.7)
Globulin: 2

## 2021-06-21 LAB — CBC AND DIFFERENTIAL
HCT: 37 (ref 36–46)
Hemoglobin: 12.3 (ref 12.0–16.0)
Platelets: 176 (ref 150–399)
WBC: 3.3

## 2021-06-21 LAB — LIPID PANEL
Cholesterol: 231 — AB (ref 0–200)
HDL: 74 — AB (ref 35–70)
LDL Cholesterol: 141
LDl/HDL Ratio: 3.1
Triglycerides: 80 (ref 40–160)

## 2021-06-21 LAB — VITAMIN D 25 HYDROXY (VIT D DEFICIENCY, FRACTURES): Vit D, 25-Hydroxy: 58.2

## 2021-06-21 LAB — TSH: TSH: 2.63 (ref 0.41–5.90)

## 2021-06-27 ENCOUNTER — Encounter: Payer: Self-pay | Admitting: Adult Health

## 2021-06-27 ENCOUNTER — Other Ambulatory Visit: Payer: Self-pay

## 2021-06-27 ENCOUNTER — Non-Acute Institutional Stay: Payer: Medicare (Managed Care) | Admitting: Adult Health

## 2021-06-27 VITALS — BP 122/88 | HR 66 | Temp 99.1°F | Ht 61.0 in | Wt 134.6 lb

## 2021-06-27 DIAGNOSIS — M810 Age-related osteoporosis without current pathological fracture: Secondary | ICD-10-CM | POA: Diagnosis not present

## 2021-06-27 DIAGNOSIS — M7122 Synovial cyst of popliteal space [Baker], left knee: Secondary | ICD-10-CM

## 2021-06-27 DIAGNOSIS — E7800 Pure hypercholesterolemia, unspecified: Secondary | ICD-10-CM

## 2021-06-27 DIAGNOSIS — R2242 Localized swelling, mass and lump, left lower limb: Secondary | ICD-10-CM | POA: Diagnosis not present

## 2021-06-27 DIAGNOSIS — I10 Essential (primary) hypertension: Secondary | ICD-10-CM | POA: Diagnosis not present

## 2021-06-27 NOTE — Progress Notes (Addendum)
Location: [facility]   POS: Clinic  Provider:  Cindi Carbon, [place] [phone]   Code Status: DNR Goals of Care:  Advanced Directives 06/27/2021  Does Patient Have a Medical Advance Directive? Yes  Type of Paramedic of [place];Living will;Out of facility DNR (pink MOST or yellow form)  Does patient want to make changes to medical advance directive? No - Patient declined  Copy of [place] in Chart? Yes - validated most recent copy scanned in chart (See row information)  Pre-existing out of facility DNR order (yellow form or pink MOST form) -     Chief Complaint  Patient presents with   Medical Management of Chronic Issues    Patient returns to the the clinic for 6 month follow up. Patient would like to discus her left leg issues.     HPI: Patient is a 81 y.o. female seen today for medical management of chronic diseases.  PMH significant for leg edema, baker cysts left leg, HTN, PVCs, post menopausal, OP, FH colon ca.  Left knee is bothering her more with pain. Hurts to walk and kneeling are causing pain. Last aspiration 04/13/21  of the left knee. She found immediate improvement afterwards.   She has not been exercising due to knee pain. She is working with PT now for knee pain. She went to ortho in Dec and they xrayed her knee and did not find a fracture. The note indicates that she needs PT for quad strength and that PCP can follow her and order knee aspirations when needed.   OP: T score -2.7 11/27/19 on prolia. 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 She goes to [facility] for care regarding this issue.  Past Medical History:  Diagnosis Date   Allergy    Ankle fracture, left    Baker cyst 04/19/2020   Bigeminy    Cataract    Colon polyps    Diverticulosis    FH: colon cancer    Menopausal and perimenopausal disorder    Multiple allergies    Osteoporosis 2008    PER DEXA SCAN   PVC (premature ventricular contraction)     Past Surgical History:  Procedure Laterality Date   APPENDECTOMY     CATARACT EXTRACTION W/ INTRAOCULAR LENS  IMPLANT, BILATERAL     IR US GUIDE BX ASP/DRAIN  05/12/2020   PERFORATED APPENDIX     SMALL BOWEL OBSTRUCTION      Allergies  Allergen Reactions   Pollen Extract     Outpatient Encounter Medications as of 06/27/2021  Medication Sig   Calcium Carb-Ergocalciferol 500-200 MG-UNIT TABS Take 2 tablets by mouth daily.    Cholecalciferol (VITAMIN D3) 2000 units capsule Take 2,000 Units by mouth daily.   denosumab (PROLIA) 60 MG/ML SOLN injection Inject 60 mg into the skin every 6 (six) months. Administer in upper arm, thigh, or abdomen   losartan (COZAAR) 25 MG tablet Take 1 tablet (25 mg total) by mouth daily.   NON FORMULARY Antronex dietary supplement 1 per day   NON FORMULARY Seneplex take 1 tablet once daily   NON FORMULARY Bio-C-Plus, take tablet by mouth once daily   vitamin B-12 (CYANOCOBALAMIN) 1000 MCG tablet Take 1,000 mcg by mouth daily.   No facility-administered encounter medications on file as of 06/27/2021.    Review of Systems:  Review of Systems  Constitutional:  Positive for activity change. Negative  for appetite change, chills, diaphoresis, fatigue, fever and unexpected weight change.  HENT:  Negative for congestion.   Respiratory:  Negative for cough, shortness of breath and wheezing.   Cardiovascular:  Positive for leg swelling. Negative for chest pain and palpitations.  Gastrointestinal:  Negative for abdominal distention, abdominal pain, constipation and diarrhea.  Genitourinary:  Negative for difficulty urinating and dysuria.  Musculoskeletal:  Positive for joint swelling. Negative for arthralgias, back pain, gait problem and myalgias.  Neurological:  Negative for dizziness, tremors, seizures, syncope, facial asymmetry, speech difficulty, weakness, light-headedness, numbness and headaches.   Psychiatric/Behavioral:  Negative for agitation, behavioral problems and confusion.    Health Maintenance  Topic Date Due   COVID-19 Vaccine (4 - Booster for Moderna series) 06/27/2021 (Originally 05/27/2021)   TETANUS/TDAP  09/17/2021   MAMMOGRAM  12/21/2021   Pneumonia Vaccine 65+ Years old  Completed   INFLUENZA VACCINE  Completed   DEXA SCAN  Completed   Zoster Vaccines- Shingrix  Completed   HPV VACCINES  Aged Out   COLONOSCOPY (Pts 45-100yrs Insurance coverage will need to be confirmed)  Discontinued    Physical Exam: Vitals:   06/27/21 1320  BP: 122/88  Pulse: 66  Temp: 99.1 F (37.3 C)  SpO2: 99%  Weight: 134 lb 9.6 oz (61.1 kg)  Height: 5\' 1"  (1.549 m)   Body mass index is 25.43 kg/m. Physical Exam Vitals and nursing note reviewed.  Constitutional:      General: She is not in acute distress.    Appearance: She is not diaphoretic.  HENT:     Head: Normocephalic and atraumatic.  Neck:     Vascular: No JVD.  Cardiovascular:     Rate and Rhythm: Normal rate and regular rhythm.     Heart sounds: No murmur heard. Pulmonary:     Effort: Pulmonary effort is normal. No respiratory distress.     Breath sounds: Normal breath sounds. No wheezing.  Musculoskeletal:        General: Swelling (LE ext. No tenderness or erythema. Varicose veins noted to BLE) present.     Cervical back: No rigidity or tenderness.     Right knee: Normal.     Left knee: Swelling present. No erythema, ecchymosis, bony tenderness or crepitus. Normal range of motion. No tenderness. Normal alignment and normal meniscus.     Right lower leg: No edema.     Left lower leg: Edema (+1) present.  Lymphadenopathy:     Cervical: No cervical adenopathy.  Skin:    General: Skin is warm and dry.  Neurological:     Mental Status: She is alert and oriented to person, place, and time.    Labs reviewed: Basic Metabolic Panel: Recent Labs    09/02/20 0000 09/07/20 0000 02/01/21 0000 06/21/21 0000   NA  --  141 141 145  K  --  4.1 3.9 3.9  CL  --  104 104 104  CO2  --  22 27* 24*  BUN  --  17 13 16   CREATININE  --  0.7 0.8 0.8  CALCIUM 9.9  --  9.5 9.1  TSH  --   --   --  2.63   Liver Function Tests: Recent Labs    06/21/21 0000  AST 26  ALT 25  ALKPHOS 54  ALBUMIN 4.0   No results for input(s): LIPASE, AMYLASE in the last 8760 hours. No results for input(s): AMMONIA in the last 8760 hours. CBC: Recent Labs    12/14/20  0000 06/21/21 0000  WBC 3.4 3.3  HGB 12.8 12.3  HCT 38 37  PLT 178 176   Lipid Panel: Recent Labs    09/07/20 0000 12/14/20 0000 06/21/21 0000  CHOL 245* 239* 231*  HDL 80* 78* 74*  LDLCALC 147 149 141  TRIG 87 59 80   Lab Results  Component Value Date   HGBA1C 5.4 10/29/2014    Procedures since last visit: No results found.  Assessment/Plan  1. Baker's cyst of knee, left Worsening pain and swelling at the left knee - US Guided Needle Placement; Future  2. Localized swelling of left lower leg She has chronic edema to the left leg that is unchanged. She is going to try thigh high compression hose rather then knee high. Her edema is likely due to venous insuff. If this is worsening we can consider a diuretic.   3. Senile osteoporosis Continue prolia Continue Ca and Vit D Weight bearing exercise is recommended once her knee improves.  Labs q 51months. Dexa due June 2023  4. Essential hypertension, benign Controlled.   5. Pure hypercholesterolemia LDL is elevated. Not currently on meds. No risk factors. Exercise is recommended 3-5 times a week when able. Monitor intake with reduce fats and carbs.   Labs/tests ordered:  * No order type specified * lipid CBC BMP next visit.  Next appt:  12/16/2021  Total time 20min:  time greater than 50% of total time spent doing pt counseling and coordination of care

## 2021-06-29 ENCOUNTER — Telehealth: Payer: Self-pay | Admitting: *Deleted

## 2021-06-29 NOTE — Telephone Encounter (Signed)
Yes aspiration and injection please. Thanks.

## 2021-06-29 NOTE — Telephone Encounter (Signed)
LM on Vickie's Voicemail with Christy's response.

## 2021-06-29 NOTE — Telephone Encounter (Signed)
Traci Woodard with [facility] called and stated that she has received the order for Baker's Cyst of Left Knee.   Stated that she needs to make sure you want Aspiration and Injecting of Cortisone also. Stated that is the normal procedure but it wasn't on the order so she wanted to make sure.   Please Advise.

## 2021-07-05 ENCOUNTER — Ambulatory Visit
Admission: RE | Admit: 2021-07-05 | Discharge: 2021-07-05 | Disposition: A | Discharge status: Home / Self Care | Payer: Medicare (Managed Care) | Source: Ambulatory Visit | Attending: Adult Health | Admitting: Adult Health

## 2021-07-05 DIAGNOSIS — M7122 Synovial cyst of popliteal space [Baker], left knee: Secondary | ICD-10-CM

## 2021-07-09 ENCOUNTER — Encounter (HOSPITAL_BASED_OUTPATIENT_CLINIC_OR_DEPARTMENT_OTHER): Payer: Self-pay

## 2021-07-09 ENCOUNTER — Other Ambulatory Visit: Payer: Self-pay

## 2021-07-09 ENCOUNTER — Emergency Department (HOSPITAL_BASED_OUTPATIENT_CLINIC_OR_DEPARTMENT_OTHER)
Admission: EM | Admit: 2021-07-09 | Discharge: 2021-07-09 | Disposition: A | Discharge status: Home / Self Care | Payer: Medicare (Managed Care) | Attending: Emergency Medicine | Admitting: Emergency Medicine

## 2021-07-09 ENCOUNTER — Emergency Department (HOSPITAL_BASED_OUTPATIENT_CLINIC_OR_DEPARTMENT_OTHER): Payer: Medicare (Managed Care) | Admitting: Radiology

## 2021-07-09 DIAGNOSIS — Y9301 Activity, walking, marching and hiking: Secondary | ICD-10-CM | POA: Diagnosis not present

## 2021-07-09 DIAGNOSIS — S96912A Strain of unspecified muscle and tendon at ankle and foot level, left foot, initial encounter: Secondary | ICD-10-CM | POA: Insufficient documentation

## 2021-07-09 DIAGNOSIS — S99912A Unspecified injury of left ankle, initial encounter: Secondary | ICD-10-CM | POA: Diagnosis present

## 2021-07-09 DIAGNOSIS — X501XXA Overexertion from prolonged static or awkward postures, initial encounter: Secondary | ICD-10-CM | POA: Insufficient documentation

## 2021-07-09 MED ORDER — IBUPROFEN 800 MG PO TABS
800.0000 mg | ORAL_TABLET | Freq: Once | ORAL | Status: AC
  Administered 2021-07-09: 800 mg via ORAL
  Filled 2021-07-09: qty 1

## 2021-07-09 NOTE — Discharge Instructions (Signed)
Xray does not show fracture. I suspect you sprained your ankle. Take tylenol for pain. Elevated the ankle at rest and ice for the next 3 days. Continue taking pain medicine as needed and icing as needed. Follow up with your doctor if no improvement after a week.  Return if things significantly worsen.

## 2021-07-09 NOTE — ED Triage Notes (Signed)
Patient here POV from Home with Ankle Injury.  Patient states she twisted her Left Ankle approximately at 1100. Swelling noted to Same. No Head Injury.  NAD Noted during Triage. A&Ox4. GCS 15. BIB Wheelchair.

## 2021-07-09 NOTE — ED Provider Notes (Signed)
[facility] DEPT Provider Note   CSN: [id number] Arrival date & time: 07/09/21  1241     History  Chief Complaint  Patient presents with   Ankle Injury    Traci Woodard is a 81 y.o. female.  HPI  Patient presents with pain to the left ankle.  Happened acutely when she was walking, she twisted her ankle and has had pain since then.  Denies any pain at rest, hurts when she bears weight.  Is able to bear weight although elicits significant pain.  History of previous fracture in 2009 to the left ankle.  Its been swollen, has taken Tylenol for pain thus far.  Home Medications Prior to Admission medications   Medication Sig Start Date End Date Taking? Authorizing Provider  Calcium Carb-Ergocalciferol 500-200 MG-UNIT TABS Take 2 tablets by mouth daily.     [provider]  Cholecalciferol (VITAMIN D3) 2000 units capsule Take 2,000 Units by mouth daily.    [provider]  denosumab (PROLIA) 60 MG/ML SOLN injection Inject 60 mg into the skin every 6 (six) months. Administer in upper arm, thigh, or abdomen    [provider]  losartan (COZAAR) 25 MG tablet Take 1 tablet (25 mg total) by mouth daily. 06/21/21   Virgie Dad, MD  NON FORMULARY Antronex dietary supplement 1 per day    [provider]  NON FORMULARY Seneplex take 1 tablet once daily    [provider]  NON FORMULARY Bio-C-Plus, take tablet by mouth once daily    [provider]  vitamin B-12 (CYANOCOBALAMIN) 1000 MCG tablet Take 1,000 mcg by mouth daily.    [provider]      Allergies    Pollen extract    Review of Systems   Review of Systems  Musculoskeletal:  Positive for joint swelling and myalgias.   Physical Exam Updated Vital Signs BP (!) 182/81 (BP Location: Right Arm)    Pulse 62    Temp 98 F (36.7 C)    Resp 18    SpO2 100%  Physical Exam Vitals and nursing note reviewed. Exam conducted with a chaperone present.   Constitutional:      General: She is not in acute distress.    Appearance: Normal appearance.  HENT:     Head: Normocephalic and atraumatic.  Eyes:     General: No scleral icterus.    Extraocular Movements: Extraocular movements intact.     Pupils: Pupils are equal, round, and reactive to light.  Cardiovascular:     Pulses: Normal pulses.     Comments: DP and PT 2+ Musculoskeletal:        General: Swelling and tenderness present.     Comments: Patient is able to tolerate passive flexion extension inversion and eversion of the left ankle.  No point tenderness, somewhat limited active secondary to pain.  Swelling surrounding the left ankle circumferentially.  Skin:    Capillary Refill: Capillary refill takes less than 2 seconds.     Coloration: Skin is not jaundiced.     Findings: No erythema.  Neurological:     Mental Status: She is alert. Mental status is at baseline.     Coordination: Coordination normal.    ED Results / Procedures / Treatments   Labs (all labs ordered are listed, but only abnormal results are displayed) Labs Reviewed - No data to display  EKG None  Radiology DG Ankle Complete Left  Result Date: 07/09/2021 CLINICAL DATA:  Fall.  Lateral ankle pain. EXAM: LEFT ANKLE COMPLETE - 3+ VIEW COMPARISON:  02/17/2008 FINDINGS: No evidence for an acute fracture. No subluxation or dislocation. Deformity of the distal fibula is compatible with healed fracture seen acute phase on the 2009 exam. No worrisome lytic or sclerotic osseous abnormality. Soft tissue swelling noted at the ankle. IMPRESSION: 1. No acute bony abnormality. 2. 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 3. Soft tissue swelling at the ankle. Electronically Signed   By: Misty Stanley M.D.   On: 07/09/2021 15:19    Procedures Procedures    Medications Ordered in ED Medications  ibuprofen (ADVIL) tablet 800 mg (800 mg Oral Given 07/09/21 1624)    ED Course/ Medical Decision Making/ A&P                            Medical Decision Making Amount and/or Complexity of Data Reviewed External Data Reviewed: radiology.    Details: 2009 x-ray L ankle compared. Radiology: ordered. Decision-making details documented in ED Course.  Risk OTC drugs. Prescription drug management. Decision regarding hospitalization.   81 year old female presenting with left ankle pain.  Neurovascularly intact, able to tolerate passive range.  Good cap refill, strong DP and PT.  There is some circumferential swelling surrounding the left ankle.  Given the mechanism I do not suspect this is an infectious process.  X-ray ordered in triage to evaluate for fracture/dislocation was reviewed by myself personally and agree with radiologist interpretation.  Soft tissue swelling but no acute fracture.  Evidence of prior fracture from 2009 evident.  Patient discharged in stable condition, advised to follow-up with orthopedic as needed.        Final Clinical Impression(s) / ED Diagnoses Final diagnoses:  Left ankle strain, initial encounter    Rx / DC Orders ED Discharge Orders     None         Sherrill Raring, PA-C 07/09/21 2332    Lorelle Gibbs, DO 07/10/21 1515

## 2021-08-23 ENCOUNTER — Telehealth: Payer: Self-pay

## 2021-08-23 NOTE — Telephone Encounter (Signed)
It is fine with me. Just send me the order and I will approve it ?

## 2021-08-23 NOTE — Telephone Encounter (Signed)
Dr. Lyndel Safe, ?Autumn with [facility] called for the patient, requesting bone density to be scheduled where she previously had it because her insurance isn't covering it at [facility]. She would like it scheduled at [facility]. Lattie Haw just need to order in.  ?Thanks.  ?

## 2021-08-24 ENCOUNTER — Other Ambulatory Visit: Payer: Self-pay | Admitting: Internal Medicine

## 2021-08-24 DIAGNOSIS — M810 Age-related osteoporosis without current pathological fracture: Secondary | ICD-10-CM

## 2021-08-24 NOTE — Telephone Encounter (Signed)
I have placed the order in [facility] ?

## 2021-08-24 NOTE — Telephone Encounter (Signed)
Sorry for the confusion, I don't believe Lattie Haw can put in the order. Can you drop the order, please. To Dr. Lyndel Safe ?

## 2021-08-24 NOTE — Progress Notes (Signed)
I have placed the order in [facility] where she had it before ?

## 2021-08-31 ENCOUNTER — Encounter: Payer: Self-pay | Admitting: Family

## 2021-08-31 ENCOUNTER — Other Ambulatory Visit: Payer: Self-pay

## 2021-08-31 ENCOUNTER — Other Ambulatory Visit: Payer: Self-pay | Admitting: Family

## 2021-08-31 ENCOUNTER — Ambulatory Visit (INDEPENDENT_AMBULATORY_CARE_PROVIDER_SITE_OTHER): Payer: Medicare (Managed Care) | Admitting: Family

## 2021-08-31 VITALS — BP 110/76 | HR 71 | Temp 96.8°F | Resp 16 | Ht 61.0 in | Wt 136.0 lb

## 2021-08-31 DIAGNOSIS — M1712 Unilateral primary osteoarthritis, left knee: Secondary | ICD-10-CM

## 2021-08-31 DIAGNOSIS — M7122 Synovial cyst of popliteal space [Baker], left knee: Secondary | ICD-10-CM

## 2021-08-31 DIAGNOSIS — M25562 Pain in left knee: Secondary | ICD-10-CM

## 2021-08-31 NOTE — Progress Notes (Signed)
? ?Provider: Marlowe Sax FNP-C ? ?Traci Dad, Traci Woodard ? ?Patient Care Team: ?Traci Dad, Traci Woodard as PCP - General (Internal Medicine) ?Druscilla Brownie, Traci Woodard as Consulting Physician (Dermatology) ?Garlan Fair, Traci Woodard as Consulting Physician (Gastroenterology) ?Mosetta Anis, Traci Woodard as Referring Physician (Allergy) ?Sueanne Margarita, Traci Woodard as Consulting Physician (Cardiology) ? ?Extended Emergency Contact Information ?Primary Emergency Contact: Bernards,Doug ?Address: [address] ?         [place], [place] [address] [place] ?Home Phone: [phone] ?Work Phone: [phone] ?Mobile Phone: [phone] ?Relation: Spouse ? ?Code Status:  DNR ?Goals of care: Advanced Directive information ?Advanced Directives 08/31/2021  ?Does Patient Have a Medical Advance Directive? Yes  ?Type of Paramedic of [place];Out of facility DNR (pink MOST or yellow form)  ?Does patient want to make changes to medical advance directive? No - Patient declined  ?Copy of [place] in Chart? Yes - validated most recent copy scanned in chart (See row information)  ?Would patient like information on creating a medical advance directive? -  ?Pre-existing out of facility DNR order (yellow form or pink MOST form) -  ? ? ? ?Chief Complaint  ?Patient presents with  ? Acute Visit  ?  Patient complains of possible cyst on Left Knee.  ? ? ?HPI:  ?Pt is a 81 y.o. female seen today for an acute visit for evaluation of possible cyst on left knee.Has had similar symptoms x 3 in the past. ?Left leg more swollen than right leg since 2021.  Usually wears thigh-high compression stockings but she left them off today. She denies any worsening swelling or redness.  Left leg not warmer than the right. ?Fell in January,2023 and twisted left foot.she was evaluated in urgent care.pain resolved.  ? ? ?Past Medical History:  ?Diagnosis Date  ? Allergy   ? Ankle fracture, left   ? Baker cyst 04/19/2020  ?  Bigeminy   ? Cataract   ? Colon polyps   ? Diverticulosis   ? FH: colon cancer   ? Menopausal and perimenopausal disorder   ? Multiple allergies   ? Osteoporosis 2008  ? PER DEXA SCAN  ? PVC (premature ventricular contraction)   ? ?Past Surgical History:  ?Procedure Laterality Date  ? APPENDECTOMY    ? CATARACT EXTRACTION W/ INTRAOCULAR LENS  IMPLANT, BILATERAL    ? IR US GUIDE BX ASP/DRAIN  05/12/2020  ? PERFORATED APPENDIX    ? SMALL BOWEL OBSTRUCTION    ? ? ?Allergies  ?Allergen Reactions  ? Pollen Extract   ? ? ?Outpatient Encounter Medications as of 08/31/2021  ?Medication Sig  ? Calcium Carb-Ergocalciferol 500-200 MG-UNIT TABS Take 2 tablets by mouth daily.   ? Cholecalciferol (VITAMIN D3) 2000 units capsule Take 2,000 Units by mouth daily.  ? denosumab (PROLIA) 60 MG/ML SOLN injection Inject 60 mg into the skin every 6 (six) months. Administer in upper arm, thigh, or abdomen  ? losartan (COZAAR) 25 MG tablet Take 1 tablet (25 mg total) by mouth daily.  ? NON FORMULARY Antronex dietary supplement 1 per day  ? NON FORMULARY Seneplex take 1 tablet once daily  ? NON FORMULARY Bio-C-Plus, take tablet by mouth once daily  ? vitamin B-12 (CYANOCOBALAMIN) 1000 MCG tablet Take 1,000 mcg by mouth daily.  ? ?No facility-administered encounter medications on file as of 08/31/2021.  ? ? ?Review of Systems  ?Constitutional:  Negative for appetite change, chills, fatigue, fever and unexpected weight change.  ?  Respiratory:  Negative for cough, chest tightness, shortness of breath and wheezing.   ?Cardiovascular:  Negative for chest pain, palpitations and leg swelling.  ?Musculoskeletal:  Positive for arthralgias. Negative for back pain, joint swelling, myalgias, neck pain and neck stiffness.  ?     Swelling behind the left knee  ?Skin:  Negative for color change, pallor, rash and wound.  ?Neurological:  Negative for dizziness, syncope, speech difficulty, weakness, light-headedness, numbness and headaches.   ?Psychiatric/Behavioral:  Negative for agitation, behavioral problems, confusion, hallucinations, self-injury, sleep disturbance and suicidal ideas. The patient is not nervous/anxious.   ? ?Immunization History  ?Administered Date(s) Administered  ? Influenza, High Dose Seasonal PF 01/18/2015, 03/28/2019, 04/16/2020  ? Influenza,inj,Quad PF,6+ Mos 04/24/2016, 04/12/2018  ? Influenza-Unspecified 04/09/2017, 04/16/2020, 04/08/2021  ? Moderna SARS-COV2 Booster Vaccination 10/19/2020, 04/01/2021  ? Moderna Sars-Covid-2 Vaccination 07/01/2019, 07/29/2019, 05/04/2020  ? Pneumococcal Conjugate-13 09/22/2013  ? Pneumococcal Polysaccharide-23 09/02/2004, 09/18/2011  ? Tdap 09/18/2011  ? Zoster Recombinat (Shingrix) 08/21/2017, 11/12/2017  ? Zoster, Live 08/19/2008  ? ?Pertinent  Health Maintenance Due  ?Topic Date Due  ? MAMMOGRAM  12/21/2021  ? INFLUENZA VACCINE  Completed  ? DEXA SCAN  Completed  ? COLONOSCOPY (Pts 45-64yr Insurance coverage will need to be confirmed)  Discontinued  ? ?Fall Risk 04/01/2021 05/11/2021 06/27/2021 07/09/2021 08/31/2021  ?Falls in the past year? 0 0 0 - 1  ?Was there an injury with Fall? 0 0 0 - 1  ?Fall Risk Category Calculator 0 0 0 - 2  ?Fall Risk Category Low Low Low - Moderate  ?Patient Fall Risk Level Low fall risk Low fall risk Low fall risk Low fall risk Moderate fall risk  ?Patient at Risk for Falls Due to No Fall Risks No Fall Risks No Fall Risks - History of fall(s)  ?Fall risk Follow up Falls evaluation completed Falls evaluation completed Falls evaluation completed - Falls evaluation completed  ? ?Functional Status Survey: ?  ? ?Vitals:  ? 08/31/21 1434  ?BP: 110/76  ?Pulse: 71  ?Resp: 16  ?Temp: (!) 96.8 ?F (36 ?C)  ?SpO2: 97%  ?Weight: 136 lb (61.7 kg)  ?Height: '5\' 1"'$  (1.549 m)  ? ?Body mass index is 25.7 kg/m?.Marland Kitchen?Physical Exam ?Vitals reviewed.  ?Constitutional:   ?   General: She is not in acute distress. ?   Appearance: Normal appearance. She is normal weight. She is not  ill-appearing or diaphoretic.  ?Cardiovascular:  ?   Rate and Rhythm: Normal rate and regular rhythm.  ?   Pulses: Normal pulses.  ?   Heart sounds: Normal heart sounds. No murmur heard. ?  No friction rub. No gallop.  ?Pulmonary:  ?   Effort: Pulmonary effort is normal. No respiratory distress.  ?   Breath sounds: Normal breath sounds. No wheezing, rhonchi or rales.  ?Chest:  ?   Chest wall: No tenderness.  ?Abdominal:  ?   General: Bowel sounds are normal. There is no distension.  ?   Palpations: Abdomen is soft. There is no mass.  ?   Tenderness: There is no abdominal tenderness. There is no right CVA tenderness, left CVA tenderness, guarding or rebound.  ?Musculoskeletal:     ?   General: No swelling or tenderness. Normal range of motion.  ?   Right knee: No swelling, effusion, erythema, ecchymosis or crepitus. Normal range of motion. No tenderness.  ?   Left knee: No erythema, ecchymosis or crepitus. Normal range of motion.  ?   Comments: Swelling behind  left knee tender to touch. ?Chronic left leg edema  ?Skin: ?   General: Skin is warm and dry.  ?   Coloration: Skin is not pale.  ?   Findings: No erythema or rash.  ?Neurological:  ?   Mental Status: She is alert and oriented to person, place, and time.  ?   Motor: No weakness.  ?   Gait: Gait normal.  ?Psychiatric:     ?   Mood and Affect: Mood normal.     ?   Speech: Speech normal.     ?   Behavior: Behavior normal.  ? ? ?Labs reviewed: ?Recent Labs  ?  09/02/20 ?0000 09/07/20 ?0000 02/01/21 ?0000 06/21/21 ?0000  ?NA  --  141 141 145  ?K  --  4.1 3.9 3.9  ?CL  --  104 104 104  ?CO2  --  22 27* 24*  ?BUN  --  '17 13 16  '$ ?CREATININE  --  0.7 0.8 0.8  ?CALCIUM 9.9  --  9.5 9.1  ? ?Recent Labs  ?  06/21/21 ?0000  ?AST 26  ?ALT 25  ?ALKPHOS 54  ?ALBUMIN 4.0  ? ?Recent Labs  ?  12/14/20 ?0000 06/21/21 ?0000  ?WBC 3.4 3.3  ?HGB 12.8 12.3  ?HCT 38 37  ?PLT 178 176  ? ?Lab Results  ?Component Value Date  ? TSH 2.63 06/21/2021  ? ?Lab Results  ?Component Value Date  ?  HGBA1C 5.4 10/29/2014  ? ?Lab Results  ?Component Value Date  ? CHOL 231 (A) 06/21/2021  ? HDL 74 (A) 06/21/2021  ? [place] 141 06/21/2021  ? TRIG 80 06/21/2021  ? ? ?Significant Diagnostic Results in last 30 days:  ?No results fo

## 2021-09-01 ENCOUNTER — Ambulatory Visit
Admission: RE | Admit: 2021-09-01 | Discharge: 2021-09-01 | Disposition: A | Discharge status: Home / Self Care | Payer: Medicare (Managed Care) | Source: Ambulatory Visit | Attending: Family | Admitting: Family

## 2021-09-27 ENCOUNTER — Encounter: Payer: Self-pay | Admitting: Internal Medicine

## 2021-09-28 ENCOUNTER — Encounter: Payer: Self-pay | Admitting: Internal Medicine

## 2021-09-28 ENCOUNTER — Non-Acute Institutional Stay: Payer: Medicare (Managed Care) | Admitting: Internal Medicine

## 2021-09-28 VITALS — BP 136/92 | HR 62 | Temp 98.8°F | Ht 61.0 in | Wt 136.0 lb

## 2021-09-28 DIAGNOSIS — M1712 Unilateral primary osteoarthritis, left knee: Secondary | ICD-10-CM | POA: Diagnosis not present

## 2021-09-28 DIAGNOSIS — M7122 Synovial cyst of popliteal space [Baker], left knee: Secondary | ICD-10-CM | POA: Diagnosis not present

## 2021-09-28 DIAGNOSIS — E7800 Pure hypercholesterolemia, unspecified: Secondary | ICD-10-CM

## 2021-09-28 DIAGNOSIS — I10 Essential (primary) hypertension: Secondary | ICD-10-CM

## 2021-09-28 DIAGNOSIS — M810 Age-related osteoporosis without current pathological fracture: Secondary | ICD-10-CM

## 2021-09-28 DIAGNOSIS — R2242 Localized swelling, mass and lump, left lower limb: Secondary | ICD-10-CM

## 2021-09-28 DIAGNOSIS — M26623 Arthralgia of bilateral temporomandibular joint: Secondary | ICD-10-CM

## 2021-09-28 DIAGNOSIS — Z636 Dependent relative needing care at home: Secondary | ICD-10-CM

## 2021-09-28 MED ORDER — LOSARTAN POTASSIUM 50 MG PO TABS: 50.0000 mg | ORAL_TABLET | Freq: Every day | ORAL | 3 refills | Status: DC

## 2021-09-28 NOTE — Progress Notes (Signed)
? ?Location: [facility] ?  ?Place of Service:  Clinic (12) ? ?Provider:  ? ?Code Status:  ?Goals of Care:  ? ?  08/31/2021  ?  2:25 PM  ?Advanced Directives  ?Does Patient Have a Medical Advance Directive? Yes  ?Type of Paramedic of [place];Out of facility DNR (pink MOST or yellow form)  ?Does patient want to make changes to medical advance directive? No - Patient declined  ?Copy of [place] in Chart? Yes - validated most recent copy scanned in chart (See row information)  ? ? ? ?Chief Complaint  ?Patient presents with  ? Acute Visit  ?  Patient returns to the clinic for possible cyst on back of knee. Patient states this may be her 5th one.   ? ? ?HPI: Patient is a 81 y.o. female seen today for an acute visit for Recurent Bakers Cyst in her Left knee ? ?Patient has h/o Osteoporosis ON Prolia ?Left Lower leg Swelling since 05/21. [place] was negative Does have h/o Left Ankle Fracture in 2009 ?H/o Left knee Bakers Cyst ?TMJ arthritis ? ?Underwent Baker cyst aspiration in 03/23. This is her 2nd time this year and she feels that pain and swelling again in that knee again. ?She is unable to do much exercise and her Yoga due to pain and discomfort in that Knee. ?She was seen by [facility] in 12/22 and was recommended Therapy ?Xrays of the knee  showed Moderate Arthritis. ? ?BP also elevated today ?Has caregiver and family stress due to her husband has dementia ? ? ?Past Medical History:  ?Diagnosis Date  ? Allergy   ? Ankle fracture, left   ? Baker cyst 04/19/2020  ? Bigeminy   ? Cataract   ? Colon polyps   ? Diverticulosis   ? FH: colon cancer   ? Menopausal and perimenopausal disorder   ? Multiple allergies   ? Osteoporosis 2008  ? PER DEXA SCAN  ? PVC (premature ventricular contraction)   ? ? ?Past Surgical History:  ?Procedure Laterality Date  ? APPENDECTOMY    ? CATARACT EXTRACTION W/ INTRAOCULAR LENS  IMPLANT, BILATERAL    ? IR US GUIDE BX ASP/DRAIN   05/12/2020  ? PERFORATED APPENDIX    ? SMALL BOWEL OBSTRUCTION    ? ? ?Allergies  ?Allergen Reactions  ? Pollen Extract   ? ? ?Outpatient Encounter Medications as of 09/28/2021  ?Medication Sig  ? Calcium Carb-Ergocalciferol 500-200 MG-UNIT TABS Take 2 tablets by mouth daily.   ? Cholecalciferol (VITAMIN D3) 2000 units capsule Take 2,000 Units by mouth daily.  ? denosumab (PROLIA) 60 MG/ML SOLN injection Inject 60 mg into the skin every 6 (six) months. Administer in upper arm, thigh, or abdomen  ? losartan (COZAAR) 50 MG tablet Take 1 tablet (50 mg total) by mouth daily.  ? NON FORMULARY Antronex dietary supplement 1 per day  ? NON FORMULARY Seneplex take 1 tablet once daily  ? NON FORMULARY Bio-C-Plus, take tablet by mouth once daily  ? vitamin B-12 (CYANOCOBALAMIN) 1000 MCG tablet Take 1,000 mcg by mouth daily.  ? [DISCONTINUED] losartan (COZAAR) 25 MG tablet Take 1 tablet (25 mg total) by mouth daily.  ? ?No facility-administered encounter medications on file as of 09/28/2021.  ? ? ?Review of Systems:  ?Review of Systems  ?Constitutional:  Positive for activity change. Negative for appetite change.  ?HENT: Negative.    ?Respiratory:  Negative for cough and shortness of breath.   ?Cardiovascular:  Positive for leg swelling.  ?Gastrointestinal:  Negative for constipation.  ?Genitourinary: Negative.   ?Musculoskeletal:  Positive for arthralgias and joint swelling. Negative for gait problem and myalgias.  ?Skin: Negative.   ?Neurological:  Negative for dizziness and weakness.  ?Psychiatric/Behavioral:  Negative for confusion, dysphoric mood and sleep disturbance.   ? ?Health Maintenance  ?Topic Date Due  ? COVID-19 Vaccine (4 - Booster for Moderna series) 05/27/2021  ? TETANUS/TDAP  09/17/2021  ? MAMMOGRAM  12/21/2021  ? INFLUENZA VACCINE  01/17/2022  ? Pneumonia Vaccine 19+ Years old  Completed  ? DEXA SCAN  Completed  ? Zoster Vaccines- Shingrix  Completed  ? HPV VACCINES  Aged Out  ? COLONOSCOPY (Pts 45-58yr  Insurance coverage will need to be confirmed)  Discontinued  ? ? ?Physical Exam: ?Vitals:  ? 09/28/21 0830  ?BP: (!) 136/92  ?Pulse: 62  ?Temp: 98.8 ?F (37.1 ?C)  ?SpO2: 97%  ?Weight: 136 lb (61.7 kg)  ?Height: '5\' 1"'$  (1.549 m)  ? ?Body mass index is 25.7 kg/m?.Marland Kitchen?Physical Exam ?Vitals reviewed.  ?Constitutional:   ?   Appearance: Normal appearance.  ?HENT:  ?   Head: Normocephalic.  ?   Nose: Nose normal.  ?   Mouth/Throat:  ?   Mouth: Mucous membranes are moist.  ?   Pharynx: Oropharynx is clear.  ?Eyes:  ?   Pupils: Pupils are equal, round, and reactive to light.  ?Cardiovascular:  ?   Rate and Rhythm: Normal rate and regular rhythm.  ?   Pulses: Normal pulses.  ?   Heart sounds: Normal heart sounds. No murmur heard. ?Pulmonary:  ?   Effort: Pulmonary effort is normal.  ?   Breath sounds: Normal breath sounds.  ?Abdominal:  ?   General: Abdomen is flat. Bowel sounds are normal.  ?   Palpations: Abdomen is soft.  ?Musculoskeletal:     ?   General: Swelling present.  ?   Cervical back: Neck supple.  ?   Comments: Left Leg swelling with small bakers Cyst in her Left knee  ?Skin: ?   General: Skin is warm.  ?Neurological:  ?   General: No focal deficit present.  ?   Mental Status: She is alert and oriented to person, place, and time.  ?Psychiatric:     ?   Mood and Affect: Mood normal.     ?   Thought Content: Thought content normal.  ? ? ?Labs reviewed: ?Basic Metabolic Panel: ?Recent Labs  ?  02/01/21 ?0000 06/21/21 ?0000  ?NA 141 145  ?K 3.9 3.9  ?CL 104 104  ?CO2 27* 24*  ?BUN 13 16  ?CREATININE 0.8 0.8  ?CALCIUM 9.5 9.1  ?TSH  --  2.63  ? ?Liver Function Tests: ?Recent Labs  ?  06/21/21 ?0000  ?AST 26  ?ALT 25  ?ALKPHOS 54  ?ALBUMIN 4.0  ? ?No results for input(s): LIPASE, AMYLASE in the last 8760 hours. ?No results for input(s): AMMONIA in the last 8760 hours. ?CBC: ?Recent Labs  ?  12/14/20 ?0000 06/21/21 ?0000  ?WBC 3.4 3.3  ?HGB 12.8 12.3  ?HCT 38 37  ?PLT 178 176  ? ?Lipid Panel: ?Recent Labs  ?   12/14/20 ?0000 06/21/21 ?0000  ?CHOL 239* 231*  ?HDL 78* 74*  ?[place]149 141  ?TRIG 59 80  ? ?Lab Results  ?Component Value Date  ? HGBA1C 5.4 10/29/2014  ? ? ?Procedures since last visit: ?[place]INE NEEDLE ASP 1ST LESION ? ?Result Date: 09/01/2021 ?  INDICATION: 81 year old female with recurrent symptomatic left Baker's cyst EXAM: [place] FINE NEEDLE ASP 1ST LESION MEDICATIONS: 80 mg Depo-Medrol, 1 mL 1% lidocaine ANESTHESIA/SEDATION: None COMPLICATIONS: None immediate. PROCEDURE: Informed written consent was obtained from the patient after a thorough discussion of the procedural risks, benefits and alternatives. All questions were addressed. Maximal Sterile Barrier Technique was utilized including caps, mask, sterile gowns, sterile gloves, sterile drape, hand hygiene and skin antiseptic. A timeout was performed prior to the initiation of the procedure. Popliteal fossa was interrogated with ultrasound. There is a thin elongated Baker's cyst of moderate complexity measuring 3.3 x 0.6 x 0.8 cm. A suitable skin entry site was selected and marked. The overlying skin was sterilely prepped and draped in the standard fashion using chlorhexidine skin prep. Local anesthesia was attained by infiltration with 1% lidocaine. A small dermatotomy was made. Under real-time ultrasound guidance, an 18 gauge trocar needle was advanced into the fluid collection. Aspiration yields several mL of synovial fluid and debris. The steroid and local anesthetic mixture was then injected into the Baker's cyst sac. IMPRESSION: Successful aspiration and steroid injection of left popliteal Baker's cyst. Electronically Signed   By: Jacqulynn Cadet M.D.   On: 09/01/2021 14:13   ? ?Assessment/Plan ?1. Recurent Baker's cyst of knee, left ?Had Aspiration of cyst by radiology recently with Steroid injection ?It has come back again ?Causing her to have pain and discomfort ? ?- Ambulatory referral to Orthopedic Surgery to [facility] ? ?2. Arthritis of left  knee ?Referal to Ortho ? ?3. Essential hypertension, benign ?BP has been elevated now twice visits ?Will increase her Cozaar to 50 mg QD ?She will come in July for her regular exam ? ?4. Senile osteoporosis ?On Prolia ?DEX

## 2021-09-28 NOTE — Patient Instructions (Addendum)
Call [facility] I am sending referral again ?

## 2021-10-10 ENCOUNTER — Non-Acute Institutional Stay: Payer: Medicare (Managed Care) | Admitting: Adult Health

## 2021-10-10 ENCOUNTER — Encounter: Payer: Self-pay | Admitting: Adult Health

## 2021-10-10 VITALS — BP 128/76 | HR 63 | Temp 99.1°F | Ht 61.0 in | Wt 138.0 lb

## 2021-10-10 DIAGNOSIS — Z0000 Encounter for general adult medical examination without abnormal findings: Secondary | ICD-10-CM

## 2021-10-10 DIAGNOSIS — Z23 Encounter for immunization: Secondary | ICD-10-CM

## 2021-10-10 NOTE — Patient Instructions (Signed)
Traci Woodard , ?Thank you for taking time to come for your Medicare Wellness Visit. I appreciate your ongoing commitment to your health goals. Please review the following plan we discussed and let me know if I can assist you in the future.  ? ?Screening recommendations/referrals: ?Colonoscopy no longer required.  ?Mammogram due in July ?Bone Density up in Sept ?Recommended yearly ophthalmology/optometry visit for glaucoma screening and checkup ?Recommended yearly dental visit for hygiene and checkup ? ?Vaccinations: ?Influenza vaccine up to date ?Pneumococcal vaccine up to date ?Tdap vaccine recommended.  ?Shingles vaccine up to date   ? ?Recommend covid booster  ? ?Advanced directives: reviewed  ? ?Conditions/risks identified: fall risk  ? ?Next appointment: 1 year ? ? ?Preventive Care 75 Years and Older, Female ?Preventive care refers to lifestyle choices and visits with your health care provider that can promote health and wellness. ?What does preventive care include? ?A yearly physical exam. This is also called an annual well check. ?Dental exams once or twice a year. ?Routine eye exams. Ask your health care provider how often you should have your eyes checked. ?Personal lifestyle choices, including: ?Daily care of your teeth and gums. ?Regular physical activity. ?Eating a healthy diet. ?Avoiding tobacco and drug use. ?Limiting alcohol use. ?Practicing safe sex. ?Taking low-dose aspirin every day. ?Taking vitamin and mineral supplements as recommended by your health care provider. ?What happens during an annual well check? ?The services and screenings done by your health care provider during your annual well check will depend on your age, overall health, lifestyle risk factors, and family history of disease. ?Counseling  ?Your health care provider may ask you questions about your: ?Alcohol use. ?Tobacco use. ?Drug use. ?Emotional well-being. ?Home and relationship well-being. ?Sexual activity. ?Eating  habits. ?History of falls. ?Memory and ability to understand (cognition). ?Work and work Statistician. ?Reproductive health. ?Screening  ?You may have the following tests or measurements: ?Height, weight, and BMI. ?Blood pressure. ?Lipid and cholesterol levels. These may be checked every 5 years, or more frequently if you are over 38 years old. ?Skin check. ?Lung cancer screening. You may have this screening every year starting at age 57 if you have a 30-pack-year history of smoking and currently smoke or have quit within the past 15 years. ?Fecal occult blood test (FOBT) of the stool. You may have this test every year starting at age 66. ?Flexible sigmoidoscopy or colonoscopy. You may have a sigmoidoscopy every 5 years or a colonoscopy every 10 years starting at age 60. ?Hepatitis C blood test. ?Hepatitis B blood test. ?Sexually transmitted disease (STD) testing. ?Diabetes screening. This is done by checking your blood sugar (glucose) after you have not eaten for a while (fasting). You may have this done every 1-3 years. ?Bone density scan. This is done to screen for osteoporosis. You may have this done starting at age 49. ?Mammogram. This may be done every 1-2 years. Talk to your health care provider about how often you should have regular mammograms. ?Talk with your health care provider about your test results, treatment options, and if necessary, the need for more tests. ?Vaccines  ?Your health care provider may recommend certain vaccines, such as: ?Influenza vaccine. This is recommended every year. ?Tetanus, diphtheria, and acellular pertussis (Tdap, Td) vaccine. You may need a Td booster every 10 years. ?Zoster vaccine. You may need this after age 68. ?Pneumococcal 13-valent conjugate (PCV13) vaccine. One dose is recommended after age 61. ?Pneumococcal polysaccharide (PPSV23) vaccine. One dose is recommended after age 38. ?  Talk to your health care provider about which screenings and vaccines you need and how  often you need them. ?This information is not intended to replace advice given to you by your health care provider. Make sure you discuss any questions you have with your health care provider. ?Document Released: 07/02/2015 Document Revised: 02/23/2016 Document Reviewed: 04/06/2015 ?Elsevier Interactive Patient Education ? 2017 [place]. ? ?Fall Prevention in the Home ?Falls can cause injuries. They can happen to people of all ages. There are many things you can do to make your home safe and to help prevent falls. ?What can I do on the outside of my home? ?Regularly fix the edges of walkways and driveways and fix any cracks. ?Remove anything that might make you trip as you walk through a door, such as a raised step or threshold. ?Trim any bushes or trees on the path to your home. ?Use bright outdoor lighting. ?Clear any walking paths of anything that might make someone trip, such as rocks or tools. ?Regularly check to see if handrails are loose or broken. Make sure that both sides of any steps have handrails. ?Any raised decks and porches should have guardrails on the edges. ?Have any leaves, snow, or ice cleared regularly. ?Use sand or salt on walking paths during winter. ?Clean up any spills in your garage right away. This includes oil or grease spills. ?What can I do in the bathroom? ?Use night lights. ?Install grab bars by the toilet and in the tub and shower. Do not use towel bars as grab bars. ?Use non-skid mats or decals in the tub or shower. ?If you need to sit down in the shower, use a plastic, non-slip stool. ?Keep the floor dry. Clean up any water that spills on the floor as soon as it happens. ?Remove soap buildup in the tub or shower regularly. ?Attach bath mats securely with double-sided non-slip rug tape. ?Do not have throw rugs and other things on the floor that can make you trip. ?What can I do in the bedroom? ?Use night lights. ?Make sure that you have a light by your bed that is easy to  reach. ?Do not use any sheets or blankets that are too big for your bed. They should not hang down onto the floor. ?Have a firm chair that has side arms. You can use this for support while you get dressed. ?Do not have throw rugs and other things on the floor that can make you trip. ?What can I do in the kitchen? ?Clean up any spills right away. ?Avoid walking on wet floors. ?Keep items that you use a lot in easy-to-reach places. ?If you need to reach something above you, use a strong step stool that has a grab bar. ?Keep electrical cords out of the way. ?Do not use floor polish or wax that makes floors slippery. If you must use wax, use non-skid floor wax. ?Do not have throw rugs and other things on the floor that can make you trip. ?What can I do with my stairs? ?Do not leave any items on the stairs. ?Make sure that there are handrails on both sides of the stairs and use them. Fix handrails that are broken or loose. Make sure that handrails are as long as the stairways. ?Check any carpeting to make sure that it is firmly attached to the stairs. Fix any carpet that is loose or worn. ?Avoid having throw rugs at the top or bottom of the stairs. If you do have throw  rugs, attach them to the floor with carpet tape. ?Make sure that you have a light switch at the top of the stairs and the bottom of the stairs. If you do not have them, ask someone to add them for you. ?What else can I do to help prevent falls? ?Wear shoes that: ?Do not have high heels. ?Have rubber bottoms. ?Are comfortable and fit you well. ?Are closed at the toe. Do not wear sandals. ?If you use a stepladder: ?Make sure that it is fully opened. Do not climb a closed stepladder. ?Make sure that both sides of the stepladder are locked into place. ?Ask someone to hold it for you, if possible. ?Clearly mark and make sure that you can see: ?Any grab bars or handrails. ?First and last steps. ?Where the edge of each step is. ?Use tools that help you move  around (mobility aids) if they are needed. These include: ?Canes. ?Walkers. ?Scooters. ?Crutches. ?Turn on the lights when you go into a dark area. Replace any light bulbs as soon as they burn out. ?Set up your furniture so

## 2021-10-10 NOTE — Progress Notes (Signed)
? ?Subjective:  ? Traci Woodard is a 81 y.o. female who presents for Medicare Annual (Subsequent) preventive examination at [facility] ? ?Review of Systems    ? ?Cardiac Risk Factors include: advanced age (>58mn, >>13women);sedentary lifestyle ? ?   ?Objective:  ?  ?Today's Vitals  ? 10/10/21 1430  ?BP: 128/76  ?Pulse: 63  ?Temp: 99.1 ?F (37.3 ?C)  ?SpO2: 97%  ?Weight: 138 lb (62.6 kg)  ?Height: '5\' 1"'$  (1.549 m)  ? ?Body mass index is 26.07 kg/m?. ? ? ?  10/10/2021  ?  2:49 PM 10/10/2021  ?  2:28 PM 08/31/2021  ?  2:25 PM 07/09/2021  ?  2:46 PM 06/27/2021  ?  1:23 PM 05/11/2021  ?  8:47 AM 04/01/2021  ?  3:22 PM  ?Advanced Directives  ?Does Patient Have a Medical Advance Directive? Yes Yes Yes No Yes Yes Yes  ?Type of AParamedicof A[place]velLiving will HTullOut of facility DNR (pink MOST or yellow form) [place] of facility DNR (pink MOST or yellow form)  HCarbonadoLiving will;Out of facility DNR (pink MOST or yellow form) HKentLiving will;Out of facility DNR (pink MOST or yellow form) HLacledeLiving will;Out of facility DNR (pink MOST or yellow form)  ?Does patient want to make changes to medical advance directive?  No - Patient declined No - Patient declined  No - Patient declined No - Patient declined No - Patient declined  ?Copy of [place]in Chart? Yes - validated most recent copy scanned in chart (See row information) Yes - validated most recent copy scanned in chart (See row information) Yes - validated most recent copy scanned in chart (See row information)  Yes - validated most recent copy scanned in chart (See row information) Yes - validated most recent copy scanned in chart (See row information) Yes - validated most recent copy scanned in chart (See row information)  ?Would patient like information on creating a medical advance  directive?    No - Patient declined     ? ? ?Current Medications (verified) ?Outpatient Encounter Medications as of 10/10/2021  ?Medication Sig  ? Calcium Carb-Ergocalciferol 500-200 MG-UNIT TABS Take 2 tablets by mouth daily.   ? Cholecalciferol (VITAMIN D3) 2000 units capsule Take 2,000 Units by mouth daily.  ? denosumab (PROLIA) 60 MG/ML SOLN injection Inject 60 mg into the skin every 6 (six) months. Administer in upper arm, thigh, or abdomen  ? losartan (COZAAR) 50 MG tablet Take 1 tablet (50 mg total) by mouth daily.  ? NON FORMULARY Antronex dietary supplement 1 per day  ? NON FORMULARY Seneplex take 1 tablet once daily  ? NON FORMULARY Bio-C-Plus, take tablet by mouth once daily  ? vitamin B-12 (CYANOCOBALAMIN) 1000 MCG tablet Take 1,000 mcg by mouth daily.  ? ?No facility-administered encounter medications on file as of 10/10/2021.  ? ? ?Allergies (verified) ?Dust mite extract and Pollen extract  ? ?History: ?Past Medical History:  ?Diagnosis Date  ? Allergy   ? Ankle fracture, left   ? Baker cyst 04/19/2020  ? Bigeminy   ? Cataract   ? Colon polyps   ? Diverticulosis   ? FH: colon cancer   ? Menopausal and perimenopausal disorder   ? Multiple allergies   ? Osteoporosis 2008  ? PER DEXA SCAN  ? PVC (premature ventricular contraction)   ? ?Past Surgical History:  ?Procedure Laterality  Date  ? APPENDECTOMY    ? CATARACT EXTRACTION W/ INTRAOCULAR LENS  IMPLANT, BILATERAL    ? IR US GUIDE BX ASP/DRAIN  05/12/2020  ? PERFORATED APPENDIX    ? SMALL BOWEL OBSTRUCTION    ? ?Family History  ?Problem Relation Age of Onset  ? Colon cancer Mother   ? CAD Father   ? Dementia Father   ? Heart disease Father   ? Colon cancer Brother   ? Breast cancer Paternal Aunt   ? ?Social History  ? ?Socioeconomic History  ? Marital status: Married  ?  Spouse name: Not on file  ? Number of children: Not on file  ? Years of education: Not on file  ? Highest education level: Not on file  ?Occupational History  ? 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 Smoking status: Never  ? Smokeless tobacco: Never  ?Vaping Use  ? Vaping Use: Never used  ?Substance and Sexual Activity  ? Alcohol use: Yes  ?  Comment: occassiionally  ? Drug use: No  ? Sexual activity: Not on file  ?Other Topics Concern  ? Not on file  ?Social History Narrative  ? Tobacco use, amount per day now:0  ? Past tobacco use, amount per day:0  ? How many years did you use tobacco:0  ? Alcohol use (drinks per week):7-14  ? Diet:FRUITS, VEGETABLES, YOGURT, LEAN MEAT AND FISH, FEW BREADS AND DESSERTS   ? Do you drink/eat things with caffeine:YES, COFFEE, CHOCOLATE  ? Marital status: MARRIED                               What year were you married? 1980  ? Do you live in a house, apartment, assisted living, condo, trailer, etc.? [place]  ? Is it one or more stories? ONE  ? How many persons live in your home? 2  ? Do you have pets in your home?( please list) NO  ? 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 Do you exercise?   YES                               Type and how often? WALK AND STRETCH DAILY  ? Do you have a living will? YES  ? Do you have a DNR form?  I THINK SO              If not, do you want to discuss one?  ? Do you have signed POA/HPOA forms?  YES                      If so, please bring to you appointment  ? ?Social Determinants of Health  ? ?Financial Resource Strain: Not on file  ?Food Insecurity: Not on file  ?Transportation Needs: Not on file  ?Physical Activity: Not on file  ?Stress: Not on file  ?Social Connections: Not on file  ? ? ?Tobacco Counseling ?Counseling given: Not Answered ? ? ?Clinical Intake: ? ?Pre-visit preparation completed: No ? ?Pain : No/denies pain ? ?  ? ?BMI - recorded: 26.07 ?Nutritional Status: BMI 25 -29 Overweight (not at rest but does have pain with kneeling) ?Diabetes: No ? ?How often do you  need to  have someone help you when you read instructions, pamphlets, or other written materials from your doctor or pharmacy?: 1 - Never ?What is the last grade level you completed in school?: 4 year college ? ?Diabetic?no ? ?Interpreter Needed?: No ? ?Information entered by :: Royal Hawthorn NP ? ? ?Activities of Daily Living ? ?  10/10/2021  ?  2:51 PM  ?In your present state of health, do you have any difficulty performing the following activities:  ?Hearing? 0  ?Vision? 1  ?Difficulty concentrating or making decisions? 0  ?Walking or climbing stairs? 0  ?Dressing or bathing? 0  ?Doing errands, shopping? 0  ?Preparing Food and eating ? N  ?Using the Toilet? N  ?In the past six months, have you accidently leaked urine? N  ?Do you have problems with loss of bowel control? N  ?Managing your Medications? N  ?Managing your Finances? N  ?Housekeeping or managing your Housekeeping? N  ? ? ?Patient Care Team: ?Virgie Dad, MD as PCP - General (Internal Medicine) ?Druscilla Brownie, MD as Consulting Physician (Dermatology) ?Garlan Fair, MD as Consulting Physician (Gastroenterology) ?Mosetta Anis, MD as Referring Physician (Allergy) ?Sueanne Margarita, MD as Consulting Physician (Cardiology) ? ?Indicate any recent Medical Services you may have received from other than [facility] providers in the past year (date may be approximate). ? ?   ?Assessment:  ? This is a routine wellness examination for [place]. ? ?Hearing/Vision screen ?No results found. ? ?Dietary issues and exercise activities discussed: ?Current Exercise Habits: The patient does not participate in regular exercise at present, Exercise limited by: orthopedic condition(s) ? ? Goals Addressed   ? ?  ?  ?  ?  ? This Visit's Progress  ?  Weight (lb) < 125 lb (56.7 kg)   138 lb (62.6 kg)  ?  Would like to eat less at parties and social hr ?  ? ?  ? ?Depression Screen ? ?  10/10/2021  ?  2:50 PM 10/10/2021  ?  2:27 PM 06/17/2020  ?  8:58 AM 05/20/2020  ?  2:54 PM 04/21/2020  ?  10:42 AM 11/12/2019  ? 10:46 AM 09/03/2019  ?  8:36 AM  ?PHQ 2/9 Scores  ?PHQ - 2 Score 0 0 0 0 0 0 0  ?  ?Fall Risk ? ?  10/10/2021  ?  2:50 PM 09/28/2021  ?  8:31 AM 08/31/2021  ?  2:25 PM 06/27/2021  ?  1:24 PM

## 2021-10-10 NOTE — Addendum Note (Signed)
Addended by: Leafy Half C on: 10/10/2021 04:00 PM ? ? Modules accepted: Orders ? ?

## 2021-10-18 DIAGNOSIS — M7052 Other bursitis of knee, left knee: Secondary | ICD-10-CM | POA: Diagnosis not present

## 2021-10-18 DIAGNOSIS — M1712 Unilateral primary osteoarthritis, left knee: Secondary | ICD-10-CM | POA: Diagnosis not present

## 2021-10-24 ENCOUNTER — Ambulatory Visit (INDEPENDENT_AMBULATORY_CARE_PROVIDER_SITE_OTHER): Payer: Medicare (Managed Care) | Admitting: Nurse Practitioner

## 2021-10-24 VITALS — BP 120/78 | HR 62 | Temp 97.7°F | Ht 61.0 in

## 2021-10-24 DIAGNOSIS — M7122 Synovial cyst of popliteal space [Baker], left knee: Secondary | ICD-10-CM

## 2021-10-24 DIAGNOSIS — M1712 Unilateral primary osteoarthritis, left knee: Secondary | ICD-10-CM

## 2021-10-24 NOTE — Patient Instructions (Signed)
Last injection and aspiration was March 16th.  ?

## 2021-10-24 NOTE — Progress Notes (Signed)
? ? ?Careteam: ?Patient Care Team: ?Traci Dad, MD as PCP - General (Internal Medicine) ?Druscilla Brownie, MD as Consulting Physician (Dermatology) ?Garlan Fair, MD as Consulting Physician (Gastroenterology) ?Mosetta Anis, MD as Referring Physician (Allergy) ?Sueanne Margarita, MD as Consulting Physician (Cardiology) ? ?PLACE OF SERVICE:  ?[facility]  ?Advanced Directive information ?  ? ?Allergies  ?Allergen Reactions  ? Dust Mite Extract   ? Pollen Extract   ? ? ?Chief Complaint  ?Patient presents with  ? Acute Visit  ?  Patient has cyst behind left knee. Patient has had cyst for about a month. Patient states she has pain,but not all the time. Patient recently had baker's cyst aspirated.  ? ? ? ?HPI: Patient is a 81 y.o. female to follow up after orthopedic appt.  ?Reports she went to the orthopedic who then told her to come back to PCP for referral to IR for aspiration.  ?She is under the impression that she will continue to have to go back to [facility] to have the cyst aspirated.  ?She has had 5 cyst so far and 4 aspirated.   ?Reports cyst are always on the left.  ?Reports she can not even feel the cyst at this time.  ?Reports she got injection with cortisone with her last aspiration and so pain is not nearly as bad ?Last injection was March 16th.  ?She feels like she can do physical activity ? ? ?Review of Systems:  ?Review of Systems  ?Constitutional:  Negative for chills and fever.  ?Cardiovascular:  Positive for leg swelling. Negative for chest pain and palpitations.  ?Musculoskeletal:  Positive for joint pain. Negative for back pain and myalgias.  ?Neurological:  Negative for dizziness and headaches.  ?Psychiatric/Behavioral:  Negative for depression.   ? ?Past Medical History:  ?Diagnosis Date  ? Allergy   ? Ankle fracture, left   ? Baker cyst 04/19/2020  ? Bigeminy   ? Cataract   ? Colon polyps   ? Diverticulosis   ? FH: colon cancer   ? Menopausal and perimenopausal disorder   ?  Multiple allergies   ? Osteoporosis 2008  ? PER DEXA SCAN  ? PVC (premature ventricular contraction)   ? ?Past Surgical History:  ?Procedure Laterality Date  ? APPENDECTOMY    ? CATARACT EXTRACTION W/ INTRAOCULAR LENS  IMPLANT, BILATERAL    ? IR US GUIDE BX ASP/DRAIN  05/12/2020  ? PERFORATED APPENDIX    ? SMALL BOWEL OBSTRUCTION    ? ?Social History: ?  reports that she has never smoked. She has never used smokeless tobacco. She reports current alcohol use. She reports that she does not use drugs. ? ?Family History  ?Problem Relation Age of Onset  ? Colon cancer Mother   ? CAD Father   ? Dementia Father   ? Heart disease Father   ? Colon cancer Brother   ? Breast cancer Paternal Aunt   ? ? ?Medications: ?Patient's Medications  ?New Prescriptions  ? No medications on file  ?Previous Medications  ? CALCIUM CARB-ERGOCALCIFEROL 500-200 MG-UNIT TABS    Take 2 tablets by mouth daily.   ? CHOLECALCIFEROL (VITAMIN D3) 2000 UNITS CAPSULE    Take 2,000 Units by mouth daily.  ? DENOSUMAB (PROLIA) 60 MG/ML SOLN INJECTION    Inject 60 mg into the skin every 6 (six) months. Administer in upper arm, thigh, or abdomen  ? LOSARTAN (COZAAR) 50 MG TABLET    Take 1 tablet (50 mg total) by  mouth daily.  ? MELOXICAM (MOBIC) 15 MG TABLET    Take 15 mg by mouth daily.  ? NON FORMULARY    Antronex dietary supplement 1 per day  ? NON FORMULARY    Seneplex take 1 tablet once daily  ? NON FORMULARY    Bio-C-Plus, take tablet by mouth once daily  ? VITAMIN B-12 (CYANOCOBALAMIN) 1000 MCG TABLET    Take 1,000 mcg by mouth daily.  ?Modified Medications  ? No medications on file  ?Discontinued Medications  ? No medications on file  ? ? ?Physical Exam: ? ?Vitals:  ? 10/24/21 1505  ?BP: 120/78  ?Pulse: 62  ?Temp: 97.7 ?F (36.5 ?C)  ?SpO2: 97%  ?Height: '5\' 1"'$  (1.549 m)  ? ?Body mass index is 26.07 kg/m?. ?Wt Readings from Last 3 Encounters:  ?10/10/21 138 lb (62.6 kg)  ?09/28/21 136 lb (61.7 kg)  ?08/31/21 136 lb (61.7 kg)  ? ? ?Physical  Exam ?Constitutional:   ?   Appearance: Normal appearance.  ?Musculoskeletal:  ?   Right lower leg: No edema.  ?   Left lower leg: Edema present.  ?Skin: ?   General: Skin is warm and dry.  ?Neurological:  ?   Mental Status: She is alert and oriented to person, place, and time.  ?   Gait: Gait normal.  ?Psychiatric:     ?   Mood and Affect: Mood normal.     ?   Behavior: Behavior normal.  ? ? ?Labs reviewed: ?Basic Metabolic Panel: ?Recent Labs  ?  02/01/21 ?0000 06/21/21 ?0000  ?NA 141 145  ?K 3.9 3.9  ?CL 104 104  ?CO2 27* 24*  ?BUN 13 16  ?CREATININE 0.8 0.8  ?CALCIUM 9.5 9.1  ?TSH  --  2.63  ? ?Liver Function Tests: ?Recent Labs  ?  06/21/21 ?0000  ?AST 26  ?ALT 25  ?ALKPHOS 54  ?ALBUMIN 4.0  ? ?No results for input(s): LIPASE, AMYLASE in the last 8760 hours. ?No results for input(s): AMMONIA in the last 8760 hours. ?CBC: ?Recent Labs  ?  12/14/20 ?0000 06/21/21 ?0000  ?WBC 3.4 3.3  ?HGB 12.8 12.3  ?HCT 38 37  ?PLT 178 176  ? ?Lipid Panel: ?Recent Labs  ?  12/14/20 ?0000 06/21/21 ?0000  ?CHOL 239* 231*  ?HDL 78* 74*  ?Snyder 149 141  ?TRIG 59 80  ? ?TSH: ?Recent Labs  ?  06/21/21 ?0000  ?TSH 2.63  ? ?A1C: ?Lab Results  ?Component Value Date  ? HGBA1C 5.4 10/29/2014  ? ? ? ?Assessment/Plan ?.1. Arthritis of left knee ?Stable, pain has improved after last aspiration and injection  ? ?2. Baker's cyst of knee, left ?Recurrent, orthopedics recommended she follow up with PCP with referral to IR to aspirate at this time. She is not having a lot of pain so may wait to schedule appt.  ?- US Guided Needle Placement; Future ? ? ? ?[name]. Dewaine Oats, AGNP ? ?[facility] ?[phone]  ?

## 2021-11-23 ENCOUNTER — Ambulatory Visit: Payer: Medicare (Managed Care) | Admitting: Internal Medicine

## 2021-11-23 ENCOUNTER — Encounter: Payer: Self-pay | Admitting: Internal Medicine

## 2021-11-23 VITALS — BP 134/86 | HR 76 | Temp 96.6°F | Ht 61.0 in | Wt 135.4 lb

## 2021-11-23 DIAGNOSIS — E7800 Pure hypercholesterolemia, unspecified: Secondary | ICD-10-CM | POA: Diagnosis not present

## 2021-11-23 DIAGNOSIS — T50B95A Adverse effect of other viral vaccines, initial encounter: Secondary | ICD-10-CM | POA: Diagnosis not present

## 2021-11-23 DIAGNOSIS — Z636 Dependent relative needing care at home: Secondary | ICD-10-CM | POA: Diagnosis not present

## 2021-11-23 DIAGNOSIS — I10 Essential (primary) hypertension: Secondary | ICD-10-CM | POA: Diagnosis not present

## 2021-11-23 NOTE — Progress Notes (Signed)
Location:  [facility]   Place of Service:   [facility]  Provider:   Code Status:  Goals of Care:     11/23/2021   11:06 AM  Advanced Directives  Does Patient Have a Medical Advance Directive? Yes  Type of Paramedic of [place];Living will  Does patient want to make changes to medical advance directive? No - Patient declined  Copy of [place] in Chart? Yes - validated most recent copy scanned in chart (See row information)     Chief Complaint  Patient presents with   Acute Visit    Episode after booster vaccine.  Patient "couldn't get the words out" and trouble communicating. Patient had vaccine on Monday.     HPI: Patient is a 81 y.o. female seen today for an acute visit for Possible reaction to Covid vaccine  Patient lives with her husband in [place] apartment in [place]  Patient has h/o Osteoporosis ON Prolia Left Lower leg Swelling since 05/21. [place] was negative Does have h/o Left Ankle Fracture in 2009 H/o Left knee Bakers Cyst TMJ arthritis  She had Covid Vaccine on Mon and Tues Late morning she noticed she was having difficult time with getting her words out Episode Lasted for total of 10 min She called the facility nurse who examined her and her symptoms were resolved by then No More symptoms since then   Past Medical History:  Diagnosis Date   Allergy    Ankle fracture, left    Baker cyst 04/19/2020   Bigeminy    Cataract    Colon polyps    Diverticulosis    FH: colon cancer    Menopausal and perimenopausal disorder    Multiple allergies    Osteoporosis 2008   PER DEXA SCAN   PVC (premature ventricular contraction)     Past Surgical History:  Procedure Laterality Date   APPENDECTOMY     CATARACT EXTRACTION W/ INTRAOCULAR LENS  IMPLANT, BILATERAL     IR US GUIDE BX ASP/DRAIN  05/12/2020   PERFORATED APPENDIX     SMALL BOWEL OBSTRUCTION      Allergies  Allergen Reactions   Dust Mite Extract     Pollen Extract     Outpatient Encounter Medications as of 11/23/2021  Medication Sig   Calcium Carb-Ergocalciferol 500-200 MG-UNIT TABS Take 2 tablets by mouth daily.    Cholecalciferol (VITAMIN D3) 2000 units capsule Take 2,000 Units by mouth daily.   denosumab (PROLIA) 60 MG/ML SOLN injection Inject 60 mg into the skin every 6 (six) months. Administer in upper arm, thigh, or abdomen   losartan (COZAAR) 50 MG tablet Take 1 tablet (50 mg total) by mouth daily.   meloxicam (MOBIC) 15 MG tablet Take 15 mg by mouth daily.   NON FORMULARY Antronex dietary supplement 1 per day   NON FORMULARY Seneplex take 1 tablet once daily   NON FORMULARY Bio-C-Plus, take tablet by mouth once daily   vitamin B-12 (CYANOCOBALAMIN) 1000 MCG tablet Take 1,000 mcg by mouth daily.   No facility-administered encounter medications on file as of 11/23/2021.    Review of Systems:  Review of Systems  Constitutional:  Negative for activity change and appetite change.  HENT: Negative.    Respiratory:  Negative for cough and shortness of breath.   Cardiovascular:  Positive for leg swelling.  Gastrointestinal:  Negative for constipation.  Genitourinary: Negative.   Musculoskeletal:  Negative for arthralgias, gait problem and myalgias.  Skin: Negative.  Neurological:  Negative for dizziness and weakness.  Psychiatric/Behavioral:  Negative for confusion, dysphoric mood and sleep disturbance.    Health Maintenance  Topic Date Due   COVID-19 Vaccine (4 - Booster for Moderna series) 05/10/2022 (Originally 05/27/2021)   MAMMOGRAM  12/21/2021   INFLUENZA VACCINE  01/17/2022   TETANUS/TDAP  10/11/2031   Pneumonia Vaccine 75+ Years old  Completed   DEXA SCAN  Completed   Zoster Vaccines- Shingrix  Completed   HPV VACCINES  Aged Out   COLONOSCOPY (Pts 45-67yr Insurance coverage will need to be confirmed)  Discontinued    Physical Exam: Vitals:   11/23/21 1103  BP: 134/86  Pulse: 76  Temp: (!) 96.6 F (35.9 C)   TempSrc: Temporal  SpO2: 98%  Weight: 135 lb 6.4 oz (61.4 kg)  Height: '5\' 1"'$  (1.549 m)   Body mass index is 25.58 kg/m. Physical Exam Vitals reviewed.  Constitutional:      Appearance: Normal appearance.  HENT:     Head: Normocephalic.     Nose: Nose normal.     Mouth/Throat:     Mouth: Mucous membranes are moist.     Pharynx: Oropharynx is clear.  Eyes:     Pupils: Pupils are equal, round, and reactive to light.  Cardiovascular:     Rate and Rhythm: Normal rate and regular rhythm.     Pulses: Normal pulses.     Heart sounds: Normal heart sounds. No murmur heard. Pulmonary:     Effort: Pulmonary effort is normal.     Breath sounds: Normal breath sounds.  Abdominal:     General: Abdomen is flat. Bowel sounds are normal.     Palpations: Abdomen is soft.  Musculoskeletal:        General: Swelling present.     Cervical back: Neck supple.  Skin:    General: Skin is warm.  Neurological:     General: No focal deficit present.     Mental Status: She is alert and oriented to person, place, and time.     Comments: Speech was normal No Signs of Aphasia  Psychiatric:        Mood and Affect: Mood normal.        Thought Content: Thought content normal.    Labs reviewed: Basic Metabolic Panel: Recent Labs    02/01/21 0000 06/21/21 0000  NA 141 145  K 3.9 3.9  CL 104 104  CO2 27* 24*  BUN 13 16  CREATININE 0.8 0.8  CALCIUM 9.5 9.1  TSH  --  2.63   Liver Function Tests: Recent Labs    06/21/21 0000  AST 26  ALT 25  ALKPHOS 54  ALBUMIN 4.0   No results for input(s): LIPASE, AMYLASE in the last 8760 hours. No results for input(s): AMMONIA in the last 8760 hours. CBC: Recent Labs    12/14/20 0000 06/21/21 0000  WBC 3.4 3.3  HGB 12.8 12.3  HCT 38 37  PLT 178 176   Lipid Panel: Recent Labs    12/14/20 0000 06/21/21 0000  CHOL 239* 231*  HDL 78* 74*  LDLCALC 149 141  TRIG 59 80   Lab Results  Component Value Date   HGBA1C 5.4 10/29/2014     Procedures since last visit: No results found.  Assessment/Plan 1. Adverse reaction to COVID-19 vaccine Possible Also Can be TIA with her history of Hypertension and HLD Told patient to call 911 and go to ED if this happen again   2. Caregiver stress  Takes care of her husband who has dementia  3. Essential hypertension, benign Stable on Cozaar  4. Pure hypercholesterolemia Diet and Exercise    Labs/tests ordered:  * No order type specified * Next appt:  12/16/2021

## 2021-12-06 ENCOUNTER — Ambulatory Visit
Admission: RE | Admit: 2021-12-06 | Discharge: 2021-12-06 | Disposition: A | Discharge status: Home / Self Care | Payer: Medicare (Managed Care) | Source: Ambulatory Visit | Attending: Nurse Practitioner | Admitting: Nurse Practitioner

## 2021-12-06 DIAGNOSIS — M7122 Synovial cyst of popliteal space [Baker], left knee: Secondary | ICD-10-CM | POA: Diagnosis not present

## 2021-12-15 ENCOUNTER — Telehealth: Payer: Self-pay | Admitting: *Deleted

## 2021-12-15 NOTE — Telephone Encounter (Signed)
Called Traci Woodard [phone] (Part B) to obtain Prolia Prior Authorization.  Spoke with Hope and Authorization was submitted and went into determination. Medical Records requested to be faxed to Fax: [phone]. Faxed.   Pending Authorization Reference #: [id number]  Member ID: [id number] [employer] [phone])   Patient's appointment rescheduled from 6/30 to 12/29/2021 due to Pending Authorization.

## 2021-12-16 ENCOUNTER — Ambulatory Visit: Payer: Medicare PPO

## 2021-12-16 NOTE — Telephone Encounter (Signed)
Received fax from [place] was APPROVED 12/16/21-12/17/22.   Authorization #: [id number]  Patient has an upcoming appointment.

## 2021-12-22 LAB — BASIC METABOLIC PANEL
BUN: 17 (ref 4–21)
CO2: 26 — AB (ref 13–22)
Chloride: 106 (ref 99–108)
Creatinine: 0.8 (ref 0.5–1.1)
Glucose: 94
Potassium: 3.8 mEq/L (ref 3.5–5.1)
Sodium: 140 (ref 137–147)

## 2021-12-22 LAB — LIPID PANEL
Cholesterol: 238 — AB (ref 0–200)
HDL: 79 — AB (ref 35–70)
LDL Cholesterol: 145
Triglycerides: 71 (ref 40–160)

## 2021-12-22 LAB — CBC AND DIFFERENTIAL
HCT: 38 (ref 36–46)
Hemoglobin: 12.6 (ref 12.0–16.0)
Platelets: 151 10*3/uL (ref 150–400)
WBC: 3.5

## 2021-12-22 LAB — COMPREHENSIVE METABOLIC PANEL: Calcium: 9.4 (ref 8.7–10.7)

## 2021-12-22 LAB — CBC: RBC: 4.18 (ref 3.87–5.11)

## 2021-12-28 ENCOUNTER — Non-Acute Institutional Stay: Payer: Medicare (Managed Care) | Admitting: Internal Medicine

## 2021-12-28 ENCOUNTER — Encounter: Payer: Self-pay | Admitting: Internal Medicine

## 2021-12-28 VITALS — BP 122/82 | HR 69 | Temp 98.0°F | Ht 61.0 in | Wt 137.2 lb

## 2021-12-28 DIAGNOSIS — M1712 Unilateral primary osteoarthritis, left knee: Secondary | ICD-10-CM

## 2021-12-28 DIAGNOSIS — I10 Essential (primary) hypertension: Secondary | ICD-10-CM | POA: Diagnosis not present

## 2021-12-28 DIAGNOSIS — M810 Age-related osteoporosis without current pathological fracture: Secondary | ICD-10-CM

## 2021-12-28 DIAGNOSIS — Z636 Dependent relative needing care at home: Secondary | ICD-10-CM

## 2021-12-28 DIAGNOSIS — M7122 Synovial cyst of popliteal space [Baker], left knee: Secondary | ICD-10-CM

## 2021-12-28 DIAGNOSIS — I890 Lymphedema, not elsewhere classified: Secondary | ICD-10-CM

## 2021-12-28 DIAGNOSIS — E7800 Pure hypercholesterolemia, unspecified: Secondary | ICD-10-CM | POA: Diagnosis not present

## 2021-12-28 NOTE — Progress Notes (Addendum)
Location:  [place] of Service:  Clinic (12)  Provider:   Code Status:  Goals of Care:     12/28/2021    8:05 AM  Advanced Directives  Does Patient Have a Medical Advance Directive? Yes  Type of Paramedic of [place];Living will;Out of facility DNR (pink MOST or yellow form)  Does patient want to make changes to medical advance directive? No - Patient declined  Copy of [place] in Chart? Yes - validated most recent copy scanned in chart (See row information)  Pre-existing out of facility DNR order (yellow form or pink MOST form) Yellow form placed in chart (order not valid for inpatient use)     Chief Complaint  Patient presents with   Medical Management of Chronic Issues    Patient presents today for a 6 month follow up.    Quality Metric Gaps    Discuss the need for a mammogram, or post pone if patient refuses.     HPI: Patient is a 81 y.o. female seen today for medical management of chronic diseases.     Patient has h/o Osteoporosis ON Prolia Left Lower leg Swelling since 05/21. [place] was negative Does have h/o Left Ankle Fracture in 2009 H/o Left knee Bakers Cyst and Bursitis per Ortho TMJ arthritis Caregiver stress takes care of her husband with dementia  Recently having some issues with Word findings when stressed  No other issues No falls Walking well. Knee pain better on Mobic per ortho  Past Medical History:  Diagnosis Date   Allergy    Ankle fracture, left    Baker cyst 04/19/2020   Bigeminy    Cataract    Colon polyps    Diverticulosis    FH: colon cancer    Menopausal and perimenopausal disorder    Multiple allergies    Osteoporosis 2008   PER DEXA SCAN   PVC (premature ventricular contraction)     Past Surgical History:  Procedure Laterality Date   APPENDECTOMY     CATARACT EXTRACTION W/ INTRAOCULAR LENS  IMPLANT, BILATERAL     IR US GUIDE BX ASP/DRAIN  05/12/2020    PERFORATED APPENDIX     SMALL BOWEL OBSTRUCTION      Allergies  Allergen Reactions   Dust Mite Extract    Pollen Extract     Outpatient Encounter Medications as of 12/28/2021  Medication Sig   Calcium Carb-Ergocalciferol 500-200 MG-UNIT TABS Take 2 tablets by mouth daily.    Cholecalciferol (VITAMIN D3) 2000 units capsule Take 2,000 Units by mouth daily.   denosumab (PROLIA) 60 MG/ML SOLN injection Inject 60 mg into the skin every 6 (six) months. Administer in upper arm, thigh, or abdomen   losartan (COZAAR) 50 MG tablet Take 1 tablet (50 mg total) by mouth daily.   meloxicam (MOBIC) 15 MG tablet Take 15 mg by mouth daily.   NON FORMULARY Antronex dietary supplement 1 per day   NON FORMULARY Seneplex take 1 tablet once daily   NON FORMULARY Bio-C-Plus, take tablet by mouth once daily   vitamin B-12 (CYANOCOBALAMIN) 1000 MCG tablet Take 1,000 mcg by mouth daily.   No facility-administered encounter medications on file as of 12/28/2021.    Review of Systems:  Review of Systems  Constitutional:  Negative for activity change and appetite change.  HENT: Negative.    Respiratory:  Negative for cough and shortness of breath.   Cardiovascular:  Negative for leg swelling.  Gastrointestinal:  Negative for constipation.  Genitourinary: Negative.   Musculoskeletal:  Negative for arthralgias, gait problem and myalgias.  Skin: Negative.   Neurological:  Negative for dizziness and weakness.  Psychiatric/Behavioral:  Negative for confusion, dysphoric mood and sleep disturbance.     Health Maintenance  Topic Date Due   MAMMOGRAM  12/21/2021   COVID-19 Vaccine (4 - Moderna series) 05/10/2022 (Originally 05/27/2021)   INFLUENZA VACCINE  01/17/2022   TETANUS/TDAP  10/11/2031   Pneumonia Vaccine 28+ Years old  Completed   DEXA SCAN  Completed   Zoster Vaccines- Shingrix  Completed   HPV VACCINES  Aged Out   COLONOSCOPY (Pts 45-61yr Insurance coverage will need to be confirmed)   Discontinued    Physical Exam: Vitals:   12/28/21 0805  BP: 122/82  Pulse: 69  Temp: 98 F (36.7 C)  TempSrc: Temporal  SpO2: 98%  Weight: 137 lb 3.2 oz (62.2 kg)  Height: '5\' 1"'$  (1.549 m)   Body mass index is 25.92 kg/m. Physical Exam Vitals reviewed.  Constitutional:      Appearance: Normal appearance.  HENT:     Head: Normocephalic.     Nose: Nose normal.     Mouth/Throat:     Mouth: Mucous membranes are moist.     Pharynx: Oropharynx is clear.  Eyes:     Pupils: Pupils are equal, round, and reactive to light.  Cardiovascular:     Rate and Rhythm: Normal rate and regular rhythm.     Pulses: Normal pulses.     Heart sounds: Normal heart sounds. No murmur heard. Pulmonary:     Effort: Pulmonary effort is normal.     Breath sounds: Normal breath sounds.  Abdominal:     General: Abdomen is flat. Bowel sounds are normal.     Palpations: Abdomen is soft.  Musculoskeletal:        General: No swelling.     Cervical back: Neck supple.  Skin:    General: Skin is warm.  Neurological:     General: No focal deficit present.     Mental Status: She is alert and oriented to person, place, and time.  Psychiatric:        Mood and Affect: Mood normal.        Thought Content: Thought content normal.     Labs reviewed: Basic Metabolic Panel: Recent Labs    02/01/21 0000 06/21/21 0000 12/22/21 0000  NA 141 145 140  K 3.9 3.9 3.8  CL 104 104 106  CO2 27* 24* 26*  BUN '13 16 17  '$ CREATININE 0.8 0.8 0.8  CALCIUM 9.5 9.1 9.4  TSH  --  2.63  --    Liver Function Tests: Recent Labs    06/21/21 0000  AST 26  ALT 25  ALKPHOS 54  ALBUMIN 4.0   No results for input(s): "LIPASE", "AMYLASE" in the last 8760 hours. No results for input(s): "AMMONIA" in the last 8760 hours. CBC: Recent Labs    06/21/21 0000 12/22/21 0000  WBC 3.3 3.5  HGB 12.3 12.6  HCT 37 38  PLT 176 151   Lipid Panel: Recent Labs    06/21/21 0000 12/22/21 0000  CHOL 231* 238*  HDL 74* 79*   LDLCALC 141 145  TRIG 80 71   Lab Results  Component Value Date   HGBA1C 5.4 10/29/2014    Procedures since last visit: [place] Needle Placement  Result Date: 12/06/2021 INDICATION: Recurrent symptomatic left popliteal fossa Baker's cyst requiring periodic  aspiration and therapeutic injection. EXAM: ASPIRATION AND INJECTION OF LEFT POPLITEAL FOSSA BAKER'S CYST MEDICATIONS: None ANESTHESIA/SEDATION: None COMPLICATIONS: None immediate. PROCEDURE: Informed written consent was obtained from the patient after a thorough discussion of the procedural risks, benefits and alternatives. All questions were addressed. Maximal Sterile Barrier Technique was utilized including caps, mask, sterile gowns, sterile gloves, sterile drape, hand hygiene and skin antiseptic. A timeout was performed prior to the initiation of the procedure. Ultrasound was performed at the level of the left popliteal fossa. The skin was marked and prepped. 1% lidocaine was infiltrated at the level of the skin and subcutaneous tissues. An 18 gauge spinal needle was advanced to the level of a popliteal fossa cyst. Aspiration of fluid was performed. A mixture of 80 mg Depo-Medrol with 4 mL of 0.5% Sensorcaine was made. Of this mixture, a total of 2 mL of this solution was injected into the cyst cavity. Additional ultrasound was performed. FINDINGS: Elongated Baker's cyst of the left popliteal fossa measures approximately 3.8 cm in greatest diameter by today's study. Aspiration yielded approximately 2 mL of clear, viscous fluid and decompression of the Baker's cyst by ultrasound. Aspirated volume was replaced by identical volume of the steroid/anesthetic solution. IMPRESSION: Ultrasound-guided aspiration and therapeutic injection recurrent symptomatic left popliteal fossa Baker's cyst. Electronically Signed   By: Aletta Edouard M.D.   On: 12/06/2021 14:15    Assessment/Plan 1. Essential hypertension, benign BP much better on Cozaar  2.  Pure hypercholesterolemia Good Hdl  Continue diet and exercise  3. Arthritis of left knee Saw Ortho Most likely Bursitis Recommended Therapy and Mobic  4. Baker's cyst of knee, left Aspiration PRN  5. Caregiver stress She says she sometimes   6. Senile osteoporosis On Prolia DEXA done in 2021 Repeat Pending   7. Lymphedema of left leg Has h/o Ankle surgery  Dopplers have been negative Wears Ted hose which helps  Also will get her Mammogram  Labs/tests ordered:  Labs ordered before Next Appointment Next appt:  12/29/2021

## 2021-12-29 ENCOUNTER — Ambulatory Visit (INDEPENDENT_AMBULATORY_CARE_PROVIDER_SITE_OTHER): Payer: Medicare (Managed Care) | Admitting: *Deleted

## 2021-12-29 DIAGNOSIS — M810 Age-related osteoporosis without current pathological fracture: Secondary | ICD-10-CM | POA: Diagnosis not present

## 2021-12-29 MED ORDER — DENOSUMAB 60 MG/ML ~~LOC~~ SOSY
60.0000 mg | PREFILLED_SYRINGE | Freq: Once | SUBCUTANEOUS | Status: AC
  Administered 2021-12-29: 60 mg via SUBCUTANEOUS

## 2022-02-13 ENCOUNTER — Other Ambulatory Visit: Payer: Self-pay | Admitting: Internal Medicine

## 2022-02-13 DIAGNOSIS — Z1231 Encounter for screening mammogram for malignant neoplasm of breast: Secondary | ICD-10-CM

## 2022-02-28 ENCOUNTER — Ambulatory Visit
Admission: RE | Admit: 2022-02-28 | Discharge: 2022-02-28 | Disposition: A | Discharge status: Home / Self Care | Payer: Medicare (Managed Care) | Source: Ambulatory Visit | Attending: Internal Medicine | Admitting: Internal Medicine

## 2022-02-28 DIAGNOSIS — M810 Age-related osteoporosis without current pathological fracture: Secondary | ICD-10-CM

## 2022-02-28 DIAGNOSIS — Z1231 Encounter for screening mammogram for malignant neoplasm of breast: Secondary | ICD-10-CM

## 2022-03-20 ENCOUNTER — Other Ambulatory Visit (HOSPITAL_BASED_OUTPATIENT_CLINIC_OR_DEPARTMENT_OTHER): Payer: Self-pay

## 2022-03-20 MED ORDER — FLUAD QUADRIVALENT 0.5 ML IM PRSY
PREFILLED_SYRINGE | INTRAMUSCULAR | 0 refills | Status: DC
  Filled 2022-03-20: qty 0.5, 1d supply, fill #0

## 2022-05-09 ENCOUNTER — Non-Acute Institutional Stay: Payer: Medicare (Managed Care) | Admitting: Internal Medicine

## 2022-05-09 ENCOUNTER — Encounter: Payer: Self-pay | Admitting: Internal Medicine

## 2022-05-09 VITALS — BP 148/92 | HR 67 | Temp 97.8°F | Resp 17 | Ht 61.0 in | Wt 136.0 lb

## 2022-05-09 DIAGNOSIS — E7800 Pure hypercholesterolemia, unspecified: Secondary | ICD-10-CM

## 2022-05-09 DIAGNOSIS — I10 Essential (primary) hypertension: Secondary | ICD-10-CM

## 2022-05-09 DIAGNOSIS — M7989 Other specified soft tissue disorders: Secondary | ICD-10-CM

## 2022-05-09 DIAGNOSIS — M810 Age-related osteoporosis without current pathological fracture: Secondary | ICD-10-CM

## 2022-05-09 NOTE — Progress Notes (Signed)
Location: [place] of Service:  Clinic (12)  Provider:   Code Status:  Goals of Care:     05/09/2022    3:21 PM  Advanced Directives  Does Patient Have a Medical Advance Directive? Yes  Type of Advance Directive Living will;Out of facility DNR (pink MOST or yellow form);[place] in Chart? Yes - validated most recent copy scanned in chart (See row information)     Chief Complaint  Patient presents with   Acute Visit    Patient states she has been experiencing some left leg swelling over the last 2 weeks. Patient states she has a history of cyst    HPI: Patient is a 81 y.o. female seen today for an acute visit for Left leg swelling worse in past 2 weeks  Patient has a history of left knee Baker's cyst and bursitis per Ortho.  Also history of left lower leg swelling since 05/21 ultrasound done in 2021 was negative for any DVT Patient came with complaint of worsening left leg swelling and mild pain.  She has TED hoses on but her left leg is swollen. No Baker's cyst felt. Past Medical History:  Diagnosis Date   Allergy    Ankle fracture, left    Baker cyst 04/19/2020   Bigeminy    Cataract    Colon polyps    Diverticulosis    FH: colon cancer    Menopausal and perimenopausal disorder    Multiple allergies    Osteoporosis 2008   PER DEXA SCAN   PVC (premature ventricular contraction)     Past Surgical History:  Procedure Laterality Date   APPENDECTOMY     CATARACT EXTRACTION W/ INTRAOCULAR LENS  IMPLANT, BILATERAL     IR US GUIDE BX ASP/DRAIN  05/12/2020   PERFORATED APPENDIX     SMALL BOWEL OBSTRUCTION      Allergies  Allergen Reactions   Dust Mite Extract    Pollen Extract     Outpatient Encounter Medications as of 05/09/2022  Medication Sig   Calcium Carb-Ergocalciferol 500-200 MG-UNIT TABS Take 2 tablets by mouth daily.    Cholecalciferol (VITAMIN D3) 2000 units  capsule Take 2,000 Units by mouth daily.   denosumab (PROLIA) 60 MG/ML SOLN injection Inject 60 mg into the skin every 6 (six) months. Administer in upper arm, thigh, or abdomen   influenza vaccine adjuvanted (FLUAD QUADRIVALENT) 0.5 ML injection Inject into the muscle.   losartan (COZAAR) 50 MG tablet Take 1 tablet (50 mg total) by mouth daily.   meloxicam (MOBIC) 15 MG tablet Take 15 mg by mouth daily.   NON FORMULARY Antronex dietary supplement 1 per day   NON FORMULARY Seneplex take 1 tablet once daily   NON FORMULARY Bio-C-Plus, take tablet by mouth once daily   vitamin B-12 (CYANOCOBALAMIN) 1000 MCG tablet Take 1,000 mcg by mouth daily.   No facility-administered encounter medications on file as of 05/09/2022.    Review of Systems:  Review of Systems  Constitutional:  Negative for activity change and appetite change.  HENT: Negative.    Respiratory:  Negative for cough and shortness of breath.   Cardiovascular:  Negative for leg swelling.  Gastrointestinal:  Negative for constipation.  Genitourinary: Negative.   Musculoskeletal:  Negative for arthralgias, gait problem and myalgias.  Skin: Negative.   Neurological:  Negative for dizziness and weakness.  Psychiatric/Behavioral:  Negative for confusion, dysphoric mood and sleep disturbance.  Health Maintenance  Topic Date Due   COVID-19 Vaccine (6 - 2023-24 season) 02/17/2022   Medicare Annual Wellness (AWV)  10/11/2022   MAMMOGRAM  03/01/2023   Pneumonia Vaccine 57+ Years old  Completed   INFLUENZA VACCINE  Completed   DEXA SCAN  Completed   Zoster Vaccines- Shingrix  Completed   HPV VACCINES  Aged Out   COLONOSCOPY (Pts 45-41yr Insurance coverage will need to be confirmed)  Discontinued    Physical Exam: Vitals:   05/09/22 1523  BP: (!) 148/92  Pulse: 67  Resp: 17  Temp: 97.8 F (36.6 C)  TempSrc: Temporal  SpO2: 96%  Weight: 136 lb (61.7 kg)  Height: '5\' 1"'$  (1.549 m)   Body mass index is 25.7  kg/m. Physical Exam Vitals reviewed.  Constitutional:      Appearance: Normal appearance.  HENT:     Head: Normocephalic.     Nose: Nose normal.     Mouth/Throat:     Mouth: Mucous membranes are moist.     Pharynx: Oropharynx is clear.  Eyes:     Pupils: Pupils are equal, round, and reactive to light.  Cardiovascular:     Rate and Rhythm: Normal rate and regular rhythm.     Pulses: Normal pulses.     Heart sounds: Normal heart sounds. No murmur heard. Pulmonary:     Effort: Pulmonary effort is normal.     Breath sounds: Normal breath sounds.  Abdominal:     General: Abdomen is flat. Bowel sounds are normal.     Palpations: Abdomen is soft.  Musculoskeletal:        General: Swelling present.     Cervical back: Neck supple.     Comments: Left Leg Moderate Amount of swelling with Some mild Venous changes  Skin:    General: Skin is warm.  Neurological:     General: No focal deficit present.     Mental Status: She is alert and oriented to person, place, and time.  Psychiatric:        Mood and Affect: Mood normal.        Thought Content: Thought content normal.     Labs reviewed: Basic Metabolic Panel: Recent Labs    06/21/21 0000 12/22/21 0000  NA 145 140  K 3.9 3.8  CL 104 106  CO2 24* 26*  BUN 16 17  CREATININE 0.8 0.8  CALCIUM 9.1 9.4  TSH 2.63  --    Liver Function Tests: Recent Labs    06/21/21 0000  AST 26  ALT 25  ALKPHOS 54  ALBUMIN 4.0   No results for input(s): "LIPASE", "AMYLASE" in the last 8760 hours. No results for input(s): "AMMONIA" in the last 8760 hours. CBC: Recent Labs    06/21/21 0000 12/22/21 0000  WBC 3.3 3.5  HGB 12.3 12.6  HCT 37 38  PLT 176 151   Lipid Panel: Recent Labs    06/21/21 0000 12/22/21 0000  CHOL 231* 238*  HDL 74* 79*  LDLCALC 141 145  TRIG 80 71   Lab Results  Component Value Date   HGBA1C 5.4 10/29/2014    Procedures since last visit: No results found.  Assessment/Plan 1. Left leg swelling  Has h/o Lymphedema in that leg reorder - VAS [place]OWER EXTREMITY VENOUS (DVT)   Other issues  Essential hypertension, benign BP much better on Cozaar    Pure hypercholesterolemia Good Hdl  Continue diet and exercise    Arthritis of left knee Saw Ortho  Most likely Bursitis Recommended Therapy and Mobic    Baker's cyst of knee, left Aspiration PRN    Caregiver stress She says she sometimes     Senile osteoporosis On Prolia      Labs/tests ordered:  * No order type specified * Next appt:  07/03/2022

## 2022-05-16 ENCOUNTER — Ambulatory Visit (HOSPITAL_COMMUNITY)
Admission: RE | Admit: 2022-05-16 | Discharge: 2022-05-16 | Disposition: A | Discharge status: Home / Self Care | Payer: Medicare (Managed Care) | Source: Ambulatory Visit | Attending: Internal Medicine | Admitting: Internal Medicine

## 2022-05-16 DIAGNOSIS — M7989 Other specified soft tissue disorders: Secondary | ICD-10-CM | POA: Insufficient documentation

## 2022-06-26 ENCOUNTER — Encounter: Payer: Self-pay | Admitting: Adult Health

## 2022-06-26 ENCOUNTER — Non-Acute Institutional Stay: Payer: Medicare (Managed Care) | Admitting: Adult Health

## 2022-06-26 VITALS — BP 140/68 | HR 66 | Temp 97.8°F | Resp 18 | Ht 61.0 in | Wt 137.0 lb

## 2022-06-26 DIAGNOSIS — Z636 Dependent relative needing care at home: Secondary | ICD-10-CM | POA: Diagnosis not present

## 2022-06-26 DIAGNOSIS — M1712 Unilateral primary osteoarthritis, left knee: Secondary | ICD-10-CM

## 2022-06-26 DIAGNOSIS — M810 Age-related osteoporosis without current pathological fracture: Secondary | ICD-10-CM | POA: Diagnosis not present

## 2022-06-26 DIAGNOSIS — K432 Incisional hernia without obstruction or gangrene: Secondary | ICD-10-CM

## 2022-06-26 DIAGNOSIS — I10 Essential (primary) hypertension: Secondary | ICD-10-CM | POA: Diagnosis not present

## 2022-06-26 DIAGNOSIS — E7800 Pure hypercholesterolemia, unspecified: Secondary | ICD-10-CM

## 2022-06-26 NOTE — Patient Instructions (Addendum)
Reduce mobic to Monday Wednesday Friday, if having no pain after two weeks may reduce to as needed for pain

## 2022-06-26 NOTE — Progress Notes (Signed)
Location:  [facility]  POS: Clinic  Provider: Royal Hawthorn, ANP  Code Status: *** Goals of Care:     06/26/2022    3:27 PM  Advanced Directives  Does Patient Have a Medical Advance Directive? Yes  Type of Advance Directive Living will;Out of facility DNR (pink MOST or yellow form);[place] in Chart? Yes - validated most recent copy scanned in chart (See row information)     Chief Complaint  Patient presents with  . Medical Management of Chronic Issues    6 month follow up  . Immunizations    Discussed the need for covid vaccine     HPI: Patient is a 82 y.o. female seen today for medical management of chronic diseases.    Had bakers cyst aspiration June 2023  Has incision hernia she wants me to evaluate it does not bother her  BMD 02/28/22 listed in media  Past Medical History:  Diagnosis Date  . Allergy   . Ankle fracture, left   . Baker cyst 04/19/2020  . Bigeminy   . Cataract   . Colon polyps   . Diverticulosis   . FH: colon cancer   . Menopausal and perimenopausal disorder   . Multiple allergies   . Osteoporosis 2008   PER DEXA SCAN  . PVC (premature ventricular contraction)     Past Surgical History:  Procedure Laterality Date  . APPENDECTOMY    . CATARACT EXTRACTION W/ INTRAOCULAR LENS  IMPLANT, BILATERAL    . IR US GUIDE BX ASP/DRAIN  05/12/2020  . PERFORATED APPENDIX    . SMALL BOWEL OBSTRUCTION      Allergies  Allergen Reactions  . Dust Mite Extract   . Pollen Extract     Outpatient Encounter Medications as of 06/26/2022  Medication Sig  . Calcium Carb-Ergocalciferol 500-200 MG-UNIT TABS Take 2 tablets by mouth daily.   . Cholecalciferol (VITAMIN D3) 2000 units capsule Take 2,000 Units by mouth daily.  Marland Kitchen denosumab (PROLIA) 60 MG/ML SOLN injection Inject 60 mg into the skin every 6 (six) months. Administer in upper arm, thigh, or abdomen  . influenza vaccine adjuvanted (FLUAD  QUADRIVALENT) 0.5 ML injection Inject into the muscle.  . losartan (COZAAR) 50 MG tablet Take 1 tablet (50 mg total) by mouth daily.  . meloxicam (MOBIC) 15 MG tablet Take 15 mg by mouth daily.  . NON FORMULARY Antronex dietary supplement 1 per day  . NON FORMULARY Seneplex take 1 tablet once daily  . NON FORMULARY Bio-C-Plus, take tablet by mouth once daily  . vitamin B-12 (CYANOCOBALAMIN) 1000 MCG tablet Take 1,000 mcg by mouth daily.   No facility-administered encounter medications on file as of 06/26/2022.    Review of Systems:  Review of Systems  Health Maintenance  Topic Date Due  . COVID-19 Vaccine (6 - 2023-24 season) 02/17/2022  . Medicare Annual Wellness (AWV)  10/11/2022  . MAMMOGRAM  03/01/2023  . DTaP/Tdap/Td (3 - Td or Tdap) 10/11/2031  . Pneumonia Vaccine 75+ Years old  Completed  . INFLUENZA VACCINE  Completed  . DEXA SCAN  Completed  . Zoster Vaccines- Shingrix  Completed  . HPV VACCINES  Aged Out  . COLONOSCOPY (Pts 45-73yr Insurance coverage will need to be confirmed)  Discontinued    Physical Exam: Vitals:   06/26/22 1527 06/26/22 1531  BP: (!) 144/68 (!) 140/68  Pulse: 66   Resp: 18   Temp: 97.8 F (36.6 C)  TempSrc: Temporal   SpO2: 98%   Weight: 137 lb (62.1 kg)   Height: '5\' 1"'$  (1.549 m)    Body mass index is 25.89 kg/m. Physical Exam  Labs reviewed: Basic Metabolic Panel: Recent Labs    12/22/21 0000  NA 140  K 3.8  CL 106  CO2 26*  BUN 17  CREATININE 0.8  CALCIUM 9.4   Liver Function Tests: No results for input(s): "AST", "ALT", "ALKPHOS", "BILITOT", "PROT", "ALBUMIN" in the last 8760 hours. No results for input(s): "LIPASE", "AMYLASE" in the last 8760 hours. No results for input(s): "AMMONIA" in the last 8760 hours. CBC: Recent Labs    12/22/21 0000  WBC 3.5  HGB 12.6  HCT 38  PLT 151   Lipid Panel: Recent Labs    12/22/21 0000  CHOL 238*  HDL 79*  LDLCALC 145  TRIG 71   Lab Results  Component Value Date    HGBA1C 5.4 10/29/2014    Procedures since last visit: No results found.  Assessment/Plan There are no diagnoses linked to this encounter.   Labs/tests ordered:  * No order type specified * CBC CMP Lipid Vit D Next appt:  6 months with Dr Lyndel Safe   Total time **:  time greater than 50% of total time spent doing pt counseling and coordination of care

## 2022-06-27 ENCOUNTER — Encounter: Payer: Self-pay | Admitting: Adult Health

## 2022-06-27 DIAGNOSIS — K432 Incisional hernia without obstruction or gangrene: Secondary | ICD-10-CM | POA: Insufficient documentation

## 2022-07-03 ENCOUNTER — Ambulatory Visit: Payer: Medicare (Managed Care)

## 2022-07-03 ENCOUNTER — Encounter: Payer: Medicare (Managed Care) | Admitting: Adult Health

## 2022-07-10 ENCOUNTER — Ambulatory Visit (INDEPENDENT_AMBULATORY_CARE_PROVIDER_SITE_OTHER): Payer: Medicare (Managed Care)

## 2022-07-10 DIAGNOSIS — M810 Age-related osteoporosis without current pathological fracture: Secondary | ICD-10-CM

## 2022-07-10 MED ORDER — DENOSUMAB 60 MG/ML ~~LOC~~ SOSY
60.0000 mg | PREFILLED_SYRINGE | Freq: Once | SUBCUTANEOUS | Status: AC
  Administered 2022-07-10: 60 mg via SUBCUTANEOUS

## 2022-07-11 LAB — CBC AND DIFFERENTIAL: HCT: 39 (ref 36–46)

## 2022-09-29 ENCOUNTER — Telehealth: Payer: Self-pay

## 2022-09-29 NOTE — Telephone Encounter (Signed)
Patient called for herself and her husband. She wants to know if they should sign up for the Spring COVID vaccine clinic there at [facility].  Message routed to Dr. Einar Crow

## 2022-10-02 NOTE — Telephone Encounter (Signed)
Let them know it is there choice. I would recommend definitely to take one in Fall but this one is more optional.

## 2022-10-03 NOTE — Telephone Encounter (Signed)
Spoke with patient and she says they will think about it and decide if they want to do it or not.

## 2022-10-16 ENCOUNTER — Other Ambulatory Visit: Payer: Self-pay | Admitting: Internal Medicine

## 2022-10-23 ENCOUNTER — Other Ambulatory Visit (HOSPITAL_COMMUNITY): Payer: Self-pay

## 2022-12-07 ENCOUNTER — Telehealth: Payer: Medicare (Managed Care) | Admitting: *Deleted

## 2022-12-07 NOTE — Telephone Encounter (Signed)
Initiated Prior Authorization through [employer] [phone] (Opt 5 Part B) for patient's Prolia Injection.   Spoke with Rutherford Nail B. And she stated that they need clinical notes faxed to Fax: [phone] for determination.   Notes Faxed, Awaiting Determination.  Ref#: [id number]

## 2022-12-12 NOTE — Telephone Encounter (Signed)
Received Fax from [employer] and Prolia was APPROVED 01/08/2023-01/08/2024  Customer ID: [id number] Authorization #: [id number]   Patient's Prolia Appointment is 01/12/2023 (falls within the APPROVED Dates)

## 2022-12-19 DIAGNOSIS — E785 Hyperlipidemia, unspecified: Secondary | ICD-10-CM | POA: Diagnosis not present

## 2022-12-19 DIAGNOSIS — M818 Other osteoporosis without current pathological fracture: Secondary | ICD-10-CM | POA: Diagnosis not present

## 2022-12-19 DIAGNOSIS — I10 Essential (primary) hypertension: Secondary | ICD-10-CM | POA: Diagnosis not present

## 2022-12-19 LAB — BASIC METABOLIC PANEL
BUN: 12 (ref 4–21)
CO2: 23 — AB (ref 13–22)
Chloride: 103 (ref 99–108)
Creatinine: 0.8 (ref 0.5–1.1)
Glucose: 79
Potassium: 4.3 mEq/L (ref 3.5–5.1)
Sodium: 139 (ref 137–147)

## 2022-12-19 LAB — HEPATIC FUNCTION PANEL
ALT: 23 U/L (ref 7–35)
AST: 27 (ref 13–35)
Alkaline Phosphatase: 62 (ref 25–125)
Bilirubin, Total: 0.5

## 2022-12-19 LAB — COMPREHENSIVE METABOLIC PANEL
Albumin: 3.8 (ref 3.5–5.0)
Calcium: 9.3 (ref 8.7–10.7)
Globulin: 1.9
eGFR: 74

## 2022-12-19 LAB — LIPID PANEL
Cholesterol: 244 — AB (ref 0–200)
HDL: 74 — AB (ref 35–70)
LDL Cholesterol: 154
Triglycerides: 80 (ref 40–160)

## 2022-12-19 LAB — VITAMIN D 25 HYDROXY (VIT D DEFICIENCY, FRACTURES): Vit D, 25-Hydroxy: 46.4

## 2022-12-26 ENCOUNTER — Encounter: Payer: Self-pay | Admitting: Internal Medicine

## 2022-12-26 ENCOUNTER — Non-Acute Institutional Stay: Payer: Medicare (Managed Care) | Admitting: Internal Medicine

## 2022-12-26 VITALS — BP 132/74 | HR 69 | Temp 98.0°F | Resp 16 | Ht 61.0 in | Wt 137.8 lb

## 2022-12-26 DIAGNOSIS — M810 Age-related osteoporosis without current pathological fracture: Secondary | ICD-10-CM

## 2022-12-26 DIAGNOSIS — M1712 Unilateral primary osteoarthritis, left knee: Secondary | ICD-10-CM | POA: Diagnosis not present

## 2022-12-26 DIAGNOSIS — E7800 Pure hypercholesterolemia, unspecified: Secondary | ICD-10-CM | POA: Diagnosis not present

## 2022-12-26 DIAGNOSIS — Z636 Dependent relative needing care at home: Secondary | ICD-10-CM

## 2022-12-26 DIAGNOSIS — R220 Localized swelling, mass and lump, head: Secondary | ICD-10-CM | POA: Diagnosis not present

## 2022-12-26 DIAGNOSIS — I890 Lymphedema, not elsewhere classified: Secondary | ICD-10-CM

## 2022-12-26 NOTE — Progress Notes (Signed)
Location:  [facility] features editor of Service:  Clinic (12)  Provider:   Code Status:  Goals of Care:     12/26/2022   10:04 AM  Advanced Directives  Does Patient Have a Medical Advance Directive? Yes  Type of Estate agent of [place];Living will;Out of facility DNR (pink MOST or yellow form)  Does patient want to make changes to medical advance directive? No - Patient declined     Chief Complaint  Patient presents with   Medical Management of Chronic Issues    6 month follow up     HPI: Patient is a 82 y.o. female seen today for medical management of chronic diseases.   Lives in [place] Husband  Patient has h/o Osteoporosis ON Prolia Left Lower leg Swelling since 05/21. [place] was negative Does have h/o Left Ankle Fracture in 2009 Dopplers Negative H/o Left knee Bakers Cyst and Bursitis per Ortho TMJ arthritis Caregiver stress takes care of her husband with dementia  Doing well No Issues Sometimes feels her balance is off but no falls Does not use any cane or walker Memory is doing well Some issues with Urinary Incontinence but manageable Gets Annual Eye exams  Past Medical History:  Diagnosis Date   Allergy    Ankle fracture, left    Baker cyst 04/19/2020   Bigeminy    Cataract    Colon polyps    Diverticulosis    FH: colon cancer    Menopausal and perimenopausal disorder    Multiple allergies    Osteoporosis 2008   PER DEXA SCAN   PVC (premature ventricular contraction)     Past Surgical History:  Procedure Laterality Date   APPENDECTOMY     CATARACT EXTRACTION W/ INTRAOCULAR LENS  IMPLANT, BILATERAL     IR US GUIDE BX ASP/DRAIN  05/12/2020   PERFORATED APPENDIX     SMALL BOWEL OBSTRUCTION      Allergies  Allergen Reactions   Dust Mite Extract    Pollen Extract     Outpatient Encounter Medications as of 12/26/2022  Medication Sig   Calcium Carb-Ergocalciferol 500-200 MG-UNIT TABS Take 2 tablets by mouth  daily.    Cholecalciferol (VITAMIN D3) 2000 units capsule Take 2,000 Units by mouth daily.   denosumab (PROLIA) 60 MG/ML SOLN injection Inject 60 mg into the skin every 6 (six) months. Administer in upper arm, thigh, or abdomen   influenza vaccine adjuvanted (FLUAD QUADRIVALENT) 0.5 ML injection Inject into the muscle.   losartan (COZAAR) 50 MG tablet Take 1 tablet (50 mg total) by mouth daily.   meloxicam (MOBIC) 15 MG tablet Take 15 mg by mouth 3 (three) times a week.   NON FORMULARY Antronex dietary supplement 1 per day   NON FORMULARY Seneplex take 1 tablet once daily   NON FORMULARY Bio-C-Plus, take tablet by mouth once daily   vitamin B-12 (CYANOCOBALAMIN) 1000 MCG tablet Take 1,000 mcg by mouth daily.   No facility-administered encounter medications on file as of 12/26/2022.    Review of Systems:  Review of Systems  Constitutional:  Negative for activity change and appetite change.  HENT: Negative.    Respiratory:  Negative for cough and shortness of breath.   Cardiovascular:  Negative for leg swelling.  Gastrointestinal:  Negative for constipation.  Genitourinary: Negative.   Musculoskeletal:  Negative for arthralgias, gait problem and myalgias.  Skin: Negative.   Neurological:  Negative for dizziness and weakness.  Psychiatric/Behavioral:  Negative for  confusion, dysphoric mood and sleep disturbance.     Health Maintenance  Topic Date Due   Medicare Annual Wellness (AWV)  10/11/2022   INFLUENZA VACCINE  01/18/2023   MAMMOGRAM  03/01/2023   DTaP/Tdap/Td (3 - Td or Tdap) 10/11/2031   Pneumonia Vaccine 26+ Years old  Completed   DEXA SCAN  Completed   COVID-19 Vaccine  Completed   Zoster Vaccines- Shingrix  Completed   HPV VACCINES  Aged Out   Colonoscopy  Discontinued    Physical Exam: Vitals:   12/26/22 1001  BP: 132/74  Pulse: 69  Resp: 16  Temp: 98 F (36.7 C)  TempSrc: Temporal  SpO2: 95%  Weight: 137 lb 12.8 oz (62.5 kg)  Height: 5\' 1"  (1.549 m)   Body  mass index is 26.04 kg/m. Physical Exam Vitals reviewed.  Constitutional:      Appearance: Normal appearance.  HENT:     Head: Normocephalic.     Right Ear: Tympanic membrane normal.     Left Ear: Tympanic membrane normal.     Nose: Nose normal.     Comments: Small Papular Growth noticed on her Palate No Ulcers Growth is symmetrical    Mouth/Throat:     Mouth: Mucous membranes are moist.     Pharynx: Oropharynx is clear.  Eyes:     Pupils: Pupils are equal, round, and reactive to light.  Cardiovascular:     Rate and Rhythm: Normal rate and regular rhythm.     Pulses: Normal pulses.     Heart sounds: Normal heart sounds. No murmur heard. Pulmonary:     Effort: Pulmonary effort is normal.     Breath sounds: Normal breath sounds.  Abdominal:     General: Abdomen is flat. Bowel sounds are normal.     Palpations: Abdomen is soft.  Musculoskeletal:        General: No swelling.     Cervical back: Neck supple.     Comments: Moderate swelling on her Left Leg  Skin:    General: Skin is warm.  Neurological:     General: No focal deficit present.     Mental Status: She is alert and oriented to person, place, and time.  Psychiatric:        Mood and Affect: Mood normal.        Thought Content: Thought content normal.     Labs reviewed: Basic Metabolic Panel: Recent Labs    12/19/22 0000  NA 139  K 4.3  CL 103  CO2 23*  BUN 12  CREATININE 0.8  CALCIUM 9.3   Liver Function Tests: Recent Labs    12/19/22 0000  AST 27  ALT 23  ALKPHOS 62  ALBUMIN 3.8   No results for input(s): "LIPASE", "AMYLASE" in the last 8760 hours. No results for input(s): "AMMONIA" in the last 8760 hours. CBC: No results for input(s): "WBC", "NEUTROABS", "HGB", "HCT", "MCV", "PLT" in the last 8760 hours. Lipid Panel: Recent Labs    12/19/22 0000  CHOL 244*  HDL 74*  LDLCALC 154  TRIG 80   Lab Results  Component Value Date   HGBA1C 5.4 10/29/2014    Procedures since last  visit: No results found.  Assessment/Plan 1. Senile osteoporosis On Prolia per [facility] offcie T score -2.6 in 2023 2. Pure hypercholesterolemia Ratio is good Continue Diet and Exercise  3. Palate mass Seems Benign Follow up in 3 months for check to make sure no change in Shape  4. Arthritis of left  knee Managing well Takes Meloxicam  5. Lymphedema of left leg Ted hoses  6. Caregiver stress Doing well 7 Hypertension On Cozaar  Doing well    Labs/tests ordered:  * No order type specified * Next appt:  01/12/2023

## 2022-12-29 DIAGNOSIS — Z961 Presence of intraocular lens: Secondary | ICD-10-CM | POA: Diagnosis not present

## 2022-12-29 DIAGNOSIS — H52203 Unspecified astigmatism, bilateral: Secondary | ICD-10-CM | POA: Diagnosis not present

## 2022-12-29 DIAGNOSIS — H353131 Nonexudative age-related macular degeneration, bilateral, early dry stage: Secondary | ICD-10-CM | POA: Diagnosis not present

## 2023-01-12 ENCOUNTER — Ambulatory Visit (INDEPENDENT_AMBULATORY_CARE_PROVIDER_SITE_OTHER): Payer: Medicare (Managed Care)

## 2023-01-12 DIAGNOSIS — M1712 Unilateral primary osteoarthritis, left knee: Secondary | ICD-10-CM

## 2023-01-12 MED ORDER — DENOSUMAB 60 MG/ML ~~LOC~~ SOSY
60.0000 mg | PREFILLED_SYRINGE | Freq: Once | SUBCUTANEOUS | Status: AC
  Administered 2023-01-12: 60 mg via SUBCUTANEOUS

## 2023-01-15 ENCOUNTER — Encounter: Payer: Self-pay | Admitting: Adult Health

## 2023-01-15 ENCOUNTER — Non-Acute Institutional Stay (INDEPENDENT_AMBULATORY_CARE_PROVIDER_SITE_OTHER): Payer: Medicare (Managed Care) | Admitting: Adult Health

## 2023-01-15 VITALS — BP 130/82 | HR 63 | Temp 97.7°F | Resp 16 | Ht 61.0 in | Wt 141.2 lb

## 2023-01-15 DIAGNOSIS — Z0000 Encounter for general adult medical examination without abnormal findings: Secondary | ICD-10-CM | POA: Diagnosis not present

## 2023-01-15 NOTE — Patient Instructions (Signed)
Traci Woodard , Thank you for taking time to come for your Medicare Wellness Visit. I appreciate your ongoing commitment to your health goals. Please review the following plan we discussed and let me know if I can assist you in the future.   Screening recommendations/referrals: Colonoscopy aged out Mammogram Sept 2024 Bone Density Sept 2025 Recommended yearly ophthalmology/optometry visit for glaucoma screening and checkup Recommended yearly dental visit for hygiene and checkup  Vaccinations: Influenza vaccine- due annually in September/October Pneumococcal vaccine up to date Tdap vaccine up to date Shingles vaccine up to date    Advanced directives: reviewed   Conditions/risks identified: NA  Next appointment: 1 year   Preventive Care 83 Years and Older, Female Preventive care refers to lifestyle choices and visits with your health care provider that can promote health and wellness. What does preventive care include? A yearly physical exam. This is also called an annual well check. Dental exams once or twice a year. Routine eye exams. Ask your health care provider how often you should have your eyes checked. Personal lifestyle choices, including: Daily care of your teeth and gums. Regular physical activity. Eating a healthy diet. Avoiding tobacco and drug use. Limiting alcohol use. Practicing safe sex. Taking low-dose aspirin every day. Taking vitamin and mineral supplements as recommended by your health care provider. What happens during an annual well check? The services and screenings done by your health care provider during your annual well check will depend on your age, overall health, lifestyle risk factors, and family history of disease. Counseling  Your health care provider may ask you questions about your: Alcohol use. Tobacco use. Drug use. Emotional well-being. Home and relationship well-being. Sexual activity. Eating habits. History of falls. Memory and  ability to understand (cognition). Work and work Astronomer. Reproductive health. Screening  You may have the following tests or measurements: Height, weight, and BMI. Blood pressure. Lipid and cholesterol levels. These may be checked every 5 years, or more frequently if you are over 43 years old. Skin check. Lung cancer screening. You may have this screening every year starting at age 35 if you have a 30-pack-year history of smoking and currently smoke or have quit within the past 15 years. Fecal occult blood test (FOBT) of the stool. You may have this test every year starting at age 53. Flexible sigmoidoscopy or colonoscopy. You may have a sigmoidoscopy every 5 years or a colonoscopy every 10 years starting at age 61. Hepatitis C blood test. Hepatitis B blood test. Sexually transmitted disease (STD) testing. Diabetes screening. This is done by checking your blood sugar (glucose) after you have not eaten for a while (fasting). You may have this done every 1-3 years. Bone density scan. This is done to screen for osteoporosis. You may have this done starting at age 27. Mammogram. This may be done every 1-2 years. Talk to your health care provider about how often you should have regular mammograms. Talk with your health care provider about your test results, treatment options, and if necessary, the need for more tests. Vaccines  Your health care provider may recommend certain vaccines, such as: Influenza vaccine. This is recommended every year. Tetanus, diphtheria, and acellular pertussis (Tdap, Td) vaccine. You may need a Td booster every 10 years. Zoster vaccine. You may need this after age 20. Pneumococcal 13-valent conjugate (PCV13) vaccine. One dose is recommended after age 18. Pneumococcal polysaccharide (PPSV23) vaccine. One dose is recommended after age 33. Talk to your health care provider about which screenings  and vaccines you need and how often you need them. This information is  not intended to replace advice given to you by your health care provider. Make sure you discuss any questions you have with your health care provider. Document Released: 07/02/2015 Document Revised: 02/23/2016 Document Reviewed: 04/06/2015 Elsevier Interactive Patient Education  2017 [employer].  Fall Prevention in the Home Falls can cause injuries. They can happen to people of all ages. There are many things you can do to make your home safe and to help prevent falls. What can I do on the outside of my home? Regularly fix the edges of walkways and driveways and fix any cracks. Remove anything that might make you trip as you walk through a door, such as a raised step or threshold. Trim any bushes or trees on the path to your home. Use bright outdoor lighting. Clear any walking paths of anything that might make someone trip, such as rocks or tools. Regularly check to see if handrails are loose or broken. Make sure that both sides of any steps have handrails. Any raised decks and porches should have guardrails on the edges. Have any leaves, snow, or ice cleared regularly. Use sand or salt on walking paths during winter. Clean up any spills in your garage right away. This includes oil or grease spills. What can I do in the bathroom? Use night lights. Install grab bars by the toilet and in the tub and shower. Do not use towel bars as grab bars. Use non-skid mats or decals in the tub or shower. If you need to sit down in the shower, use a plastic, non-slip stool. Keep the floor dry. Clean up any water that spills on the floor as soon as it happens. Remove soap buildup in the tub or shower regularly. Attach bath mats securely with double-sided non-slip rug tape. Do not have throw rugs and other things on the floor that can make you trip. What can I do in the bedroom? Use night lights. Make sure that you have a light by your bed that is easy to reach. Do not use any sheets or blankets that  are too big for your bed. They should not hang down onto the floor. Have a firm chair that has side arms. You can use this for support while you get dressed. Do not have throw rugs and other things on the floor that can make you trip. What can I do in the kitchen? Clean up any spills right away. Avoid walking on wet floors. Keep items that you use a lot in easy-to-reach places. If you need to reach something above you, use a strong step stool that has a grab bar. Keep electrical cords out of the way. Do not use floor polish or wax that makes floors slippery. If you must use wax, use non-skid floor wax. Do not have throw rugs and other things on the floor that can make you trip. What can I do with my stairs? Do not leave any items on the stairs. Make sure that there are handrails on both sides of the stairs and use them. Fix handrails that are broken or loose. Make sure that handrails are as long as the stairways. Check any carpeting to make sure that it is firmly attached to the stairs. Fix any carpet that is loose or worn. Avoid having throw rugs at the top or bottom of the stairs. If you do have throw rugs, attach them to the floor with carpet tape.  Make sure that you have a light switch at the top of the stairs and the bottom of the stairs. If you do not have them, ask someone to add them for you. What else can I do to help prevent falls? Wear shoes that: Do not have high heels. Have rubber bottoms. Are comfortable and fit you well. Are closed at the toe. Do not wear sandals. If you use a stepladder: Make sure that it is fully opened. Do not climb a closed stepladder. Make sure that both sides of the stepladder are locked into place. Ask someone to hold it for you, if possible. Clearly mark and make sure that you can see: Any grab bars or handrails. First and last steps. Where the edge of each step is. Use tools that help you move around (mobility aids) if they are needed. These  include: Canes. Walkers. Scooters. Crutches. Turn on the lights when you go into a dark area. Replace any light bulbs as soon as they burn out. Set up your furniture so you have a clear path. Avoid moving your furniture around. If any of your floors are uneven, fix them. If there are any pets around you, be aware of where they are. Review your medicines with your doctor. Some medicines can make you feel dizzy. This can increase your chance of falling. Ask your doctor what other things that you can do to help prevent falls. This information is not intended to replace advice given to you by your health care provider. Make sure you discuss any questions you have with your health care provider. Document Released: 04/01/2009 Document Revised: 11/11/2015 Document Reviewed: 07/10/2014 Elsevier Interactive Patient Education  2017 [employer].

## 2023-01-15 NOTE — Progress Notes (Signed)
Subjective:   Traci Woodard is a 82 y.o. female who presents for Medicare Annual (Subsequent) preventive examination at [facility], clinic setting.   Visit Complete: In person  Patient Medicare AWV questionnaire was completed by the patient on 01/15/23; I have confirmed that all information answered by patient is correct and no changes since this date.  Review of Systems     Cardiac Risk Factors include: advanced age (>61men, >4 women)     Objective:    Today's Vitals   01/15/23 1348 01/15/23 1351 01/15/23 1404  BP: 130/82    Pulse: 63    Resp: 16    Temp: 97.7 F (36.5 C)    TempSrc: Temporal    SpO2: 96%    Weight: 141 lb 3.2 oz (64 kg)    Height: 5\' 1"  (1.549 m)    PainSc:  0-No pain 0-No pain   Body mass index is 26.68 kg/m.     01/15/2023    1:50 PM 12/26/2022   10:04 AM 06/26/2022    3:27 PM 05/09/2022    3:21 PM 12/28/2021    8:05 AM 11/23/2021   11:06 AM 10/10/2021    2:49 PM  Advanced Directives  Does Patient Have a Medical Advance Directive? Yes Yes Yes Yes Yes Yes Yes  Type of Estate agent of [place];Living will;Out of facility DNR (pink MOST or yellow form) Healthcare Power of [place];Living will;Out of facility DNR (pink MOST or yellow form) Living will;Out of facility DNR (pink MOST or yellow form);Healthcare Power of Attorney Living will;Out of facility DNR (pink MOST or yellow form);Healthcare Power of [employer] of [place];Living will;Out of facility DNR (pink MOST or yellow form) Healthcare Power of [place];Living will Healthcare Power of [place];Living will  Does patient want to make changes to medical advance directive? No - Patient declined No - Patient declined   No - Patient declined No - Patient declined   Copy of Healthcare Power of Attorney in Chart?   Yes - validated most recent copy scanned in chart (See row information) Yes - validated most recent copy scanned in chart (See row  information) Yes - validated most recent copy scanned in chart (See row information) Yes - validated most recent copy scanned in chart (See row information) Yes - validated most recent copy scanned in chart (See row information)  Pre-existing out of facility DNR order (yellow form or pink MOST form)     Yellow form placed in chart (order not valid for inpatient use)      Current Medications (verified) Outpatient Encounter Medications as of 01/15/2023  Medication Sig   Calcium Carb-Ergocalciferol 500-200 MG-UNIT TABS Take 2 tablets by mouth daily.    Cholecalciferol (VITAMIN D3) 2000 units capsule Take 2,000 Units by mouth daily.   denosumab (PROLIA) 60 MG/ML SOLN injection Inject 60 mg into the skin every 6 (six) months. Administer in upper arm, thigh, or abdomen   losartan (COZAAR) 50 MG tablet Take 1 tablet (50 mg total) by mouth daily.   meloxicam (MOBIC) 15 MG tablet Take 15 mg by mouth 3 (three) times a week.   NON FORMULARY Antronex dietary supplement 1 per day   NON FORMULARY Seneplex take 1 tablet once daily   NON FORMULARY Bio-C-Plus, take tablet by mouth once daily   vitamin B-12 (CYANOCOBALAMIN) 1000 MCG tablet Take 1,000 mcg by mouth daily.   No facility-administered encounter medications on file as of 01/15/2023.    Allergies (verified) Dust mite  extract and Pollen extract   History: Past Medical History:  Diagnosis Date   Allergy    Ankle fracture, left    Baker cyst 04/19/2020   Bigeminy    Cataract    Colon polyps    Diverticulosis    FH: colon cancer    Menopausal and perimenopausal disorder    Multiple allergies    Osteoporosis 2008   PER DEXA SCAN   PVC (premature ventricular contraction)    Past Surgical History:  Procedure Laterality Date   APPENDECTOMY     CATARACT EXTRACTION W/ INTRAOCULAR LENS  IMPLANT, BILATERAL     IR US GUIDE BX ASP/DRAIN  05/12/2020   PERFORATED APPENDIX     SMALL BOWEL OBSTRUCTION     Family History  Problem Relation Age of  Onset   Colon cancer Mother    CAD Father    Dementia Father    Heart disease Father    Colon cancer Brother    Lung cancer Brother    Breast cancer Paternal Aunt    Social History   Socioeconomic History   Marital status: Married    Spouse name: Not on file   Number of children: Not on file   Years of education: Not on file   Highest education level: Not on file  Occupational History   Occupation: Retired    Comment: Programmer, systems  Tobacco Use   Smoking status: Never   Smokeless tobacco: Never  Vaping Use   Vaping status: Never Used  Substance and Sexual Activity   Alcohol use: Yes    Comment: occassiionally   Drug use: No   Sexual activity: Not on file  Other Topics Concern   Not on file  Social History Narrative   Tobacco use, amount per day now:0   Past tobacco use, amount per day:0   How many years did you use tobacco:0   Alcohol use (drinks per week):7-14   Diet:FRUITS, VEGETABLES, YOGURT, LEAN MEAT AND FISH, FEW BREADS AND DESSERTS    Do you drink/eat things with caffeine:YES, COFFEE, CHOCOLATE   Marital status: MARRIED                               What year were you married? 1980   Do you live in a house, apartment, assisted living, condo, trailer, etc.? GARDEN HOME   Is it one or more stories? ONE   How many persons live in your home? 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   YES                               Type and how often? WALK AND STRETCH DAILY   Do you have a living will? YES   Do you have a DNR form?  I THINK SO              If not, do you want to discuss one?   Do you have signed POA/HPOA forms?  YES                      If so, please bring to you appointment   Social  Determinants of Health   Financial Resource Strain: Low Risk  (02/26/2018)   Overall  Financial Resource Strain (CARDIA)    Difficulty of Paying Living Expenses: Not hard at all  Food Insecurity: No Food Insecurity (02/26/2018)   Hunger Vital Sign    Worried About Running Out of Food in the Last Year: Never true    Ran Out of Food in the Last Year: Never true  Transportation Needs: No Transportation Needs (02/26/2018)   PRAPARE - Administrator, Civil Service (Medical): No    Lack of Transportation (Non-Medical): No  Physical Activity: Insufficiently Active (02/26/2018)   Exercise Vital Sign    Days of Exercise per Week: 7 days    Minutes of Exercise per Session: 20 min  Stress: No Stress Concern Present (02/26/2018)   [employer] of Occupational Health - Occupational Stress Questionnaire    Feeling of Stress : Only a little  Social Connections: Moderately Integrated (02/26/2018)   Social Connection and Isolation Panel [NHANES]    Frequency of Communication with Friends and Family: More than three times a week    Frequency of Social Gatherings with Friends and Family: More than three times a week    Attends Religious Services: More than 4 times per year    Active Member of [employer] or Organizations: No    Attends Engineer, structural: Never    Marital Status: Married    Tobacco Counseling Counseling given: Not Answered   Clinical Intake:  Pre-visit preparation completed: No  Pain : No/denies pain Pain Score: 0-No pain     BMI - recorded: 26.68 Nutritional Status: BMI 25 -29 Overweight Nutritional Risks: None Diabetes: No  How often do you need to have someone help you when you read instructions, pamphlets, or other written materials from your doctor or pharmacy?: 1 - Never What 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 0  Vision? 1  Difficulty concentrating or making decisions? 0  Walking or climbing stairs? 0  Dressing or bathing? 0  Doing errands, shopping? 0  Using the Toilet? N  In the past six months, have you accidently leaked urine? Y  Managing your Medications? N  Managing your Finances? N  Housekeeping or managing your Housekeeping? N    Patient Care Team: Mahlon Gammon, MD as PCP - General (Internal Medicine) Cherlyn Roberts, MD as Consulting Physician (Dermatology) Charolett Bumpers, MD as Consulting Physician (Gastroenterology) Sidney Ace, MD as Referring Physician (Allergy) Quintella Reichert, MD as Consulting Physician (Cardiology)  Indicate any recent Medical Services you may have received from other than [facility] providers in the past year (date may be approximate).     Assessment:   This is a routine wellness examination for Unnamed.  Hearing/Vision screen Hearing Screening - Comments:: Patient states she has not Vision Screening - Comments:: Patient states yes Dr Charlotte Sanes   Dietary issues and exercise activities discussed:     Goals Addressed   None    Depression Screen    01/15/2023    1:50 PM 12/26/2022   10:04 AM 06/26/2022    3:26 PM 05/09/2022    3:21 PM 12/28/2021    9:03 AM 10/10/2021    2:50  PM 10/10/2021    2:27 PM  PHQ 2/9 Scores  PHQ - 2 Score 0 0 0 0 0 0 0    Fall Risk    01/15/2023    1:49 PM 12/26/2022   10:04 AM 06/26/2022    3:26 PM 05/09/2022    3:21 PM 12/28/2021    9:02 AM  Fall Risk   Falls in the past year? 0 0 0 0 0  Number falls in past yr: 0 0 0 0 0  Injury with Fall? 0 0 0 0 0  Risk for fall due to : No Fall Risks No Fall Risks No Fall Risks No Fall Risks No Fall Risks  Follow up Falls evaluation completed Falls evaluation completed Falls evaluation completed Falls evaluation completed Falls evaluation completed    MEDICARE RISK AT HOME:   TIMED UP AND GO:  Was the test performed?  Yes   Length of time to ambulate 10 feet: 12 sec Gait steady and fast without use of assistive device    Cognitive Function:    01/15/2023    1:56 PM 10/11/2021    8:17 AM 02/26/2018    3:46 PM 01/17/2017    9:04 AM 02/16/2016   10:21 AM  MMSE - Mini Mental State Exam  Orientation to time 5 5 5 5 5   Orientation to Place 5 5 5 5 5   Registration 3 3 3 3 3   Attention/ Calculation 5 5 5 5 5   Recall 3 3 3 2 3   Language- name 2 objects 2 2 2 2 2   Language- repeat 1 1 1 1 1   Language- follow 3 step command 3 3 3 3 3   Language- read & follow direction 1 1 1 1 1   Write a sentence 1 1 1 1 1   Copy design 1 1 1 1 1   Total score 30 30 30 29 30         06/17/2020    9:01 AM 04/17/2019    1:13 PM  6CIT Screen  What Year? 0 points 0 points  What month? 0 points 0 points  What time? 0 points 0 points  Count back from 20 0 points 0 points  Months in reverse 0 points 0 points  Repeat phrase 0 points 0 points  Total Score 0 points 0 points    Immunizations Immunization History  Administered Date(s) Administered   Covid-19, Mrna,Vaccine(Spikevax)49yrs and older 10/12/2022   Fluad Quad(high Dose 65+) 03/20/2022   Influenza, High Dose Seasonal PF 01/18/2015, 03/28/2019, 04/16/2020   Influenza,inj,Quad PF,6+ Mos 04/24/2016, 04/12/2018   Influenza-Unspecified 04/09/2017, 04/16/2020, 02/17/2021, 04/08/2021   Moderna Covid-19 Vaccine Bivalent Booster 106yrs & up 04/24/2022   Moderna SARS-COV2 Booster Vaccination 10/19/2020, 04/01/2021   Moderna Sars-Covid-2 Vaccination 07/01/2019, 07/29/2019, 05/04/2020   Pneumococcal Conjugate-13 09/22/2013   Pneumococcal Polysaccharide-23 09/02/2004, 09/18/2011   Tdap 09/18/2011, 10/10/2021   Zoster Recombinant(Shingrix) 08/21/2017, 11/12/2017   Zoster, Live 08/19/2008    TDAP status: Up to date  Flu Vaccine status: Up to date  Pneumococcal vaccine status: Up to date  Covid-19 vaccine status: Completed vaccines  Qualifies for Shingles Vaccine? Yes    Zostavax completed Yes   Shingrix Completed?: Yes  Screening Tests Health Maintenance  Topic Date Due   INFLUENZA VACCINE  01/18/2023   COVID-19 Vaccine (8 - 2023-24 season) 02/11/2023   MAMMOGRAM  03/01/2023   Medicare Annual Wellness (AWV)  01/15/2024   DTaP/Tdap/Td (3 - Td or Tdap) 10/11/2031   Pneumonia Vaccine 67+ Years old  Completed  DEXA SCAN  Completed   Zoster Vaccines- Shingrix  Completed   HPV VACCINES  Aged Out   Colonoscopy  Discontinued    Health Maintenance  There are no preventive care reminders to display for this patient.   Colorectal cancer screening: No longer required.   Mammogram status: Completed 02/2022. Repeat every year  Bone Density status: Completed 02/2022. Results reflect: Bone density results: OSTEOPOROSIS. Repeat every 2 years.  Lung Cancer Screening: (Low Dose CT Chest recommended if Age 5-80 years, 20 pack-year currently smoking OR have quit w/in 15years.) does not qualify.   Lung Cancer Screening Referral: NA  Additional Screening:  Hepatitis C Screening: does not qualify; Completed NA  Vision Screening: Recommended annual ophthalmology exams for early detection of glaucoma and other disorders of the eye. Is the patient up to date with their annual eye exam?  Yes  Who is the provider or what is the name of the office in which the patient attends annual eye exams? [facility] If pt is not established with a provider, would they like to be referred to a provider to establish care? No .   Dental Screening: Recommended annual dental exams for proper oral hygiene  Diabetic Foot Exam not diabetic  Community Resource Referral / Chronic Care Management: CRR required this visit?  No   CCM required this visit?  No     Plan:     I have personally reviewed and noted the following in the patient's chart:   Medical and social history Use of alcohol, tobacco or illicit drugs  Current medications and supplements including opioid  prescriptions. Patient is not currently taking opioid prescriptions. Functional ability and status Nutritional status Physical activity Advanced directives List of other physicians Hospitalizations, surgeries, and ER visits in previous 12 months Vitals Screenings to include cognitive, depression, and falls Referrals and appointments  In addition, I have reviewed and discussed with patient certain preventive protocols, quality metrics, and best practice recommendations. A written personalized care plan for preventive services as well as general preventive health recommendations were provided to patient.     Fletcher Anon, NP   01/15/2023   After Visit Summary: given to patient   Nurse Notes: NA

## 2023-01-30 ENCOUNTER — Other Ambulatory Visit: Payer: Self-pay | Admitting: Internal Medicine

## 2023-01-30 DIAGNOSIS — Z1231 Encounter for screening mammogram for malignant neoplasm of breast: Secondary | ICD-10-CM

## 2023-03-02 ENCOUNTER — Ambulatory Visit
Admission: RE | Admit: 2023-03-02 | Discharge: 2023-03-02 | Disposition: A | Discharge status: Home / Self Care | Payer: Medicare (Managed Care) | Source: Ambulatory Visit | Attending: Internal Medicine | Admitting: Internal Medicine

## 2023-03-02 ENCOUNTER — Telehealth: Payer: Medicare (Managed Care) | Admitting: *Deleted

## 2023-03-02 DIAGNOSIS — Z1231 Encounter for screening mammogram for malignant neoplasm of breast: Secondary | ICD-10-CM | POA: Diagnosis not present

## 2023-03-02 NOTE — Telephone Encounter (Signed)
Patient called requesting Advise on upcoming Vaccines for herself and her HUSBAND.   Stated that she is at [facility] and they are going to have a Covid Vaccination Clinic on 03/22/2023.   Patient is wanting to know if she and her Husband can also request getting the Flu and the RSV at the same time.  Patient stated that she wasn't sure if she could get all 3, Covid Flu RSV, the same day.   Please Advise.

## 2023-03-06 NOTE — Telephone Encounter (Signed)
Yes you can all 3 vaccines at the same time.

## 2023-03-06 NOTE — Telephone Encounter (Signed)
Patient notified and agreed.

## 2023-03-27 ENCOUNTER — Non-Acute Institutional Stay: Payer: Medicare (Managed Care) | Admitting: Internal Medicine

## 2023-03-27 ENCOUNTER — Encounter: Payer: Self-pay | Admitting: Internal Medicine

## 2023-03-27 VITALS — BP 134/86 | HR 67 | Temp 97.1°F | Ht 61.0 in | Wt 139.0 lb

## 2023-03-27 DIAGNOSIS — Z7189 Other specified counseling: Secondary | ICD-10-CM | POA: Diagnosis not present

## 2023-03-27 DIAGNOSIS — M1712 Unilateral primary osteoarthritis, left knee: Secondary | ICD-10-CM

## 2023-03-27 DIAGNOSIS — M270 Developmental disorders of jaws: Secondary | ICD-10-CM | POA: Diagnosis not present

## 2023-03-27 NOTE — Progress Notes (Signed)
Location: [facility] of Service:  Clinic (12)  Provider:   Code Status: DNR Goals of Care:     03/27/2023   10:47 AM  Advanced Directives  Does Patient Have a Medical Advance Directive? Yes  Type of Advance Directive Out of facility DNR (pink MOST or yellow form)  Does patient want to make changes to medical advance directive? No - Patient declined     Chief Complaint  Patient presents with   Medical Management of Chronic Issues    Medical Management of Chronic Issues.     HPI: Patient is a 83 y.o. female seen today for an acute visit for Follow up of her Growth on her [place]  Lives in [place] Husband   Patient has h/o Osteoporosis ON Prolia Left Lower leg Swelling since 05/21. [place] was negative  Does have h/o Left Ankle Fracture in 2009 Dopplers Negative H/o Left knee Bakers Cyst and Bursitis per Ortho TMJ arthritis Caregiver stress takes care of her husband with dementia   Doing well Caregiver stress but  doing well   Past Medical History:  Diagnosis Date   Allergy    Ankle fracture, left    Baker cyst 04/19/2020   Bigeminy    Cataract    Colon polyps    Diverticulosis    FH: colon cancer    Menopausal and perimenopausal disorder    Multiple allergies    Osteoporosis 2008   PER DEXA SCAN   PVC (premature ventricular contraction)     Past Surgical History:  Procedure Laterality Date   APPENDECTOMY     CATARACT EXTRACTION W/ INTRAOCULAR LENS  IMPLANT, BILATERAL     IR US GUIDE BX ASP/DRAIN  05/12/2020   PERFORATED APPENDIX     SMALL BOWEL OBSTRUCTION      Allergies  Allergen Reactions   Dust Mite Extract    Pollen Extract     Outpatient Encounter Medications as of 03/27/2023  Medication Sig   Calcium Carb-Ergocalciferol 500-200 MG-UNIT TABS Take 2 tablets by mouth daily.    Cholecalciferol (VITAMIN D3) 2000 units capsule Take 2,000 Units by mouth daily.   denosumab (PROLIA) 60 MG/ML SOLN injection Inject 60  mg into the skin every 6 (six) months. Administer in upper arm, thigh, or abdomen   losartan (COZAAR) 50 MG tablet Take 1 tablet (50 mg total) by mouth daily.   NON FORMULARY Antronex dietary supplement 1 per day   NON FORMULARY Seneplex take 1 tablet once daily   NON FORMULARY Bio-C-Plus, take tablet by mouth once daily   vitamin B-12 (CYANOCOBALAMIN) 1000 MCG tablet Take 1,000 mcg by mouth daily.   [DISCONTINUED] meloxicam (MOBIC) 15 MG tablet Take 15 mg by mouth daily as needed.   No facility-administered encounter medications on file as of 03/27/2023.    Review of Systems:  Review of Systems  Constitutional:  Negative for activity change and appetite change.  HENT: Negative.    Respiratory:  Negative for cough and shortness of breath.   Cardiovascular:  Positive for leg swelling.  Gastrointestinal:  Negative for constipation.  Genitourinary: Negative.   Musculoskeletal:  Negative for arthralgias, gait problem and myalgias.  Skin: Negative.   Neurological:  Negative for dizziness and weakness.  Psychiatric/Behavioral:  Negative for confusion, dysphoric mood and sleep disturbance.     Health Maintenance  Topic Date Due   Medicare Annual Wellness (AWV)  01/15/2024   MAMMOGRAM  03/01/2024   DTaP/Tdap/Td (3 - Td or  Tdap) 10/11/2031   Pneumonia Vaccine 24+ Years old  Completed   INFLUENZA VACCINE  Completed   DEXA SCAN  Completed   COVID-19 Vaccine  Completed   Zoster Vaccines- Shingrix  Completed   HPV VACCINES  Aged Out   Colonoscopy  Discontinued    Physical Exam: Vitals:   03/27/23 1044  BP: 134/86  Pulse: 67  Temp: (!) 97.1 F (36.2 C)  SpO2: 96%  Weight: 139 lb (63 kg)  Height: 5\' 1"  (1.549 m)   Body mass index is 26.26 kg/m. Physical Exam Vitals reviewed.  Constitutional:      Appearance: Normal appearance.  HENT:     Head: Normocephalic.     Nose: Nose normal.     Mouth/Throat:     Mouth: Mucous membranes are moist.     Pharynx: Oropharynx is clear.      Comments: No Change in the Palate mass Most Likely Torus Palatinus Eyes:     Pupils: Pupils are equal, round, and reactive to light.  Cardiovascular:     Rate and Rhythm: Normal rate and regular rhythm.     Pulses: Normal pulses.     Heart sounds: Normal heart sounds. No murmur heard. Pulmonary:     Effort: Pulmonary effort is normal.     Breath sounds: Normal breath sounds.  Abdominal:     General: Abdomen is flat. Bowel sounds are normal.     Palpations: Abdomen is soft.  Musculoskeletal:        General: No swelling.     Cervical back: Neck supple.  Skin:    General: Skin is warm.  Neurological:     General: No focal deficit present.     Mental Status: She is alert and oriented to person, place, and time.  Psychiatric:        Mood and Affect: Mood normal.        Thought Content: Thought content normal.    Labs reviewed: Basic Metabolic Panel: Recent Labs    12/19/22 0000  NA 139  K 4.3  CL 103  CO2 23*  BUN 12  CREATININE 0.8  CALCIUM 9.3   Liver Function Tests: Recent Labs    12/19/22 0000  AST 27  ALT 23  ALKPHOS 62  ALBUMIN 3.8   No results for input(s): "LIPASE", "AMYLASE" in the last 8760 hours. No results for input(s): "AMMONIA" in the last 8760 hours. CBC: No results for input(s): "WBC", "NEUTROABS", "HGB", "HCT", "MCV", "PLT" in the last 8760 hours. Lipid Panel: Recent Labs    12/19/22 0000  CHOL 244*  HDL 74*  LDLCALC 154  TRIG 80   Lab Results  Component Value Date   HGBA1C 5.4 10/29/2014    Procedures since last visit: MM 3D SCREENING MAMMOGRAM BILATERAL BREAST  Result Date: 03/05/2023 CLINICAL DATA:  Screening. EXAM: DIGITAL SCREENING BILATERAL MAMMOGRAM WITH TOMOSYNTHESIS AND CAD TECHNIQUE: Bilateral screening digital craniocaudal and mediolateral oblique mammograms were obtained. Bilateral screening digital breast tomosynthesis was performed. The images were evaluated with computer-aided detection. COMPARISON:  Previous  exam(s). ACR Breast Density Category b: There are scattered areas of fibroglandular density. FINDINGS: There are no findings suspicious for malignancy. IMPRESSION: No mammographic evidence of malignancy. A result letter of this screening mammogram will be mailed directly to the patient. RECOMMENDATION: Screening mammogram in one year. (Code:SM-B-01Y) BI-RADS CATEGORY  1: Negative. Electronically Signed   By: Sherian Rein M.D.   On: 03/05/2023 11:51    Assessment/Plan 1. Torus palatinus No Change in  Growth Continue to follow  2. Primary osteoarthritis of left knee Discontinue Meloxicam No Benefits She has No pain   3. DNR (do not resuscitate) discussion  Other issues Senile osteoporosis On Prolia per [facility] offcie T score -2.6 in 2023 Pure hypercholesterolemia Ratio is good Continue Diet and Exercise     Lymphedema of left leg Ted hoses    Caregiver stress Doing well in Care group in [place] Hypertension On Cozaar  Doing well      Labs/tests ordered:   Next appt:  07/20/2023

## 2023-05-25 ENCOUNTER — Other Ambulatory Visit: Payer: Self-pay | Admitting: Internal Medicine

## 2023-05-25 DIAGNOSIS — M810 Age-related osteoporosis without current pathological fracture: Secondary | ICD-10-CM

## 2023-05-25 MED ORDER — DENOSUMAB 60 MG/ML ~~LOC~~ SOSY
60.0000 mg | PREFILLED_SYRINGE | Freq: Once | SUBCUTANEOUS | Status: AC
  Administered 2023-07-20: 60 mg via SUBCUTANEOUS

## 2023-06-11 ENCOUNTER — Emergency Department (HOSPITAL_BASED_OUTPATIENT_CLINIC_OR_DEPARTMENT_OTHER): Payer: Medicare (Managed Care)

## 2023-06-11 ENCOUNTER — Other Ambulatory Visit: Payer: Self-pay

## 2023-06-11 ENCOUNTER — Encounter (HOSPITAL_BASED_OUTPATIENT_CLINIC_OR_DEPARTMENT_OTHER): Payer: Self-pay | Admitting: Emergency Medicine

## 2023-06-11 ENCOUNTER — Emergency Department (HOSPITAL_BASED_OUTPATIENT_CLINIC_OR_DEPARTMENT_OTHER)
Admission: EM | Admit: 2023-06-11 | Discharge: 2023-06-11 | Disposition: A | Discharge status: Home / Self Care | Payer: Medicare (Managed Care) | Attending: Emergency Medicine | Admitting: Emergency Medicine

## 2023-06-11 DIAGNOSIS — S0990XA Unspecified injury of head, initial encounter: Secondary | ICD-10-CM | POA: Diagnosis not present

## 2023-06-11 DIAGNOSIS — W19XXXA Unspecified fall, initial encounter: Secondary | ICD-10-CM

## 2023-06-11 DIAGNOSIS — W01198A Fall on same level from slipping, tripping and stumbling with subsequent striking against other object, initial encounter: Secondary | ICD-10-CM | POA: Insufficient documentation

## 2023-06-11 DIAGNOSIS — I10 Essential (primary) hypertension: Secondary | ICD-10-CM | POA: Insufficient documentation

## 2023-06-11 DIAGNOSIS — R9082 White matter disease, unspecified: Secondary | ICD-10-CM | POA: Diagnosis not present

## 2023-06-11 DIAGNOSIS — R002 Palpitations: Secondary | ICD-10-CM | POA: Diagnosis not present

## 2023-06-11 DIAGNOSIS — S0993XA Unspecified injury of face, initial encounter: Secondary | ICD-10-CM | POA: Diagnosis present

## 2023-06-11 DIAGNOSIS — R58 Hemorrhage, not elsewhere classified: Secondary | ICD-10-CM | POA: Diagnosis not present

## 2023-06-11 DIAGNOSIS — Z79899 Other long term (current) drug therapy: Secondary | ICD-10-CM | POA: Insufficient documentation

## 2023-06-11 DIAGNOSIS — R000 Tachycardia, unspecified: Secondary | ICD-10-CM | POA: Diagnosis not present

## 2023-06-11 DIAGNOSIS — S0181XA Laceration without foreign body of other part of head, initial encounter: Secondary | ICD-10-CM | POA: Diagnosis not present

## 2023-06-11 DIAGNOSIS — I491 Atrial premature depolarization: Secondary | ICD-10-CM | POA: Diagnosis not present

## 2023-06-11 DIAGNOSIS — R220 Localized swelling, mass and lump, head: Secondary | ICD-10-CM | POA: Diagnosis not present

## 2023-06-11 MED ORDER — LIDOCAINE-EPINEPHRINE-TETRACAINE (LET) TOPICAL GEL
3.0000 mL | Freq: Once | TOPICAL | Status: AC
  Administered 2023-06-11: 3 mL via TOPICAL
  Filled 2023-06-11: qty 3

## 2023-06-11 NOTE — ED Triage Notes (Signed)
Tripped at grocery store  Lac to forehead Denies LOC Brought by EMS spinal cleared  From [facility] Reports tetanus is UTD

## 2023-06-11 NOTE — ED Notes (Signed)
Patient transported to CT 

## 2023-06-11 NOTE — ED Notes (Signed)
 RN reviewed discharge instructions with pt. Pt verbalized understanding and had no further questions. VSS upon discharge.  

## 2023-06-11 NOTE — ED Notes (Signed)
Pt bib [facility], reports fall at grocery store. Frontal head injury, lac on forehead. Denies thinners 108, 170/84 97% RA GCS 15

## 2023-06-11 NOTE — Discharge Instructions (Signed)
Your history, exam, and evaluation today are consistent with minor head injury and laceration to the forehead.  CT scan did not show skull fracture or intracranial bleeding or other facial fracture.  We repaired the laceration with absorbable sutures so you do not need to have them removed.  We cleaned it well and your tetanus is up-to-date.  Please watch for signs and symptoms of infection and follow-up with your primary doctor.  If any symptoms change or worsen acutely, please return to the nearest emergency department.

## 2023-06-11 NOTE — ED Provider Notes (Signed)
[place] EMERGENCY DEPARTMENT AT [facility] Provider Note   CSN: [id number] Arrival date & time: 06/11/23  1626     History  Chief Complaint  Patient presents with   Traci Woodard    [name] is a 82 y.o. female.  The history is provided by the patient and medical records. No language interpreter was used.  Fall This is a new problem. The current episode started less than 1 hour ago. The problem occurs rarely. The problem has not changed since onset.Associated symptoms include headaches (mild). Pertinent negatives include no chest pain, no abdominal pain and no shortness of breath. Nothing aggravates the symptoms. Nothing relieves the symptoms. She has tried nothing for the symptoms. The treatment provided no relief.       Home Medications Prior to Admission medications   Medication Sig Start Date End Date Taking? Authorizing Provider  Calcium Carb-Ergocalciferol 500-200 MG-UNIT TABS Take 2 tablets by mouth daily.     [provider]  Cholecalciferol (VITAMIN D3) 2000 units capsule Take 2,000 Units by mouth daily.    [provider]  denosumab (PROLIA) 60 MG/ML SOLN injection Inject 60 mg into the skin every 6 (six) months. Administer in upper arm, thigh, or abdomen    [provider]  losartan (COZAAR) 50 MG tablet Take 1 tablet (50 mg total) by mouth daily. 10/16/22   Mahlon Gammon, MD  NON FORMULARY Antronex dietary supplement 1 per day    [provider]  NON FORMULARY Seneplex take 1 tablet once daily    [provider]  NON FORMULARY Bio-C-Plus, take tablet by mouth once daily    [provider]  vitamin B-12 (CYANOCOBALAMIN) 1000 MCG tablet Take 1,000 mcg by mouth daily.    [provider]      Allergies    Dust mite extract and Pollen extract    Review of Systems   Review of Systems  Constitutional:  Negative for chills, fatigue and fever.  HENT:  Negative for congestion.   Eyes:  Negative  for visual disturbance.  Respiratory:  Negative for cough, chest tightness and shortness of breath.   Cardiovascular:  Negative for chest pain.  Gastrointestinal:  Negative for abdominal pain, constipation, diarrhea, nausea and vomiting.  Genitourinary:  Negative for flank pain.  Musculoskeletal:  Negative for back pain, neck pain and neck stiffness.  Skin:  Positive for wound. Negative for rash.  Neurological:  Positive for headaches (mild).  Psychiatric/Behavioral:  Negative for agitation and confusion.   All other systems reviewed and are negative.   Physical Exam Updated Vital Signs BP (!) 172/70 (BP Location: Left Leg)   Pulse (!) 42   Temp 98.8 F (37.1 C)   Resp 17   SpO2 97%  Physical Exam Vitals and nursing note reviewed.  Constitutional:      General: She is not in acute distress.    Appearance: She is well-developed. She is not ill-appearing, toxic-appearing or diaphoretic.  HENT:     Head: Contusion and laceration present. No raccoon eyes.     Jaw: No tenderness.      Comments: Bruising on bridge of nose, laceration just above right eyebrow.  Approximately 1.5 cm stellate laceration.    Nose: No congestion or rhinorrhea.     Mouth/Throat:     Mouth: Mucous membranes are dry.     Pharynx: No oropharyngeal exudate or posterior oropharyngeal erythema.  Eyes:     Extraocular Movements: Extraocular movements intact.  Conjunctiva/sclera: Conjunctivae normal.     Pupils: Pupils are equal, round, and reactive to light.  Cardiovascular:     Rate and Rhythm: Normal rate and regular rhythm.     Heart sounds: No murmur heard. Pulmonary:     Effort: Pulmonary effort is normal. No respiratory distress.     Breath sounds: Normal breath sounds. No wheezing, rhonchi or rales.  Chest:     Chest wall: No tenderness.  Abdominal:     General: Abdomen is flat.     Palpations: Abdomen is soft.     Tenderness: There is no abdominal tenderness.  Musculoskeletal:         General: Tenderness present. No swelling.     Cervical back: Neck supple.     Right lower leg: No edema.     Left lower leg: No edema.  Skin:    General: Skin is warm and dry.     Capillary Refill: Capillary refill takes less than 2 seconds.     Findings: No erythema or rash.  Neurological:     General: No focal deficit present.     Mental Status: She is alert.  Psychiatric:        Mood and Affect: Mood normal.     ED Results / Procedures / Treatments   Labs (all labs ordered are listed, but only abnormal results are displayed) Labs Reviewed - No data to display  EKG None  Radiology No results found.  Procedures .Laceration Repair  Date/Time: 06/11/2023 10:12 PM  Performed by: Heide Scales, MD Authorized by: Heide Scales, MD   Consent:    Consent obtained:  Verbal   Consent given by:  Patient   Risks, benefits, and alternatives were discussed: yes     Risks discussed:  Infection, pain, poor cosmetic result and poor wound healing   Alternatives discussed:  No treatment Universal protocol:    Immediately prior to procedure, a time out was called: yes     Patient identity confirmed:  Verbally with patient Anesthesia:    Anesthesia method:  Topical application   Topical anesthetic:  LET Laceration details:    Location:  Face   Face location:  Forehead   Length (cm):  1.5   Depth (mm):  2 Pre-procedure details:    Preparation:  Patient was prepped and draped in usual sterile fashion and imaging obtained to evaluate for foreign bodies Exploration:    Limited defect created (wound extended): no     Hemostasis achieved with:  LET   Imaging outcome: foreign body not noted     Wound exploration: wound explored through full range of motion and entire depth of wound visualized   Treatment:    Area cleansed with:  Chlorhexidine and Shur-Clens   Amount of cleaning:  Standard   Debridement:  None   Undermining:  None Skin repair:    Repair method:   Sutures   Suture size:  5-0   Suture material:  Fast-absorbing gut   Suture technique:  Simple interrupted   Number of sutures:  7 Approximation:    Approximation:  Close Repair type:    Repair type:  Simple Post-procedure details:    Dressing:  Antibiotic ointment and non-adherent dressing   Procedure completion:  Tolerated     Medications Ordered in ED Medications  lidocaine-EPINEPHrine-tetracaine (LET) topical gel (3 mLs Topical Given 06/11/23 1833)    ED Course/ Medical Decision Making/ A&P  Medical Decision Making Amount and/or Complexity of Data Reviewed Radiology: ordered.    SYNIAH DIENES is a 82 y.o. female with a past medical history significant for osteoporosis, hypertension, hyperlipidemia, and diverticulosis who presents with fall and head injury.  According to patient, she was at the grocery store getting the last item when she tripped over a black foot pad and fell forward onto the ground.  She hit her nose and her forehead and sustained a laceration that was bleeding.  Patient was sent in for evaluation.  She reports mild headache that is improving.  Denies any neck pain chest pain or abdominal pain.  No hip pains.  She reports some pain in her right hand and she is right-handed but she reports it does not bother her and she does not want further workup of it.  Denies any preceding symptoms and specifically denies fevers, chills, congestion, cough, nausea, vomiting, constipation, diarrhea, or urinary changes.  Patient otherwise is feeling well.  On exam, lungs clear.  Chest nontender.  Abdomen nontender.  Mild tenderness in the hypothenar area of her right hand but no significant bruising or crepitance.  Good grip strength, sensation, strength, and pulses.  Good cap refill.  No laceration on the hand.  Patient does not have tenderness of the neck or the lower face but does have some bruising on her nose with some tenderness.  No nasal  septal hematoma seen.  Pupils are symmetric and reactive with normal extract movements.  Patient has a full laceration just above her right eyebrow that is about 1.5 cm in length and is somewhat stellate in appearance.  Oozing some blood but no arterial bleeding seen.  Exam otherwise unremarkable.  Will get CT of the head and face and apply let cream to the wound.  Patient's tetanus is up-to-date and confirmed per the chart.  Given lack of preceding symptoms we will hold on more extensive workup but will get the imaging and then manage her wound.  Anticipate discharge after wound repair and imaging completion.      Wound was repaired without difficulty with his verbal sutures.  She will follow-up with her PCP.  We discussed return precautions and follow-up instructions and patient was discharged in good condition.         Final Clinical Impression(s) / ED Diagnoses Final diagnoses:  Laceration of forehead, initial encounter  Fall, initial encounter    Rx / DC Orders ED Discharge Orders     None       Clinical Impression: 1. Laceration of forehead, initial encounter   2. Fall, initial encounter     Disposition: Discharge  Condition: Good  I have discussed the results, Dx and Tx plan with the pt(& family if present). He/she/they expressed understanding and agree(s) with the plan. Discharge instructions discussed at great length. Strict return precautions discussed and pt &/or family have verbalized understanding of the instructions. No further questions at time of discharge.    New Prescriptions   No medications on file    Follow Up: Mahlon Gammon, MD [address] [place] [address] [phone]     [facility] Emergency Department at [facility] [address] [place] [address] [phone]       Eryn Krejci, Canary Brim, MD 06/11/23 2213

## 2023-06-28 ENCOUNTER — Telehealth: Payer: Self-pay | Admitting: *Deleted

## 2023-06-28 NOTE — Telephone Encounter (Signed)
 Submitted Verification through [employer] for patient's Prolia  Injection.  Verification came back that Patient's policy Traci Woodard was Terminated on 06/19/2023  Called patient to update new Insurance. Patient confirmed termination and stated that she now has [facility] Medicare Advantage ID: [id number] Group: [id number] [phone]  Called [employer] and Aloha helped update the Insurance in [employer] and sent for Re Verification.   Awaiting Determination.

## 2023-07-03 ENCOUNTER — Non-Acute Institutional Stay: Payer: Medicare Other | Admitting: Internal Medicine

## 2023-07-03 VITALS — BP 132/88 | HR 77 | Temp 97.6°F | Resp 17 | Ht 61.0 in | Wt 140.2 lb

## 2023-07-03 DIAGNOSIS — I890 Lymphedema, not elsewhere classified: Secondary | ICD-10-CM | POA: Diagnosis not present

## 2023-07-03 DIAGNOSIS — M810 Age-related osteoporosis without current pathological fracture: Secondary | ICD-10-CM | POA: Diagnosis not present

## 2023-07-03 DIAGNOSIS — W19XXXS Unspecified fall, sequela: Secondary | ICD-10-CM

## 2023-07-03 NOTE — Progress Notes (Signed)
 Location: [facility] Editor of Service:  Clinic (12)  Provider:   Code Status:  Goals of Care:     07/03/2023    8:21 AM  Advanced Directives  Does Patient Have a Medical Advance Directive? Yes  Type of Estate Agent of [place];Out of facility DNR (pink MOST or yellow form);Living will  Does patient want to make changes to medical advance directive? No - Patient declined  Copy of Healthcare Power of Attorney in Chart? Yes - validated most recent copy scanned in chart (See row information)     Chief Complaint  Patient presents with   Acute Visit    Patient is here for a fall    HPI: Patient is a 83 y.o. female seen today for an acute visit for Follow up after fall Lives in [place] with her Husband   Was shopping in [place] when tripped  on the Box Went to ED CT of Face Negative CT of Head Chronic Vessel Changes and Soft tissue swelling No Acute changes  Had Laceration in her Forehead which healed ECG strip from EMS showed PACS which patient was worried about Also BP Was high Patient wanted to make sure she is OK Denied any dizziness Laceration has now healed No other issues  Past Medical History:  Diagnosis Date   Allergy    Ankle fracture, left    Baker cyst 04/19/2020   Bigeminy    Cataract    Colon polyps    Diverticulosis    FH: colon cancer    Menopausal and perimenopausal disorder    Multiple allergies    Osteoporosis 2008   PER DEXA SCAN   PVC (premature ventricular contraction)     Past Surgical History:  Procedure Laterality Date   APPENDECTOMY     CATARACT EXTRACTION W/ INTRAOCULAR LENS  IMPLANT, BILATERAL     IR US  GUIDE BX ASP/DRAIN  05/12/2020   PERFORATED APPENDIX     SMALL BOWEL OBSTRUCTION      Allergies  Allergen Reactions   Dust Mite Extract    Pollen Extract     Outpatient Encounter Medications as of 07/03/2023  Medication Sig   Calcium  Carb-Ergocalciferol  500-200 MG-UNIT TABS Take 2  tablets by mouth daily.    Cholecalciferol (VITAMIN D3) 2000 units capsule Take 2,000 Units by mouth daily.   denosumab  (PROLIA ) 60 MG/ML SOLN injection Inject 60 mg into the skin every 6 (six) months. Administer in upper arm, thigh, or abdomen   losartan  (COZAAR ) 50 MG tablet Take 1 tablet (50 mg total) by mouth daily.   NON FORMULARY Antronex dietary supplement 1 per day   NON FORMULARY Seneplex take 1 tablet once daily   NON FORMULARY Bio-C-Plus, take tablet by mouth once daily   vitamin B-12 (CYANOCOBALAMIN) 1000 MCG tablet Take 1,000 mcg by mouth daily.   Facility-Administered Encounter Medications as of 07/03/2023  Medication   denosumab  (PROLIA ) injection 60 mg    Review of Systems:  Review of Systems  Constitutional:  Negative for activity change and appetite change.  HENT: Negative.    Respiratory:  Negative for cough and shortness of breath.   Cardiovascular:  Negative for leg swelling.  Gastrointestinal:  Negative for constipation.  Genitourinary: Negative.   Musculoskeletal:  Negative for arthralgias, gait problem and myalgias.  Skin: Negative.   Neurological:  Negative for dizziness and weakness.  Psychiatric/Behavioral:  Negative for confusion, dysphoric mood and sleep disturbance.     Health Maintenance  Topic  Date Due   COVID-19 Vaccine (9 - 2024-25 season) 05/17/2023   Medicare Annual Wellness (AWV)  01/15/2024   MAMMOGRAM  03/01/2024   DTaP/Tdap/Td (3 - Td or Tdap) 10/11/2031   Pneumonia Vaccine 37+ Years old  Completed   INFLUENZA VACCINE  Completed   DEXA SCAN  Completed   Zoster Vaccines- Shingrix  Completed   HPV VACCINES  Aged Out   Colonoscopy  Discontinued    Physical Exam: Vitals:   07/03/23 0817  BP: 132/88  Pulse: 77  Resp: 17  Temp: 97.6 F (36.4 C)  TempSrc: Temporal  SpO2: 97%  Weight: 140 lb 3.2 oz (63.6 kg)  Height: 5' 1 (1.549 m)   Body mass index is 26.49 kg/m. Physical Exam Vitals reviewed.  Constitutional:       Appearance: Normal appearance.  HENT:     Head: Normocephalic.     Nose: Nose normal.     Mouth/Throat:     Mouth: Mucous membranes are moist.     Pharynx: Oropharynx is clear.  Eyes:     Pupils: Pupils are equal, round, and reactive to light.  Cardiovascular:     Rate and Rhythm: Normal rate and regular rhythm.     Pulses: Normal pulses.     Heart sounds: Normal heart sounds. No murmur heard. Pulmonary:     Effort: Pulmonary effort is normal.     Breath sounds: Normal breath sounds.  Abdominal:     General: Abdomen is flat. Bowel sounds are normal.     Palpations: Abdomen is soft.  Musculoskeletal:        General: No swelling.     Cervical back: Neck supple.  Skin:    General: Skin is warm.  Neurological:     General: No focal deficit present.     Mental Status: She is alert and oriented to person, place, and time.     Comments: Gait stable  Does not use Any assistance to walk  Psychiatric:        Mood and Affect: Mood normal.        Thought Content: Thought content normal.     Labs reviewed: Basic Metabolic Panel: Recent Labs    12/19/22 0000  NA 139  K 4.3  CL 103  CO2 23*  BUN 12  CREATININE 0.8  CALCIUM  9.3   Liver Function Tests: Recent Labs    12/19/22 0000  AST 27  ALT 23  ALKPHOS 62  ALBUMIN 3.8   No results for input(s): LIPASE, AMYLASE in the last 8760 hours. No results for input(s): AMMONIA in the last 8760 hours. CBC: No results for input(s): WBC, NEUTROABS, HGB, HCT, MCV, PLT in the last 8760 hours. Lipid Panel: Recent Labs    12/19/22 0000  CHOL 244*  HDL 74*  LDLCALC 154  TRIG 80   Lab Results  Component Value Date   HGBA1C 5.4 10/29/2014    Procedures since last visit: CT Maxillofacial Wo Contrast Result Date: 06/11/2023 CLINICAL DATA:  Fall.  Head injury.  Forehead laceration. EXAM: CT MAXILLOFACIAL WITHOUT CONTRAST TECHNIQUE: Multidetector CT imaging of the maxillofacial structures was performed.  Multiplanar CT image reconstructions were also generated. RADIATION DOSE REDUCTION: This exam was performed according to the departmental dose-optimization program which includes automated exposure control, adjustment of the mA and/or kV according to patient size and/or use of iterative reconstruction technique. COMPARISON:  None Available. FINDINGS: Osseous: No fracture or mandibular dislocation. No destructive process. Orbits: Negative. No traumatic or inflammatory finding.  Sinuses: No air-fluid levels. Soft tissues: Soft tissue swelling in the forehead. Limited intracranial: See head CT report IMPRESSION: No facial or orbital fracture. Electronically Signed   By: Franky Crease M.D.   On: 06/11/2023 20:17   CT Head Wo Contrast Result Date: 06/11/2023 CLINICAL DATA:  Head trauma, minor (Age >= 65y). Fall. Laceration to forehead. EXAM: CT HEAD WITHOUT CONTRAST TECHNIQUE: Contiguous axial images were obtained from the base of the skull through the vertex without intravenous contrast. RADIATION DOSE REDUCTION: This exam was performed according to the departmental dose-optimization program which includes automated exposure control, adjustment of the mA and/or kV according to patient size and/or use of iterative reconstruction technique. COMPARISON:  None Available. FINDINGS: Brain: Mild chronic small vessel disease throughout the deep white matter. No acute intracranial abnormality. Specifically, no hemorrhage, hydrocephalus, mass lesion, acute infarction, or significant intracranial injury. Vascular: No hyperdense vessel or unexpected calcification. Skull: No acute calvarial abnormality. Sinuses/Orbits: No acute findings Other: Soft tissue swelling in the forehead. IMPRESSION: 1. Mild chronic microvascular disease. 2. No acute intracranial abnormality. 3. Soft tissue swelling in the forehead. Electronically Signed   By: Franky Crease M.D.   On: 06/11/2023 20:15    Assessment/Plan 1. Fall, sequela  (Primary) Laceration Healed Her gait is stable She is upto date with her Eye exam Recommended Balance exercises here in [place]  2. Senile osteoporosis Prolia   3. Lymphedema of left leg Conservative management    Labs/tests ordered:  * No order type specified * Next appt:  07/20/2023

## 2023-07-05 ENCOUNTER — Telehealth: Payer: Self-pay | Admitting: *Deleted

## 2023-07-05 NOTE — Telephone Encounter (Signed)
Initiated Prior authorization through [facility] [place] was APPROVED 07/05/2023-07/04/2024 Prior Auth #: [id number]

## 2023-07-12 LAB — LIPID PANEL
Cholesterol: 260 — AB (ref 0–200)
HDL: 78 — AB (ref 35–70)
LDL Cholesterol: 160
LDl/HDL Ratio: 3.3
Triglycerides: 107 (ref 40–160)

## 2023-07-12 LAB — CBC AND DIFFERENTIAL
Hemoglobin: 12.9 (ref 12.0–16.0)
Platelets: 190 K/uL (ref 150–400)
WBC: 4.4

## 2023-07-12 LAB — COMPREHENSIVE METABOLIC PANEL
Albumin: 3.9 (ref 3.5–5.0)
Calcium: 9.6 (ref 8.7–10.7)
Globulin: 2.4
eGFR: 73

## 2023-07-12 LAB — BASIC METABOLIC PANEL
BUN: 13 (ref 4–21)
CO2: 24 — AB (ref 13–22)
Chloride: 105 (ref 99–108)
Creatinine: 0.8 (ref 0.5–1.1)
Glucose: 93
Potassium: 4 meq/L (ref 3.5–5.1)
Sodium: 142 (ref 137–147)

## 2023-07-12 LAB — HEPATIC FUNCTION PANEL
ALT: 21 U/L (ref 7–35)
AST: 23 (ref 13–35)
Alkaline Phosphatase: 72 (ref 25–125)
Bilirubin, Total: 0.6

## 2023-07-12 LAB — CBC: RBC: 4.39 (ref 3.87–5.11)

## 2023-07-20 ENCOUNTER — Ambulatory Visit: Payer: Medicare Other

## 2023-07-20 DIAGNOSIS — M810 Age-related osteoporosis without current pathological fracture: Secondary | ICD-10-CM

## 2023-07-20 MED ORDER — DENOSUMAB 60 MG/ML ~~LOC~~ SOSY
60.0000 mg | PREFILLED_SYRINGE | SUBCUTANEOUS | Status: AC
  Administered 2024-01-22: 60 mg via SUBCUTANEOUS

## 2023-09-20 ENCOUNTER — Telehealth: Payer: Self-pay

## 2023-09-20 NOTE — Telephone Encounter (Signed)
 No She does not Need Labs before her visit

## 2023-09-20 NOTE — Telephone Encounter (Signed)
 Left message on voicemail for patient to return call when available and sent to patient MyChart message

## 2023-09-20 NOTE — Telephone Encounter (Signed)
 Copied from CRM [id number]. Topic: Clinical - Request for Lab/Test Order >> Sep 20, 2023 11:15 AM Corin V wrote: Reason for CRM: Patient is wanting to verify if Dr. Chales Abrahams would like her to complete any labs prior to her appointment next Tuesday, 09/25/23. Please Advise   Message sent to Mahlon Gammon, MD

## 2023-09-25 ENCOUNTER — Encounter: Payer: Self-pay | Admitting: Internal Medicine

## 2023-09-25 ENCOUNTER — Non-Acute Institutional Stay: Payer: Medicare (Managed Care) | Admitting: Internal Medicine

## 2023-09-25 VITALS — BP 128/70 | HR 38 | Temp 98.8°F | Resp 20 | Ht 61.0 in | Wt 141.8 lb

## 2023-09-25 DIAGNOSIS — I890 Lymphedema, not elsewhere classified: Secondary | ICD-10-CM

## 2023-09-25 DIAGNOSIS — M270 Developmental disorders of jaws: Secondary | ICD-10-CM

## 2023-09-25 DIAGNOSIS — R001 Bradycardia, unspecified: Secondary | ICD-10-CM | POA: Insufficient documentation

## 2023-09-25 DIAGNOSIS — M810 Age-related osteoporosis without current pathological fracture: Secondary | ICD-10-CM

## 2023-09-25 DIAGNOSIS — I10 Essential (primary) hypertension: Secondary | ICD-10-CM

## 2023-09-25 DIAGNOSIS — E7800 Pure hypercholesterolemia, unspecified: Secondary | ICD-10-CM

## 2023-09-25 NOTE — Progress Notes (Signed)
 Location:  [facility] features editor of Service:  Clinic (12)  Provider:   Code Status:  Goals of Care:     07/03/2023    8:21 AM  Advanced Directives  Does Patient Have a Medical Advance Directive? Yes  Type of Estate agent of [employer];Out of facility DNR (pink MOST or yellow form);Living will  Does patient want to make changes to medical advance directive? No - Patient declined  Copy of Healthcare Power of Attorney in Chart? Yes - validated most recent copy scanned in chart (See row information)     Chief Complaint  Patient presents with   Follow-up    6 month follow up.    HPI: Patient is a 83 y.o. female seen today for medical management of chronic diseases.   [place]ives in [place] Husband   Patient has h/o Osteoporosis ON Prolia Left Lower leg Swelling since 05/21. [place] was negative Does have h/o Left Ankle Fracture in 2009 Dopplers Negative H/o Left knee Bakers Cyst and Bursitis per Ortho TMJ arthritis Caregiver stress takes care of her husband with dementia  Acute issue  Bradycardia When patient came in Nurses recorded her HR to be low and Irregular So EKG was ordered Patient was asymptomatic with it  Caregiver stress Patient takes care of her husband who has dementia She is going through Stress and wanted to talk . Wanted to discuss AL which they are not ready as they want to be together He independent in his ADLS but needs Cueing  Other wise patient continues to do well Has some Urinary Incontinence she manages well Gets Annual Eye exams No falls Does Balancing Exercises Now Past Medical History:  Diagnosis Date   Allergy    Ankle fracture, left    Baker cyst 04/19/2020   Bigeminy    Cataract    Colon polyps    Diverticulosis    FH: colon cancer    Menopausal and perimenopausal disorder    Multiple allergies    Osteoporosis 2008   PER DEXA SCAN   PVC (premature ventricular contraction)     Past Surgical  History:  Procedure Laterality Date   APPENDECTOMY     CATARACT EXTRACTION W/ INTRAOCULAR LENS  IMPLANT, BILATERAL     IR US GUIDE BX ASP/DRAIN  05/12/2020   PERFORATED APPENDIX     SMALL BOWEL OBSTRUCTION      Allergies  Allergen Reactions   Dust Mite Extract    Pollen Extract     Outpatient Encounter Medications as of 09/25/2023  Medication Sig   Calcium Carb-Ergocalciferol 500-200 MG-UNIT TABS Take 2 tablets by mouth daily.    Cholecalciferol (VITAMIN D3) 2000 units capsule Take 2,000 Units by mouth daily.   denosumab (PROLIA) 60 MG/ML SOLN injection Inject 60 mg into the skin every 6 (six) months. Administer in upper arm, thigh, or abdomen   losartan (COZAAR) 50 MG tablet Take 1 tablet (50 mg total) by mouth daily.   vitamin B-12 (CYANOCOBALAMIN) 1000 MCG tablet Take 1,000 mcg by mouth daily.   NON FORMULARY Antronex dietary supplement 1 per day (Patient not taking: Reported on 09/25/2023)   NON FORMULARY Seneplex take 1 tablet once daily (Patient not taking: Reported on 09/25/2023)   NON FORMULARY Bio-C-Plus, take tablet by mouth once daily (Patient not taking: Reported on 09/25/2023)   Facility-Administered Encounter Medications as of 09/25/2023  Medication   [START ON 01/18/2024] denosumab (PROLIA) injection 60 mg    Review of Systems:  Review of Systems  Constitutional:  Negative for activity change and appetite change.  HENT: Negative.    Respiratory:  Negative for cough and shortness of breath.   Cardiovascular:  Negative for leg swelling.  Gastrointestinal:  Negative for constipation.  Genitourinary: Negative.   Musculoskeletal:  Negative for arthralgias, gait problem and myalgias.  Skin: Negative.   Neurological:  Negative for dizziness and weakness.  Psychiatric/Behavioral:  Negative for confusion, dysphoric mood and sleep disturbance.     Health Maintenance  Topic Date Due   COVID-19 Vaccine (9 - 2024-25 season) 03/21/2024 (Originally 05/17/2023)   Medicare Annual  Wellness (AWV)  01/15/2024   INFLUENZA VACCINE  01/18/2024   MAMMOGRAM  03/01/2024   DTaP/Tdap/Td (3 - Td or Tdap) 10/11/2031   Pneumonia Vaccine 43+ Years old  Completed   DEXA SCAN  Completed   Zoster Vaccines- Shingrix  Completed   HPV VACCINES  Aged Out   Colonoscopy  Discontinued    Physical Exam: Vitals:   09/25/23 0820  BP: 128/70  Pulse: (!) 38  Resp: 20  Temp: 98.8 F (37.1 C)  SpO2: 99%  Weight: 141 lb 12.8 oz (64.3 kg)  Height: 5\' 1"  (1.549 m)   Body mass index is 26.79 kg/m. Physical Exam Vitals reviewed.  Constitutional:      Appearance: Normal appearance.  HENT:     Head: Normocephalic.     Right Ear: Tympanic membrane normal.     Left Ear: Tympanic membrane normal.     Nose: Nose normal.     Mouth/Throat:     Mouth: Mucous membranes are moist.     Pharynx: Oropharynx is clear.  Eyes:     Pupils: Pupils are equal, round, and reactive to light.  Cardiovascular:     Rate and Rhythm: Normal rate and regular rhythm.     Pulses: Normal pulses.     Heart sounds: Normal heart sounds. No murmur heard. Pulmonary:     Effort: Pulmonary effort is normal.     Breath sounds: Normal breath sounds.  Abdominal:     General: Abdomen is flat. Bowel sounds are normal.     Palpations: Abdomen is soft.  Musculoskeletal:        General: Swelling present.     Cervical back: Neck supple.  Skin:    General: Skin is warm.  Neurological:     General: No focal deficit present.     Mental Status: She is alert and oriented to person, place, and time.  Psychiatric:        Mood and Affect: Mood normal.        Thought Content: Thought content normal.     Labs reviewed: Basic Metabolic Panel: Recent Labs    12/19/22 0000 07/12/23 0000  NA 139 142  K 4.3 4.0  CL 103 105  CO2 23* 24*  BUN 12 13  CREATININE 0.8 0.8  CALCIUM 9.3 9.6   Liver Function Tests: Recent Labs    12/19/22 0000 07/12/23 0000  AST 27 23  ALT 23 21  ALKPHOS 62 72  ALBUMIN 3.8 3.9    No results for input(s): "LIPASE", "AMYLASE" in the last 8760 hours. No results for input(s): "AMMONIA" in the last 8760 hours. CBC: Recent Labs    07/12/23 0000  WBC 4.4  HGB 12.9  PLT 190   Lipid Panel: Recent Labs    12/19/22 0000 07/12/23 0000  CHOL 244* 260*  HDL 74* 78*  LDLCALC 154 160  TRIG 80 107  Lab Results  Component Value Date   HGBA1C 5.4 10/29/2014    Procedures since last visit: No results found.  Assessment/Plan 1. Senile osteoporosis (Primary) Prolia per [facility] Labs in July T score -2.6 in 2023  2. Lymphedema of left leg Dopplers negative Ted hoses  3. Torus palatinus Unchanged  4. Bradycardia EKG done Showed N sinus Rhythm at 64  5. Pure hypercholesterolemia Diet and Exercise  6. Essential hypertension, benign On Cozaar doing well 7 Caregiver stress Discussed different options to deal with it Given information about home care Also discussed about AL option    Labs/tests ordered:  Labs in July Next appt:  01/21/2024

## 2023-12-13 ENCOUNTER — Telehealth: Admitting: *Deleted

## 2023-12-13 NOTE — Telephone Encounter (Signed)
 Patient has an upcoming Prolia  injection and needs current labs done.   Please place orders and schedule with [facility] for patient to have done.

## 2023-12-13 NOTE — Telephone Encounter (Signed)
 Received [employer] Verification for Prolia .  [place], [place] APPROVED [facility] 07/05/2023-07/04/2024 PA #:[id number] Need updated labs. Message sent to Dr. Charlanne.

## 2023-12-25 NOTE — Telephone Encounter (Signed)
 Labs are Scheduled for 01/01/2024

## 2023-12-31 ENCOUNTER — Other Ambulatory Visit: Payer: Self-pay | Admitting: Internal Medicine

## 2024-01-01 LAB — HEPATIC FUNCTION PANEL
ALT: 19 U/L (ref 7–35)
ALT: 19 U/L (ref 7–35)
AST: 22 (ref 13–35)
AST: 22 (ref 13–35)
Alkaline Phosphatase: 63 (ref 25–125)
Bilirubin, Total: 0.6

## 2024-01-01 LAB — COMPREHENSIVE METABOLIC PANEL WITH GFR
Albumin: 3.7 (ref 3.5–5.0)
Albumin: 3.7 (ref 3.5–5.0)
Calcium: 9.5 (ref 8.7–10.7)
Calcium: 9.5 (ref 8.7–10.7)
Globulin: 2
eGFR: 86

## 2024-01-01 LAB — LIPID PANEL
Cholesterol: 239 — AB (ref 0–200)
Cholesterol: 239 — AB (ref 0–200)
HDL: 65 (ref 35–70)
HDL: 65 (ref 35–70)
LDL Cholesterol: 155
LDL Cholesterol: 155
LDl/HDL Ratio: 3.7
LDl/HDL Ratio: 3.7
Triglycerides: 95 (ref 40–160)
Triglycerides: 95 (ref 40–160)

## 2024-01-01 LAB — CBC: RBC: 4.3 (ref 3.87–5.11)

## 2024-01-01 LAB — CBC AND DIFFERENTIAL
HCT: 38 (ref 36–46)
Hemoglobin: 12.9 (ref 12.0–16.0)
Platelets: 168 K/uL (ref 150–400)
WBC: 4

## 2024-01-01 LAB — BASIC METABOLIC PANEL WITH GFR
BUN: 13 (ref 4–21)
BUN: 13 (ref 4–21)
CO2: 25 — AB (ref 13–22)
CO2: 25 — AB (ref 13–22)
Chloride: 104 (ref 99–108)
Chloride: 104 (ref 99–108)
Creatinine: 0.7 (ref 0.5–1.1)
Creatinine: 0.7 (ref 0.5–1.1)
Glucose: 95
Glucose: 95
Potassium: 4.1 meq/L (ref 3.5–5.1)
Potassium: 4.1 meq/L (ref 3.5–5.1)
Sodium: 139 (ref 137–147)
Sodium: 139 (ref 137–147)

## 2024-01-01 LAB — TSH: TSH: 1.99 (ref 0.41–5.90)

## 2024-01-03 ENCOUNTER — Other Ambulatory Visit: Payer: Self-pay

## 2024-01-21 ENCOUNTER — Non-Acute Institutional Stay: Payer: Medicare (Managed Care) | Admitting: Adult Health

## 2024-01-21 ENCOUNTER — Encounter: Payer: Self-pay | Admitting: Adult Health

## 2024-01-21 VITALS — BP 138/88 | HR 70 | Temp 98.7°F | Ht 61.0 in | Wt 143.8 lb

## 2024-01-21 DIAGNOSIS — E2839 Other primary ovarian failure: Secondary | ICD-10-CM

## 2024-01-21 DIAGNOSIS — Z0000 Encounter for general adult medical examination without abnormal findings: Secondary | ICD-10-CM | POA: Diagnosis not present

## 2024-01-21 DIAGNOSIS — Z1231 Encounter for screening mammogram for malignant neoplasm of breast: Secondary | ICD-10-CM | POA: Diagnosis not present

## 2024-01-21 NOTE — Patient Instructions (Signed)
 [name] , Thank you for taking time to come for your Medicare Wellness Visit. I appreciate your ongoing commitment to your health goals. Please review the following plan we discussed and let me know if I can assist you in the future.   Screening recommendations/referrals: Colonoscopy aged out Mammogram needed in Sept 2025 Bone Density Needed Sept 2025 Recommended yearly ophthalmology/optometry visit for glaucoma screening and checkup Recommended yearly dental visit for hygiene and checkup  Vaccinations: Influenza vaccine- due annually in September/October Pneumococcal vaccine done July 2025 Tdap vaccine up to date Shingles vaccine up to date    Advanced directives: reviewed   Conditions/risks identified: NA  Next appointment: 1 year   Preventive Care 45 Years and Older, Female Preventive care refers to lifestyle choices and visits with your health care provider that can promote health and wellness. What does preventive care include? A yearly physical exam. This is also called an annual well check. Dental exams once or twice a year. Routine eye exams. Ask your health care provider how often you should have your eyes checked. Personal lifestyle choices, including: Daily care of your teeth and gums. Regular physical activity. Eating a healthy diet. Avoiding tobacco and drug use. Limiting alcohol use. Practicing safe sex. Taking low-dose aspirin every day. Taking vitamin and mineral supplements as recommended by your health care provider. What happens during an annual well check? The services and screenings done by your health care provider during your annual well check will depend on your age, overall health, lifestyle risk factors, and family history of disease. Counseling  Your health care provider may ask you questions about your: Alcohol use. Tobacco use. Drug use. Emotional well-being. Home and relationship well-being. Sexual activity. Eating habits. History of  falls. Memory and ability to understand (cognition). Work and work Astronomer. Reproductive health. Screening  You may have the following tests or measurements: Height, weight, and BMI. Blood pressure. Lipid and cholesterol levels. These may be checked every 5 years, or more frequently if you are over 8 years old. Skin check. Lung cancer screening. You may have this screening every year starting at age 30 if you have a 30-pack-year history of smoking and currently smoke or have quit within the past 15 years. Fecal occult blood test (FOBT) of the stool. You may have this test every year starting at age 30. Flexible sigmoidoscopy or colonoscopy. You may have a sigmoidoscopy every 5 years or a colonoscopy every 10 years starting at age 58. Hepatitis C blood test. Hepatitis B blood test. Sexually transmitted disease (STD) testing. Diabetes screening. This is done by checking your blood sugar (glucose) after you have not eaten for a while (fasting). You may have this done every 1-3 years. Bone density scan. This is done to screen for osteoporosis. You may have this done starting at age 33. Mammogram. This may be done every 1-2 years. Talk to your health care provider about how often you should have regular mammograms. Talk with your health care provider about your test results, treatment options, and if necessary, the need for more tests. Vaccines  Your health care provider may recommend certain vaccines, such as: Influenza vaccine. This is recommended every year. Tetanus, diphtheria, and acellular pertussis (Tdap, Td) vaccine. You may need a Td booster every 10 years. Zoster vaccine. You may need this after age 51. Pneumococcal 13-valent conjugate (PCV13) vaccine. One dose is recommended after age 1. Pneumococcal polysaccharide (PPSV23) vaccine. One dose is recommended after age 46. Talk to your health care provider  about which screenings and vaccines you need and how often you need  them. This information is not intended to replace advice given to you by your health care provider. Make sure you discuss any questions you have with your health care provider. Document Released: 07/02/2015 Document Revised: 02/23/2016 Document Reviewed: 04/06/2015 Elsevier Interactive Patient Education  2017 [employer].  Fall Prevention in the Home Falls can cause injuries. They can happen to people of all ages. There are many things you can do to make your home safe and to help prevent falls. What can I do on the outside of my home? Regularly fix the edges of walkways and driveways and fix any cracks. Remove anything that might make you trip as you walk through a door, such as a raised step or threshold. Trim any bushes or trees on the path to your home. Use bright outdoor lighting. Clear any walking paths of anything that might make someone trip, such as rocks or tools. Regularly check to see if handrails are loose or broken. Make sure that both sides of any steps have handrails. Any raised decks and porches should have guardrails on the edges. Have any leaves, snow, or ice cleared regularly. Use sand or salt on walking paths during winter. Clean up any spills in your garage right away. This includes oil or grease spills. What can I do in the bathroom? Use night lights. Install grab bars by the toilet and in the tub and shower. Do not use towel bars as grab bars. Use non-skid mats or decals in the tub or shower. If you need to sit down in the shower, use a plastic, non-slip stool. Keep the floor dry. Clean up any water that spills on the floor as soon as it happens. Remove soap buildup in the tub or shower regularly. Attach bath mats securely with double-sided non-slip rug tape. Do not have throw rugs and other things on the floor that can make you trip. What can I do in the bedroom? Use night lights. Make sure that you have a light by your bed that is easy to reach. Do not use  any sheets or blankets that are too big for your bed. They should not hang down onto the floor. Have a firm chair that has side arms. You can use this for support while you get dressed. Do not have throw rugs and other things on the floor that can make you trip. What can I do in the kitchen? Clean up any spills right away. Avoid walking on wet floors. Keep items that you use a lot in easy-to-reach places. If you need to reach something above you, use a strong step stool that has a grab bar. Keep electrical cords out of the way. Do not use floor polish or wax that makes floors slippery. If you must use wax, use non-skid floor wax. Do not have throw rugs and other things on the floor that can make you trip. What can I do with my stairs? Do not leave any items on the stairs. Make sure that there are handrails on both sides of the stairs and use them. Fix handrails that are broken or loose. Make sure that handrails are as long as the stairways. Check any carpeting to make sure that it is firmly attached to the stairs. Fix any carpet that is loose or worn. Avoid having throw rugs at the top or bottom of the stairs. If you do have throw rugs, attach them to the floor  with carpet tape. Make sure that you have a light switch at the top of the stairs and the bottom of the stairs. If you do not have them, ask someone to add them for you. What else can I do to help prevent falls? Wear shoes that: Do not have high heels. Have rubber bottoms. Are comfortable and fit you well. Are closed at the toe. Do not wear sandals. If you use a stepladder: Make sure that it is fully opened. Do not climb a closed stepladder. Make sure that both sides of the stepladder are locked into place. Ask someone to hold it for you, if possible. Clearly mark and make sure that you can see: Any grab bars or handrails. First and last steps. Where the edge of each step is. Use tools that help you move around (mobility aids)  if they are needed. These include: Canes. Walkers. Scooters. Crutches. Turn on the lights when you go into a dark area. Replace any light bulbs as soon as they burn out. Set up your furniture so you have a clear path. Avoid moving your furniture around. If any of your floors are uneven, fix them. If there are any pets around you, be aware of where they are. Review your medicines with your doctor. Some medicines can make you feel dizzy. This can increase your chance of falling. Ask your doctor what other things that you can do to help prevent falls. This information is not intended to replace advice given to you by your health care provider. Make sure you discuss any questions you have with your health care provider. Document Released: 04/01/2009 Document Revised: 11/11/2015 Document Reviewed: 07/10/2014 Elsevier Interactive Patient Education  2017 [employer].

## 2024-01-21 NOTE — Progress Notes (Signed)
 Subjective:   Traci Woodard is a 83 y.o. female who presents for Medicare Annual (Subsequent) preventive examination at [facility]  Visit Complete: In person  Patient Medicare AWV questionnaire was completed by the patient on 01/21/24; I have confirmed that all information answered by patient is correct and no changes since this date.  Cardiac Risk Factors include: advanced age (>28men, >49 women);hypertension;dyslipidemia     Objective:    Today's Vitals   01/21/24 1318  BP: 138/88  Pulse: 70  Temp: 98.7 F (37.1 C)  SpO2: 98%  Weight: 143 lb 12.8 oz (65.2 kg)  Height: 5' 1 (1.549 m)   Body mass index is 27.17 kg/m.     01/21/2024    1:13 PM 07/03/2023    8:21 AM 06/11/2023    4:45 PM 03/27/2023   10:47 AM 01/15/2023    1:50 PM 12/26/2022   10:04 AM 06/26/2022    3:27 PM  Advanced Directives  Does Patient Have a Medical Advance Directive? Yes Yes No Yes Yes Yes Yes  Type of Estate agent of [place];Living will;Out of facility DNR (pink MOST or yellow form) Healthcare Power of [place];Out of facility DNR (pink MOST or yellow form);Living will  Out of facility DNR (pink MOST or yellow form) Healthcare Power of [place];Living will;Out of facility DNR (pink MOST or yellow form) Healthcare Power of [place];Living will;Out of facility DNR (pink MOST or yellow form) Living will;Out of facility DNR (pink MOST or yellow form);Healthcare Power of Attorney  Does patient want to make changes to medical advance directive? No - Patient declined No - Patient declined  No - Patient declined No - Patient declined No - Patient declined   Copy of Healthcare Power of Attorney in Chart? Yes - validated most recent copy scanned in chart (See row information) Yes - validated most recent copy scanned in chart (See row information)     Yes - validated most recent copy scanned in chart (See row information)  Would patient like information on  creating a medical advance directive?   No - Patient declined      Pre-existing out of facility DNR order (yellow form or pink MOST form) Pink MOST/Yellow Form most recent copy in chart - Physician notified to receive inpatient order          Current Medications (verified) Outpatient Encounter Medications as of 01/21/2024  Medication Sig   Calcium Carb-Ergocalciferol  500-200 MG-UNIT TABS Take 1 tablet by mouth daily.   Cholecalciferol (VITAMIN D3) 2000 units capsule Take 2,000 Units by mouth daily.   denosumab  (PROLIA ) 60 MG/ML SOLN injection Inject 60 mg into the skin every 6 (six) months. Administer in upper arm, thigh, or abdomen   losartan  (COZAAR ) 50 MG tablet Take 1 tablet (50 mg total) by mouth daily.   Multiple Vitamins-Minerals (CENTRUM SILVER 50+WOMEN PO) Take by mouth daily.   Multiple Vitamins-Minerals (PRESERVISION AREDS 2) CAPS Take 2 capsules by mouth daily.   vitamin B-12 (CYANOCOBALAMIN) 1000 MCG tablet Take 1,000 mcg by mouth daily.   Facility-Administered Encounter Medications as of 01/21/2024  Medication   denosumab  (PROLIA ) injection 60 mg    Allergies (verified) Dust mite extract and Pollen extract   History: Past Medical History:  Diagnosis Date   Allergy    Ankle fracture, left    Baker cyst 04/19/2020   Bigeminy    Cataract    Colon polyps    Diverticulosis    FH: colon cancer  Menopausal and perimenopausal disorder    Multiple allergies    Osteoporosis 2008   PER DEXA SCAN   PVC (premature ventricular contraction)    Past Surgical History:  Procedure Laterality Date   APPENDECTOMY     CATARACT EXTRACTION W/ INTRAOCULAR LENS  IMPLANT, BILATERAL     IR US  GUIDE BX ASP/DRAIN  05/12/2020   PERFORATED APPENDIX     SMALL BOWEL OBSTRUCTION     Family History  Problem Relation Age of Onset   Colon cancer Mother    CAD Father    Dementia Father    Heart disease Father    Colon cancer Brother    Lung cancer Brother    Breast cancer Paternal Aunt     Social History   Socioeconomic History   Marital status: Married    Spouse name: Not on file   Number of children: Not on file   Years of education: Not on file   Highest education level: Not on file  Occupational History   Occupation: Retired    Comment: Programmer, systems  Tobacco Use   Smoking status: Never   Smokeless tobacco: Never  Vaping Use   Vaping status: Never Used  Substance and Sexual Activity   Alcohol use: Yes    Comment: occassiionally   Drug use: No   Sexual activity: Not on file  Other Topics Concern   Not on file  Social History Narrative   Tobacco use, amount per day now:0   Past tobacco use, amount per day:0   How many years did you use tobacco:0   Alcohol use (drinks per week):7-14   Diet:FRUITS, VEGETABLES, YOGURT, LEAN MEAT AND FISH, FEW BREADS AND DESSERTS    Do you drink/eat things with caffeine:YES, COFFEE, CHOCOLATE   Marital status: MARRIED                               What year were you married? 1980   Do you live in a house, apartment, assisted living, condo, trailer, etc.? GARDEN HOME   Is it one or more stories? ONE   How many persons live in your home? 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   YES                               Type and how often? WALK AND STRETCH DAILY   Do you have a living will? YES   Do you have a DNR form?  I THINK SO              If not, do you want to discuss one?   Do you have signed POA/HPOA forms?  YES                      If so, please bring to you appointment   Social Drivers of Health   Financial Resource Strain: Low Risk  (02/26/2018)   Overall Financial Resource Strain (CARDIA)    Difficulty of Paying Living Expenses: Not hard at all  Food Insecurity: No Food Insecurity (02/26/2018)   Hunger Vital Sign    Worried About  Running Out of Food  in the Last Year: Never true    Ran Out of Food in the Last Year: Never true  Transportation Needs: No Transportation Needs (02/26/2018)   PRAPARE - Administrator, Civil Service (Medical): No    Lack of Transportation (Non-Medical): No  Physical Activity: Insufficiently Active (02/26/2018)   Exercise Vital Sign    Days of Exercise per Week: 7 days    Minutes of Exercise per Session: 20 min  Stress: No Stress Concern Present (02/26/2018)   [employer] of Occupational Health - Occupational Stress Questionnaire    Feeling of Stress : Only a little  Social Connections: Moderately Integrated (02/26/2018)   Social Connection and Isolation Panel    Frequency of Communication with Friends and Family: More than three times a week    Frequency of Social Gatherings with Friends and Family: More than three times a week    Attends Religious Services: More than 4 times per year    Active Member of [employer] or Organizations: No    Attends Engineer, structural: Never    Marital Status: Married    Tobacco Counseling Counseling given: Not Answered   Clinical Intake:  Pre-visit preparation completed: No  Pain : No/denies pain     BMI - recorded: 27.17 Nutritional Status: BMI 25 -29 Overweight Nutritional Risks: None Diabetes: No  How often do you need to have someone help you when you read instructions, pamphlets, or other written materials from your doctor or pharmacy?: 1 - Never What is the last grade level you completed in school?: College  Interpreter Needed?: No  Information entered by :: Tawni America NP   Activities of Daily Living    01/21/2024    1:27 PM  In your present state of health, do you have any difficulty performing the following activities:  Hearing? 0  Vision? 1  Difficulty concentrating or making decisions? 0  Walking or climbing stairs? 0  Dressing or bathing? 0  Doing errands, shopping? 0  Preparing Food and eating  ? N  Using the Toilet? N  In the past six months, have you accidently leaked urine? Y  Do you have problems with loss of bowel control? N  Managing your Medications? N  Managing your Finances? Y  Housekeeping or managing your Housekeeping? N    Patient Care Team: Charlanne Fredia CROME, MD as PCP - General (Internal Medicine) Ivin Kocher, MD as Consulting Physician (Dermatology) Vicci Gladis POUR, MD as Consulting Physician (Gastroenterology) Frutoso Luz, MD as Referring Physician (Allergy) Shlomo Wilbert SAUNDERS, MD as Consulting Physician (Cardiology) Leslee Reusing, MD as Consulting Physician (Ophthalmology)  Indicate any recent Medical Services you may have received from other than [facility] providers in the past year (date may be approximate).     Assessment:   This is a routine wellness examination for Ten[place]ia.  Hearing/Vision screen Hearing Screening - Comments:: No issues with hearing.   Goals Addressed             This Visit's Progress    Patient Stated       Exercise 3-5 times a week        Depression Screen    01/21/2024    1:11 PM 07/03/2023    8:21 AM 03/27/2023   10:48 AM 01/15/2023    1:50 PM 12/26/2022   10:04 AM 06/26/2022    3:26 PM 05/09/2022    3:21 PM  PHQ 2/9 Scores  PHQ - 2 Score 0 0 0 0 0 0 0  Fall Risk    01/21/2024    1:10 PM 07/03/2023    8:21 AM 03/27/2023   10:48 AM 01/15/2023    1:49 PM 12/26/2022   10:04 AM  Fall Risk   Falls in the past year? 1 1 0 0 0  Number falls in past yr: 0 0 0 0 0  Injury with Fall? 1 1 0 0 0  Risk for fall due to : No Fall Risks History of fall(s)  No Fall Risks No Fall Risks  Follow up Falls evaluation completed Falls evaluation completed  Falls evaluation completed Falls evaluation completed    MEDICARE RISK AT HOME: Medicare Risk at Home Any stairs in or around the home?: No If so, are there any without handrails?: No Home free of loose throw rugs in walkways, pet beds, electrical cords, etc?: Yes Adequate  lighting in your home to reduce risk of falls?: Yes Life alert?: No Use of a cane, walker or w/c?: No Grab bars in the bathroom?: Yes Shower chair or bench in shower?: Yes Elevated toilet seat or a handicapped toilet?: Yes  TIMED UP AND GO:  Was the test performed?  Yes  Length of time to ambulate 10 feet: 10 sec Gait steady and fast without use of assistive device    Cognitive Function:    01/15/2023    1:56 PM 10/11/2021    8:17 AM 02/26/2018    3:46 PM 01/17/2017    9:04 AM 02/16/2016   10:21 AM  MMSE - Mini Mental State Exam  Orientation to time 5 5 5 5  5    Orientation to Place 5 5 5 5  5    Registration 3 3 3 3  3    Attention/ Calculation 5 5 5 5  5    Recall 3 3 3 2  3    Language- name 2 objects 2 2 2 2  2    Language- repeat 1 1 1 1 1   Language- follow 3 step command 3 3 3 3  3    Language- read & follow direction 1 1 1 1  1    Write a sentence 1 1 1 1  1    Copy design 1 1 1 1  1    Total score 30 30 30 29  30       Data saved with a previous flowsheet row definition        01/21/2024    1:15 PM 06/17/2020    9:01 AM 04/17/2019    1:13 PM  6CIT Screen  What Year? 0 points 0 points 0 points  What month? 0 points 0 points 0 points  What time? 0 points 0 points 0 points  Count back from 20 0 points 0 points 0 points  Months in reverse 0 points 0 points 0 points  Repeat phrase 0 points 0 points 0 points  Total Score 0 points 0 points 0 points    Immunizations Immunization History  Administered Date(s) Administered   Fluad  Quad(high Dose 65+) 03/20/2022   Influenza, High Dose Seasonal PF 01/18/2015, 03/28/2019, 04/16/2020   Influenza,inj,Quad PF,6+ Mos 04/24/2016, 04/12/2018   Influenza-Unspecified 04/09/2017, 04/16/2020, 02/17/2021, 04/08/2021, 03/22/2023   Moderna Covid-19 Fall Seasonal Vaccine 67yrs & older 10/12/2022   Moderna Covid-19 Vaccine Bivalent Booster 37yrs & up 04/24/2022   Moderna SARS-COV2 Booster Vaccination 10/19/2020, 04/01/2021   Moderna  Sars-Covid-2 Vaccination 07/01/2019, 07/29/2019, 05/04/2020, 03/22/2023   Pneumococcal Conjugate-13 09/22/2013   Pneumococcal Polysaccharide-23 09/02/2004, 09/18/2011   RSV,unspecified 06/07/2022   Tdap 09/18/2011, 10/10/2021  Unspecified SARS-COV-2 Vaccination 03/22/2023   Zoster Recombinant(Shingrix) 08/21/2017, 11/12/2017   Zoster, Live 08/19/2008    TDAP status: Up to date  Flu Vaccine status: Up to date  Pneumococcal vaccine status: Up to date  Covid-19 vaccine status: Completed vaccines  Qualifies for Shingles Vaccine? Yes   Zostavax completed Yes   Shingrix Completed?: Yes  Screening Tests Health Maintenance  Topic Date Due   INFLUENZA VACCINE  01/18/2024   COVID-19 Vaccine (9 - 2024-25 season) 03/21/2024 (Originally 05/17/2023)   MAMMOGRAM  03/01/2024   Medicare Annual Wellness (AWV)  01/20/2025   DTaP/Tdap/Td (3 - Td or Tdap) 10/11/2031   Pneumococcal Vaccine: 50+ Years  Completed   DEXA SCAN  Completed   Zoster Vaccines- Shingrix  Completed   Hepatitis B Vaccines  Aged Out   HPV VACCINES  Aged Out   Meningococcal B Vaccine  Aged Out   Colonoscopy  Discontinued    Health Maintenance  Health Maintenance Due  Topic Date Due   INFLUENZA VACCINE  01/18/2024    Colorectal cancer screening: No longer required.   Mammogram status: Completed 03/02/23. Repeat every year  Bone Density status: Completed 02/2023. Results reflect: Bone density results: OSTEOPOROSIS. Repeat every 2 years.  Lung Cancer Screening: (Low Dose CT Chest recommended if Age 90-80 years, 20 pack-year currently smoking OR have quit w/in 15years.) does not qualify.   Lung Cancer Screening Referral: NA  Additional Screening:  Hepatitis C Screening: does not qualify; Completed NA  Vision Screening: Recommended annual ophthalmology exams for early detection of glaucoma and other disorders of the eye. Is the patient up to date with their annual eye exam?  Yes  Who is the provider or what is  the name of the office in which the patient attends annual eye exams? [email or web address] If pt is not established with a provider, would they like to be referred to a provider to establish care? No .   Dental Screening: Recommended annual dental exams for proper oral hygiene  Diabetic Foot Exam: NA  Community Resource Referral / Chronic Care Management: CRR required this visit?  No   CCM required this visit?  No     Plan:     I have personally reviewed and noted the following in the patient's chart:   Medical and social history Use of alcohol, tobacco or illicit drugs  Current medications and supplements including opioid prescriptions. Patient is not currently taking opioid prescriptions. Functional ability and status Nutritional status Physical activity Advanced directives List of other physicians Hospitalizations, surgeries, and ER visits in previous 12 months Vitals Screenings to include cognitive, depression, and falls Referrals and appointments  In addition, I have reviewed and discussed with patient certain preventive protocols, quality metrics, and best practice recommendations. A written personalized care plan for preventive services as well as general preventive health recommendations were provided to patient.     Tawni America, NP   01/21/2024   After Visit Summary: (In Person-Printed) AVS printed and given to the patient  Nurse Notes: NA

## 2024-01-22 ENCOUNTER — Ambulatory Visit (INDEPENDENT_AMBULATORY_CARE_PROVIDER_SITE_OTHER): Payer: Medicare Other | Admitting: Adult Health

## 2024-01-22 DIAGNOSIS — M810 Age-related osteoporosis without current pathological fracture: Secondary | ICD-10-CM

## 2024-01-22 MED ORDER — DENOSUMAB 60 MG/ML ~~LOC~~ SOSY
60.0000 mg | PREFILLED_SYRINGE | SUBCUTANEOUS | Status: AC
Start: 2024-06-23 — End: ?

## 2024-01-22 NOTE — Progress Notes (Signed)
 Patient received prolia  injection in left arm and tolerated well.

## 2024-03-03 ENCOUNTER — Ambulatory Visit: Payer: Medicare (Managed Care)

## 2024-03-05 ENCOUNTER — Ambulatory Visit
Admission: RE | Admit: 2024-03-05 | Discharge: 2024-03-05 | Disposition: A | Discharge status: Home / Self Care | Payer: Medicare (Managed Care) | Source: Ambulatory Visit | Attending: Adult Health | Admitting: Adult Health

## 2024-03-05 DIAGNOSIS — Z1231 Encounter for screening mammogram for malignant neoplasm of breast: Secondary | ICD-10-CM

## 2024-03-25 ENCOUNTER — Non-Acute Institutional Stay: Admitting: Internal Medicine

## 2024-03-25 ENCOUNTER — Encounter: Payer: Self-pay | Admitting: Internal Medicine

## 2024-03-25 VITALS — BP 118/84 | HR 70 | Temp 98.5°F | Ht 61.0 in | Wt 141.6 lb

## 2024-03-25 DIAGNOSIS — M270 Developmental disorders of jaws: Secondary | ICD-10-CM

## 2024-03-25 DIAGNOSIS — E785 Hyperlipidemia, unspecified: Secondary | ICD-10-CM | POA: Diagnosis not present

## 2024-03-25 DIAGNOSIS — I10 Essential (primary) hypertension: Secondary | ICD-10-CM

## 2024-03-25 DIAGNOSIS — R001 Bradycardia, unspecified: Secondary | ICD-10-CM

## 2024-03-25 DIAGNOSIS — I890 Lymphedema, not elsewhere classified: Secondary | ICD-10-CM

## 2024-03-25 DIAGNOSIS — M810 Age-related osteoporosis without current pathological fracture: Secondary | ICD-10-CM | POA: Diagnosis not present

## 2024-03-25 MED ORDER — ROSUVASTATIN CALCIUM 5 MG PO TABS: 5.0000 mg | ORAL_TABLET | Freq: Every day | ORAL | 3 refills | Status: AC

## 2024-03-25 NOTE — Progress Notes (Signed)
 Location:  [facility] features editor of Service:  Clinic (12)  Provider:   Code Status:  Goals of Care:     01/21/2024    1:13 PM  Advanced Directives  Does Patient Have a Medical Advance Directive? Yes  Type of Estate agent of [place];Living will;Out of facility DNR (pink MOST or yellow form)  Does patient want to make changes to medical advance directive? No - Patient declined  Copy of Healthcare Power of Attorney in Chart? Yes - validated most recent copy scanned in chart (See row information)  Pre-existing out of facility DNR order (yellow form or pink MOST form) Pink MOST/Yellow Form most recent copy in chart - Physician notified to receive inpatient order     Chief Complaint  Patient presents with   Follow-up    6 month follow up    HPI: Patient is a 83 y.o. female seen today for medical management of chronic diseases.    [place]ives in [place] Husband   Patient has h/o Osteoporosis ON Prolia  Left Lower leg Swelling since 05/21. US  was negative Does have h/o Left Ankle Fracture in 2009 Dopplers Negative H/o Left knee Bakers Cyst and Bursitis per Ortho TMJ arthritis Bradycardia Showed N sinus Rhythm at 64  Discussed the use of AI scribe software for clinical note transcription with the patient, who gave verbal consent to proceed.  History of Present Illness   Traci Woodard is an 83 year old female who presents for a routine follow-up visit.  Her leg condition remains stable with no significant pain. She is on Prolia  for osteoporosis with no adverse reactions. Her cholesterol management needs improvement, and she attributes her lack of exercise to her responsibilities with [employer].  She is current with flu and COVID vaccinations, having received them last week without significant reactions.  She has an eye condition involving a hemorrhage and is scheduled for a procedure with Dr. Tobie. She uses eye drops and feels secure driving but  has difficulty reading.  She experiences urinary urgency, wearing a pad most days and a diaper on Sundays for convenience. She walks occasionally but not as much as desired due to her responsibilities.      Past Medical History:  Diagnosis Date   Allergy    Ankle fracture, left    Baker cyst 04/19/2020   Bigeminy    Cataract    Colon polyps    Diverticulosis    FH: colon cancer    Menopausal and perimenopausal disorder    Multiple allergies    Osteoporosis 2008   PER DEXA SCAN   PVC (premature ventricular contraction)     Past Surgical History:  Procedure Laterality Date   APPENDECTOMY     CATARACT EXTRACTION W/ INTRAOCULAR LENS  IMPLANT, BILATERAL     IR US  GUIDE BX ASP/DRAIN  05/12/2020   PERFORATED APPENDIX     SMALL BOWEL OBSTRUCTION      Allergies  Allergen Reactions   Dust Mite Extract    Pollen Extract     Outpatient Encounter Medications as of 03/25/2024  Medication Sig   Calcium Carb-Ergocalciferol  500-200 MG-UNIT TABS Take 1 tablet by mouth daily.   Cholecalciferol (VITAMIN D3) 2000 units capsule Take 2,000 Units by mouth daily.   denosumab  (PROLIA ) 60 MG/ML SOLN injection Inject 60 mg into the skin every 6 (six) months. Administer in upper arm, thigh, or abdomen   losartan  (COZAAR ) 50 MG tablet Take 1 tablet (50 mg total) by  mouth daily.   Multiple Vitamins-Minerals (CENTRUM SILVER 50+WOMEN PO) Take by mouth daily.   Multiple Vitamins-Minerals (PRESERVISION AREDS 2) CAPS Take 2 capsules by mouth daily.   rosuvastatin (CRESTOR) 5 MG tablet Take 1 tablet (5 mg total) by mouth daily.   vitamin B-12 (CYANOCOBALAMIN) 1000 MCG tablet Take 1,000 mcg by mouth daily.   Facility-Administered Encounter Medications as of 03/25/2024  Medication   [START ON 06/23/2024] denosumab  (PROLIA ) injection 60 mg    Review of Systems:  Review of Systems  Constitutional:  Negative for activity change and appetite change.  HENT: Negative.    Respiratory:  Negative for cough and  shortness of breath.   Cardiovascular:  Positive for leg swelling.  Gastrointestinal:  Negative for constipation.  Genitourinary: Negative.   Musculoskeletal:  Positive for gait problem. Negative for arthralgias and myalgias.  Skin: Negative.   Neurological:  Negative for dizziness and weakness.  Psychiatric/Behavioral:  Negative for confusion, dysphoric mood and sleep disturbance.     Health Maintenance  Topic Date Due   COVID-19 Vaccine (10 - 2024-25 season) 09/15/2024   Medicare Annual Wellness (AWV)  01/20/2025   Mammogram  03/05/2025   DTaP/Tdap/Td (3 - Td or Tdap) 10/11/2031   Pneumococcal Vaccine: 50+ Years  Completed   Influenza Vaccine  Completed   DEXA SCAN  Completed   Zoster Vaccines- Shingrix  Completed   Meningococcal B Vaccine  Aged Out   Colonoscopy  Discontinued    Physical Exam: Vitals:   03/25/24 0820  BP: 118/84  Pulse: 70  Temp: 98.5 F (36.9 C)  SpO2: 97%  Weight: 141 lb 9.6 oz (64.2 kg)  Height: 5' 1 (1.549 m)   Body mass index is 26.76 kg/m. Physical Exam Vitals reviewed.  Constitutional:      Appearance: Normal appearance.  HENT:     Head: Normocephalic.     Nose: Nose normal.     Mouth/Throat:     Mouth: Mucous membranes are moist.     Pharynx: Oropharynx is clear.  Eyes:     Pupils: Pupils are equal, round, and reactive to light.  Cardiovascular:     Rate and Rhythm: Normal rate and regular rhythm.     Pulses: Normal pulses.     Heart sounds: Normal heart sounds. No murmur heard. Pulmonary:     Effort: Pulmonary effort is normal.     Breath sounds: Normal breath sounds.  Abdominal:     General: Abdomen is flat. Bowel sounds are normal.     Palpations: Abdomen is soft.  Musculoskeletal:        General: No swelling.     Cervical back: Neck supple.     Comments: Left Leg swelling   Skin:    General: Skin is warm.  Neurological:     General: No focal deficit present.     Mental Status: She is alert and oriented to person,  place, and time.  Psychiatric:        Mood and Affect: Mood normal.        Thought Content: Thought content normal.     Labs reviewed: Basic Metabolic Panel: Recent Labs    07/12/23 0000 01/01/24 0000 01/01/24 1453  NA 142 139  139  --   K 4.0 4.1  4.1  --   CL 105 104  104  --   CO2 24* 25*  25*  --   BUN 13 13  13   --   CREATININE 0.8 0.7  0.7  --  CALCIUM 9.6 9.5 9.5  TSH  --  1.99  --    Liver Function Tests: Recent Labs    07/12/23 0000 01/01/24 0000 01/01/24 1453  AST 23 22  22   --   ALT 21 19  19   --   ALKPHOS 72 63  --   ALBUMIN 3.9 3.7 3.7   No results for input(s): LIPASE, AMYLASE in the last 8760 hours. No results for input(s): AMMONIA in the last 8760 hours. CBC: Recent Labs    07/12/23 0000 01/01/24 0000  WBC 4.4 4.0  HGB 12.9 12.9  HCT  --  38  PLT 190 168   Lipid Panel: Recent Labs    07/12/23 0000 01/01/24 0000  CHOL 260* 239*  239*  HDL 78* 65  65  LDLCALC 160 155  155  TRIG 107 95  95   Lab Results  Component Value Date   HGBA1C 5.4 10/29/2014    Procedures since last visit: MM 3D SCREENING MAMMOGRAM BILATERAL BREAST Result Date: 03/07/2024 CLINICAL DATA:  Screening. EXAM: DIGITAL SCREENING BILATERAL MAMMOGRAM WITH TOMOSYNTHESIS AND CAD TECHNIQUE: Bilateral screening digital craniocaudal and mediolateral oblique mammograms were obtained. Bilateral screening digital breast tomosynthesis was performed. The images were evaluated with computer-aided detection. COMPARISON:  Previous exam(s). ACR Breast Density Category b: There are scattered areas of fibroglandular density. FINDINGS: There are no findings suspicious for malignancy. IMPRESSION: No mammographic evidence of malignancy. A result letter of this screening mammogram will be mailed directly to the patient. RECOMMENDATION: Screening mammogram in one year. (Code:SM-B-01Y) BI-RADS CATEGORY  1: Negative. Electronically Signed   By: Norleen Croak M.D.   On: 03/07/2024  08:09    Assessment/Plan 1. Hyperlipidemia, unspecified hyperlipidemia type (Primary) She wants to try Statin Crestor 5 mg QD  2. Senile osteoporosis Prolia  per Office Next due in Feb T score -2.6 in 2023  Labs Before 3. Torus palatinus stable  4. Lymphedema of left leg Stable  5. Bradycardia EKG in Past has shown Sinus Ry[place]  6. Essential hypertension, benign Cozaar     Labs/tests ordered:  Labs ordered Next appt:  07/28/2024

## 2024-06-24 ENCOUNTER — Telehealth: Payer: Self-pay | Admitting: *Deleted

## 2024-06-24 NOTE — Telephone Encounter (Signed)
 Verification sent for Prolia  to [employer] Awaiting Verification.  Last Injection 01/22/24   Needs Labs done before appointment 07/28/24 message sent to Dr. Charlanne

## 2024-06-25 NOTE — Telephone Encounter (Signed)
 Received [employer] Verification. No Copay, Needs Prior Authorization. Will Obtain through [facility]

## 2024-06-26 NOTE — Telephone Encounter (Signed)
 Prior Authorization APPROVED  Auth #: [id number] 06/26/24-06/26/25

## 2024-07-01 NOTE — Telephone Encounter (Signed)
 She will get labs on 07/22/2024

## 2024-07-18 ENCOUNTER — Telehealth: Payer: Self-pay

## 2024-07-18 MED ORDER — LOSARTAN POTASSIUM 50 MG PO TABS: 50.0000 mg | ORAL_TABLET | Freq: Every day | ORAL | 1 refills | Status: AC

## 2024-07-18 NOTE — Telephone Encounter (Signed)
 Copied from CRM #[id number]. Topic: Clinical - Medication Refill >> Jul 18, 2024 11:10 AM Carrielelia G wrote: Medication: losartan  (COZAAR ) 50 MG table  Has the patient contacted their pharmacy? Yes (Agent: If no, request that the patient contact the pharmacy for the refill. If patient does not wish to contact the pharmacy document the reason why and proceed with request.) (Agent: If yes, when and what did the pharmacy advise?)  This is the patient's preferred pharmacy:  [facility] [id number] - [place], [place] - [address] [place] [address] Phone: [phone] Fax: [phone]  Is this the correct pharmacy for this prescription? Yes If no, delete pharmacy and type the correct one.    Is the patient out of the medication? No  Has the patient been seen for an appointment in the last year OR does the patient have an upcoming appointment? Yes  Can we respond through MyChart? No  Agent: Please be advised that Rx refills may take up to 3 business days. We ask that you follow-up with your pharmacy.

## 2024-07-18 NOTE — Telephone Encounter (Signed)
 Spoke with patient to notified the medication has sent to pharmacy as requested.

## 2024-07-22 LAB — LIPID PANEL
Cholesterol: 182 (ref 0–200)
HDL: 79 — AB (ref 35–70)
LDL Cholesterol: 84
LDl/HDL Ratio: 2.3
Triglycerides: 93 (ref 40–160)

## 2024-07-22 LAB — CBC: RBC: 4.28 (ref 3.87–5.11)

## 2024-07-22 LAB — BASIC METABOLIC PANEL WITH GFR
BUN: 17 (ref 4–21)
CO2: 23 — AB (ref 13–22)
Chloride: 106 (ref 99–108)
Creatinine: 0.7 (ref 0.5–1.1)
Glucose: 94
Potassium: 4.2 meq/L (ref 3.5–5.1)
Sodium: 141 (ref 137–147)

## 2024-07-22 LAB — HEPATIC FUNCTION PANEL
ALT: 22 U/L (ref 7–35)
AST: 23 (ref 13–35)
Alkaline Phosphatase: 66 (ref 25–125)
Bilirubin, Total: 0.5

## 2024-07-22 LAB — COMPREHENSIVE METABOLIC PANEL WITH GFR
Albumin: 3.8 (ref 3.5–5.0)
Calcium: 9.3 (ref 8.7–10.7)
Globulin: 2.2
eGFR: 80

## 2024-07-22 LAB — CBC AND DIFFERENTIAL
HCT: 38 (ref 36–46)
Hemoglobin: 12.7 (ref 12.0–16.0)
Platelets: 160 10*3/uL (ref 150–400)
WBC: 4.1

## 2024-07-28 ENCOUNTER — Encounter: Payer: Self-pay | Admitting: Adult Health

## 2024-07-28 ENCOUNTER — Ambulatory Visit

## 2024-07-28 ENCOUNTER — Ambulatory Visit: Payer: Self-pay

## 2024-10-09 ENCOUNTER — Other Ambulatory Visit (HOSPITAL_BASED_OUTPATIENT_CLINIC_OR_DEPARTMENT_OTHER): Payer: Medicare (Managed Care)
